# Patient Record
Sex: Female | Born: 1950 | Race: Black or African American | Hispanic: No | Marital: Single | Smoking: Never smoker
Health system: Southern US, Community
[De-identification: ages and names within clinical notes are randomized; demographics above are authoritative.]

## PROBLEM LIST (undated history)

## (undated) DIAGNOSIS — G360 Neuromyelitis optica [Devic]: Secondary | ICD-10-CM

## (undated) DIAGNOSIS — E119 Type 2 diabetes mellitus without complications: Secondary | ICD-10-CM

## (undated) DIAGNOSIS — I10 Essential (primary) hypertension: Secondary | ICD-10-CM

## (undated) DIAGNOSIS — I2699 Other pulmonary embolism without acute cor pulmonale: Secondary | ICD-10-CM

## (undated) DIAGNOSIS — M3500 Sicca syndrome, unspecified: Secondary | ICD-10-CM

## (undated) DIAGNOSIS — G8220 Paraplegia, unspecified: Secondary | ICD-10-CM

## (undated) DIAGNOSIS — G709 Myoneural disorder, unspecified: Secondary | ICD-10-CM

## (undated) HISTORY — PX: TUBAL LIGATION: SHX77

## (undated) HISTORY — PX: EYE SURGERY: SHX253

## (undated) HISTORY — PX: STOMACH SURGERY: SHX791

## (undated) HISTORY — PX: PULMONARY EMBOLISM SURGERY: SHX752

---

## 2009-08-28 ENCOUNTER — Ambulatory Visit: Payer: Self-pay | Admitting: Orthopedic Surgery

## 2011-06-10 ENCOUNTER — Ambulatory Visit: Payer: Self-pay | Admitting: General Practice

## 2011-07-17 ENCOUNTER — Ambulatory Visit: Payer: Self-pay | Admitting: General Practice

## 2011-07-28 ENCOUNTER — Ambulatory Visit: Payer: Self-pay | Admitting: General Practice

## 2011-08-10 ENCOUNTER — Ambulatory Visit: Payer: Self-pay | Admitting: Oncology

## 2011-08-15 ENCOUNTER — Ambulatory Visit: Payer: Self-pay | Admitting: Oncology

## 2011-08-22 ENCOUNTER — Ambulatory Visit: Payer: Self-pay | Admitting: Internal Medicine

## 2011-09-15 ENCOUNTER — Ambulatory Visit: Payer: Self-pay | Admitting: Oncology

## 2011-10-15 ENCOUNTER — Ambulatory Visit: Payer: Self-pay | Admitting: Oncology

## 2011-11-15 IMAGING — CT NM PET TUM IMG LTD AREA
1 of 5 series · 8 of 25 positions shown · non-contrast
Comparison: none

REASON FOR EXAM: pulmonary nodule
COMMENTS:

[Series 3: ct wb 3.0 b30f · axial · 3.0mm · 0.98mm/px · z∈[-1396,-702]mm · 8 of 435 slices shown]
[im 44/435  soft-tissue]
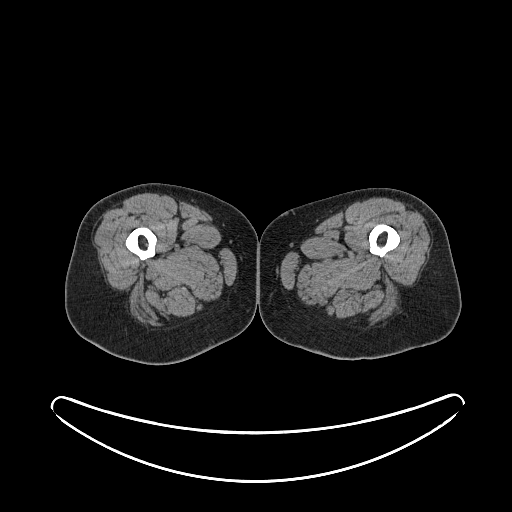
[im 87/435  soft-tissue]
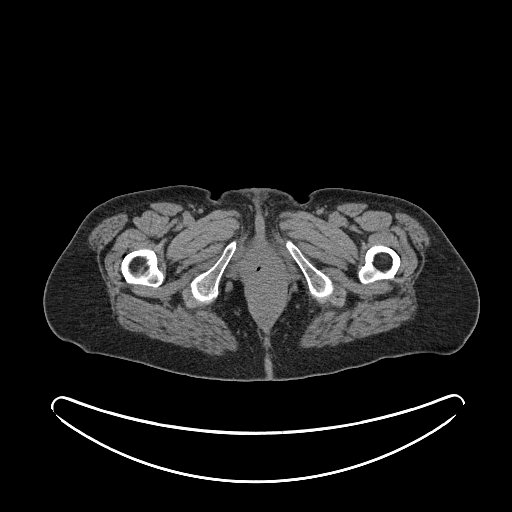
[im 131/435  soft-tissue]
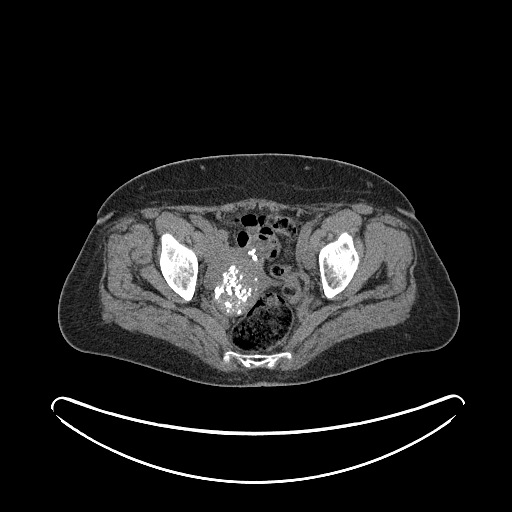
[im 174/435  soft-tissue]
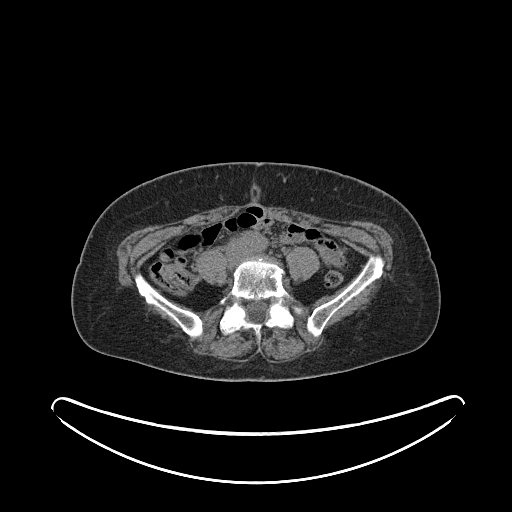
[im 218/435  soft-tissue]
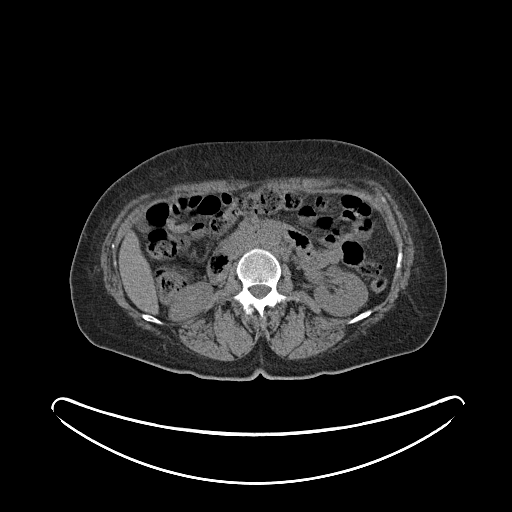
[im 261/435  soft-tissue]
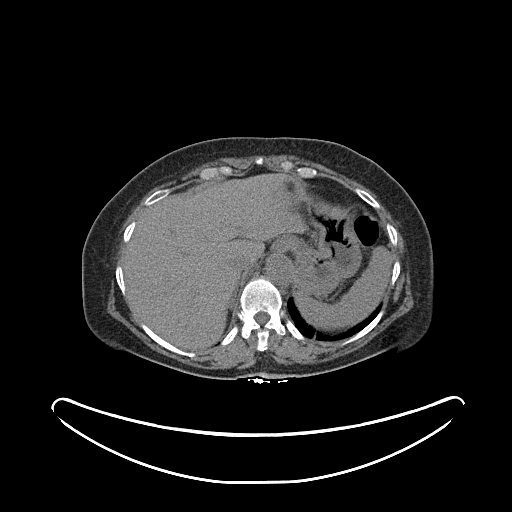
[im 304/435  soft-tissue]
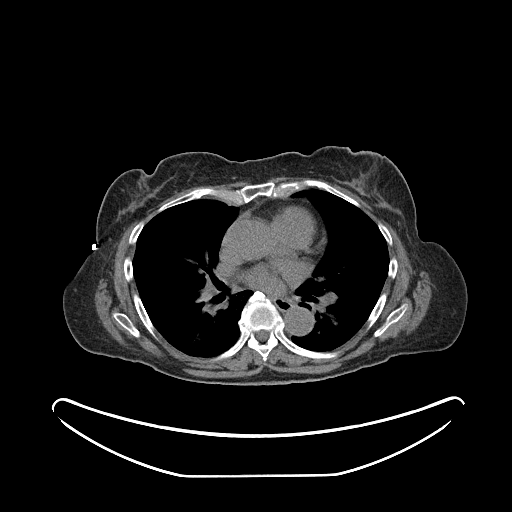
[im 391/435  soft-tissue]
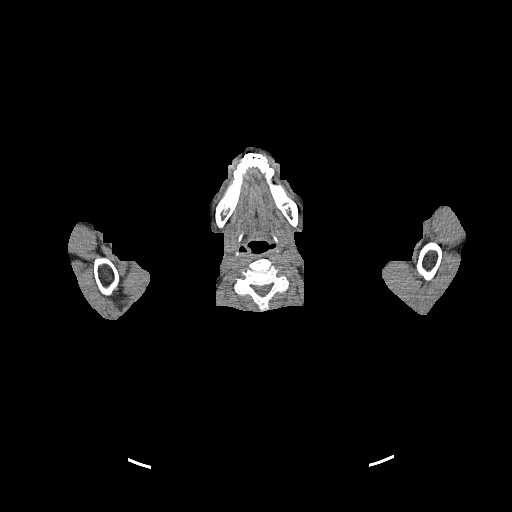

[8 of 25 positions shown; findings below may reference images not displayed]

PROCEDURE:     PET - PET/CT DX LUNG CA  - August 22, 2011  [DATE]

RESULT:     The patient has a fasting blood glucose level measuring 125
mg/dL. The patient received an injection of 12.22 mCi of fluorine-19 labeled
fluorodeoxyglucose in the left antecubital region at [DATE] a.m. with imaging
performed between the hours of [DATE] and [DATE] a.m. Correlation is made with a
previous chest CT dated 07/28/2011.

Areas of increased density in the left lower lobe, lingula, right middle
lobe and right upper lobe seen previously show increasing density in the
right lower lobe and left lower lobe and to a certain extent in the lingula
with persistent areas of ill-defined density otherwise. The right lower lobe
area shows abnormal F-18 FDG accumulation measuring 5.7 maximum SUV, within
the left lower lobe the maximum SUV is 5.73. There is less intense uptake in
the lingula without definite uptake in some of the other areas reported.
Given the fairly rapid change in appearance the suggestion is that this is
more likely an infectious or inflammatory etiology rather than malignancy.
Pulmonary and oncologic consultation would be suggested. There is asymmetric
increased localization in the left tonsillar region which should be
correlated with direct visualization for infection or malignant process.
This area demonstrates a maximum SUV of 8.0 with a mean of 4.86. Uptake is
otherwise unremarkable.
IMPRESSION: 1. Findings which could represent multifocal infectious or inflammatory
process in the lungs which has worsened since the most recent exam. There is
increasing consolidation in the right lower lobe and left lower lobe and to
a lesser extent in the lingula with persistent areas of abnormality
otherwise in the right middle lobe and right upper lobe and to a lesser
extent in the superior segment of the left lower lobe.
2. Abnormal localization in the left tonsillar region. Correlate with direct
visualization for an acute infectious process. Malignancy in this region
cannot be excluded.

## 2011-12-23 ENCOUNTER — Ambulatory Visit: Payer: Self-pay | Admitting: Oncology

## 2011-12-23 LAB — IRON AND TIBC
Iron Bind.Cap.(Total): 279 ug/dL (ref 250–450)
Iron Saturation: 14 %
Iron: 39 ug/dL — ABNORMAL LOW (ref 50–170)
Unbound Iron-Bind.Cap.: 240 ug/dL

## 2011-12-23 LAB — CBC CANCER CENTER
Basophil #: 0.1 x10 3/mm (ref 0.0–0.1)
Basophil %: 0.8 %
Eosinophil #: 0.1 x10 3/mm (ref 0.0–0.7)
Eosinophil %: 1.4 %
Lymphocyte #: 2.2 x10 3/mm (ref 1.0–3.6)
Lymphocyte %: 34.6 %
MCHC: 34.6 g/dL (ref 32.0–36.0)
Monocyte %: 16.8 %
Neutrophil #: 3 x10 3/mm (ref 1.4–6.5)
Neutrophil %: 46.4 %
WBC: 6.4 x10 3/mm (ref 3.6–11.0)

## 2012-01-13 ENCOUNTER — Ambulatory Visit: Payer: Self-pay | Admitting: Oncology

## 2012-10-22 ENCOUNTER — Emergency Department: Payer: Self-pay | Admitting: Emergency Medicine

## 2012-10-22 LAB — COMPREHENSIVE METABOLIC PANEL
Albumin: 3.7 g/dL (ref 3.4–5.0)
Alkaline Phosphatase: 84 U/L (ref 50–136)
Anion Gap: 8 (ref 7–16)
Calcium, Total: 9.2 mg/dL (ref 8.5–10.1)
Creatinine: 0.72 mg/dL (ref 0.60–1.30)
EGFR (Non-African Amer.): >60
Glucose: 86 mg/dL (ref 65–99)
Osmolality: 275 (ref 275–301)
SGOT(AST): 26 U/L (ref 15–37)
Sodium: 138 mmol/L (ref 136–145)

## 2012-10-22 LAB — URINALYSIS, COMPLETE
Hyaline Cast: 1
Protein: 30
RBC,UR: 1 /HPF (ref 0–5)
Specific Gravity: 1.023 (ref 1.003–1.030)
Squamous Epithelial: NONE SEEN
WBC UR: 1 /HPF (ref 0–5)

## 2012-10-22 LAB — CBC
MCH: 34 pg (ref 26.0–34.0)
MCHC: 34.2 g/dL (ref 32.0–36.0)
MCV: 100 fL (ref 80–100)
Platelet: 202 10*3/uL (ref 150–440)

## 2013-10-29 ENCOUNTER — Ambulatory Visit: Payer: Self-pay | Admitting: Ophthalmology

## 2014-05-05 ENCOUNTER — Encounter: Payer: Self-pay | Admitting: Family Medicine

## 2014-05-14 ENCOUNTER — Encounter: Payer: Self-pay | Admitting: Family Medicine

## 2014-06-14 ENCOUNTER — Encounter: Payer: Self-pay | Admitting: Family Medicine

## 2014-07-15 ENCOUNTER — Encounter: Payer: Self-pay | Admitting: Family Medicine

## 2014-08-14 ENCOUNTER — Encounter: Payer: Self-pay | Admitting: Family Medicine

## 2014-09-14 ENCOUNTER — Encounter: Payer: Self-pay | Admitting: Family Medicine

## 2014-10-17 ENCOUNTER — Inpatient Hospital Stay: Payer: Self-pay | Admitting: Internal Medicine

## 2014-10-17 LAB — COMPREHENSIVE METABOLIC PANEL
Albumin: 3 g/dL — ABNORMAL LOW (ref 3.4–5.0)
Alkaline Phosphatase: 58 U/L
Anion Gap: 9 (ref 7–16)
BUN: 19 mg/dL — ABNORMAL HIGH (ref 7–18)
Bilirubin,Total: 0.3 mg/dL (ref 0.2–1.0)
CHLORIDE: 109 mmol/L — AB (ref 98–107)
CO2: 27 mmol/L (ref 21–32)
Calcium, Total: 7.8 mg/dL — ABNORMAL LOW (ref 8.5–10.1)
Creatinine: 0.83 mg/dL (ref 0.60–1.30)
EGFR (African American): >60
Glucose: 114 mg/dL — ABNORMAL HIGH (ref 65–99)
Osmolality: 292 (ref 275–301)
POTASSIUM: 4.2 mmol/L (ref 3.5–5.1)
SGOT(AST): 20 U/L (ref 15–37)
SGPT (ALT): 29 U/L
SODIUM: 145 mmol/L (ref 136–145)
TOTAL PROTEIN: 4.9 g/dL — AB (ref 6.4–8.2)

## 2014-10-17 LAB — CBC
HCT: 35.2 % (ref 35.0–47.0)
HGB: 11.5 g/dL — ABNORMAL LOW (ref 12.0–16.0)
MCH: 32.5 pg (ref 26.0–34.0)
MCHC: 32.7 g/dL (ref 32.0–36.0)
MCV: 100 fL (ref 80–100)
Platelet: 86 10*3/uL — ABNORMAL LOW (ref 150–440)
RBC: 3.54 10*6/uL — AB (ref 3.80–5.20)
RDW: 15.8 % — AB (ref 11.5–14.5)
WBC: 6.2 10*3/uL (ref 3.6–11.0)

## 2014-10-17 LAB — HEMOGLOBIN: HGB: 10.4 g/dL — AB (ref 12.0–16.0)

## 2014-10-17 LAB — LIPASE, BLOOD: LIPASE: 205 U/L (ref 73–393)

## 2014-10-17 LAB — PROTIME-INR
INR: 1
Prothrombin Time: 13.5 secs (ref 11.5–14.7)

## 2014-10-18 LAB — HEMATOCRIT
HCT: 32.9 % — AB (ref 35.0–47.0)
HCT: 32.7 % — AB (ref 35.0–47.0)

## 2014-10-18 LAB — CBC WITH DIFFERENTIAL/PLATELET
BASOS ABS: 0 10*3/uL (ref 0.0–0.1)
Basophil %: 0.4 %
EOS PCT: 0.2 %
Eosinophil #: 0 10*3/uL (ref 0.0–0.7)
HCT: 29.2 % — ABNORMAL LOW (ref 35.0–47.0)
HGB: 9.5 g/dL — ABNORMAL LOW (ref 12.0–16.0)
Lymphocyte #: 1.4 10*3/uL (ref 1.0–3.6)
Lymphocyte %: 19.8 %
MCH: 32.9 pg (ref 26.0–34.0)
MCHC: 32.7 g/dL (ref 32.0–36.0)
MCV: 101 fL — ABNORMAL HIGH (ref 80–100)
MONOS PCT: 11.6 %
Monocyte #: 0.8 x10 3/mm (ref 0.2–0.9)
NEUTROS ABS: 4.9 10*3/uL (ref 1.4–6.5)
Neutrophil %: 68 %
Platelet: 72 10*3/uL — ABNORMAL LOW (ref 150–440)
RBC: 2.9 10*6/uL — AB (ref 3.80–5.20)
RDW: 15.5 % — ABNORMAL HIGH (ref 11.5–14.5)
WBC: 7.3 10*3/uL (ref 3.6–11.0)

## 2014-10-18 LAB — HEMOGLOBIN
HGB: 10.6 g/dL — ABNORMAL LOW (ref 12.0–16.0)
HGB: 10.6 g/dL — AB (ref 12.0–16.0)

## 2014-10-19 LAB — HEMOGLOBIN: HGB: 8.9 g/dL — AB (ref 12.0–16.0)

## 2014-10-19 LAB — HEMATOCRIT: HCT: 27.8 % — AB (ref 35.0–47.0)

## 2014-10-20 LAB — CBC WITH DIFFERENTIAL/PLATELET
BASOS PCT: 0.2 %
Basophil #: 0 10*3/uL (ref 0.0–0.1)
Eosinophil #: 0 10*3/uL (ref 0.0–0.7)
Eosinophil %: 0.1 %
HCT: 27 % — ABNORMAL LOW (ref 35.0–47.0)
HGB: 8.7 g/dL — ABNORMAL LOW (ref 12.0–16.0)
Lymphocyte #: 1.7 10*3/uL (ref 1.0–3.6)
Lymphocyte %: 18.1 %
MCH: 32.6 pg (ref 26.0–34.0)
MCHC: 32.3 g/dL (ref 32.0–36.0)
MCV: 101 fL — ABNORMAL HIGH (ref 80–100)
Monocyte #: 1 x10 3/mm — ABNORMAL HIGH (ref 0.2–0.9)
Monocyte %: 10.4 %
NEUTROS ABS: 6.6 10*3/uL — AB (ref 1.4–6.5)
NEUTROS PCT: 71.2 %
PLATELETS: 80 10*3/uL — AB (ref 150–440)
RBC: 2.67 10*6/uL — AB (ref 3.80–5.20)
RDW: 15.9 % — ABNORMAL HIGH (ref 11.5–14.5)
WBC: 9.2 10*3/uL (ref 3.6–11.0)

## 2014-10-20 LAB — HEMATOCRIT: HCT: 25.8 % — AB (ref 35.0–47.0)

## 2014-10-20 LAB — HEMOGLOBIN
HGB: 8.3 g/dL — ABNORMAL LOW (ref 12.0–16.0)
HGB: 9.2 g/dL — ABNORMAL LOW (ref 12.0–16.0)

## 2014-10-21 LAB — HEMOGLOBIN
HGB: 8.9 g/dL — ABNORMAL LOW (ref 12.0–16.0)
HGB: 9.7 g/dL — ABNORMAL LOW (ref 12.0–16.0)

## 2014-10-21 LAB — HEMATOCRIT
HCT: 27.1 % — AB (ref 35.0–47.0)
HCT: 30.6 % — ABNORMAL LOW (ref 35.0–47.0)

## 2014-10-22 ENCOUNTER — Inpatient Hospital Stay: Payer: Self-pay | Admitting: Internal Medicine

## 2014-10-22 LAB — COMPREHENSIVE METABOLIC PANEL
ALK PHOS: 65 U/L
ALT: 37 U/L
ANION GAP: 8 (ref 7–16)
Albumin: 3.4 g/dL (ref 3.4–5.0)
BUN: 14 mg/dL (ref 7–18)
Bilirubin,Total: 0.4 mg/dL (ref 0.2–1.0)
CALCIUM: 8.3 mg/dL — AB (ref 8.5–10.1)
CREATININE: 0.83 mg/dL (ref 0.60–1.30)
Chloride: 109 mmol/L — ABNORMAL HIGH (ref 98–107)
Co2: 30 mmol/L (ref 21–32)
EGFR (African American): >60
EGFR (Non-African Amer.): >60
Glucose: 98 mg/dL (ref 65–99)
OSMOLALITY: 293 (ref 275–301)
Potassium: 3.4 mmol/L — ABNORMAL LOW (ref 3.5–5.1)
SGOT(AST): 27 U/L (ref 15–37)
Sodium: 147 mmol/L — ABNORMAL HIGH (ref 136–145)
Total Protein: 5.8 g/dL — ABNORMAL LOW (ref 6.4–8.2)

## 2014-10-22 LAB — CBC
HCT: 33.6 % — ABNORMAL LOW (ref 35.0–47.0)
HGB: 11.2 g/dL — AB (ref 12.0–16.0)
MCH: 33.4 pg (ref 26.0–34.0)
MCHC: 33.3 g/dL (ref 32.0–36.0)
MCV: 100 fL (ref 80–100)
Platelet: 106 10*3/uL — ABNORMAL LOW (ref 150–440)
RBC: 3.35 10*6/uL — AB (ref 3.80–5.20)
RDW: 16.1 % — ABNORMAL HIGH (ref 11.5–14.5)
WBC: 7 10*3/uL (ref 3.6–11.0)

## 2014-10-22 LAB — MAGNESIUM: MAGNESIUM: 2.1 mg/dL

## 2014-10-23 LAB — CBC WITH DIFFERENTIAL/PLATELET
BASOS PCT: 0.1 %
Basophil #: 0 10*3/uL (ref 0.0–0.1)
EOS PCT: 0 %
Eosinophil #: 0 10*3/uL (ref 0.0–0.7)
HCT: 27.6 % — ABNORMAL LOW (ref 35.0–47.0)
HGB: 9.1 g/dL — AB (ref 12.0–16.0)
Lymphocyte #: 0.9 10*3/uL — ABNORMAL LOW (ref 1.0–3.6)
Lymphocyte %: 11.5 %
MCH: 33.1 pg (ref 26.0–34.0)
MCHC: 33 g/dL (ref 32.0–36.0)
MCV: 100 fL (ref 80–100)
MONO ABS: 0.4 x10 3/mm (ref 0.2–0.9)
Monocyte %: 5.3 %
NEUTROS ABS: 6.4 10*3/uL (ref 1.4–6.5)
Neutrophil %: 83.1 %
PLATELETS: 93 10*3/uL — AB (ref 150–440)
RBC: 2.75 10*6/uL — AB (ref 3.80–5.20)
RDW: 16.4 % — AB (ref 11.5–14.5)
WBC: 7.7 10*3/uL (ref 3.6–11.0)

## 2014-10-23 LAB — BASIC METABOLIC PANEL
Anion Gap: 4 — ABNORMAL LOW (ref 7–16)
BUN: 13 mg/dL (ref 7–18)
CO2: 31 mmol/L (ref 21–32)
Calcium, Total: 8.3 mg/dL — ABNORMAL LOW (ref 8.5–10.1)
Chloride: 108 mmol/L — ABNORMAL HIGH (ref 98–107)
Creatinine: 0.68 mg/dL (ref 0.60–1.30)
Glucose: 126 mg/dL — ABNORMAL HIGH (ref 65–99)
Osmolality: 287 (ref 275–301)
POTASSIUM: 4.1 mmol/L (ref 3.5–5.1)
SODIUM: 143 mmol/L (ref 136–145)

## 2014-10-23 LAB — HEMOGLOBIN
HGB: 9.1 g/dL — ABNORMAL LOW (ref 12.0–16.0)
HGB: 9.4 g/dL — ABNORMAL LOW (ref 12.0–16.0)

## 2014-10-24 LAB — CBC WITH DIFFERENTIAL/PLATELET
Basophil #: 0 10*3/uL (ref 0.0–0.1)
Basophil %: 0.1 %
EOS ABS: 0 10*3/uL (ref 0.0–0.7)
Eosinophil %: 0.3 %
HCT: 28.9 % — ABNORMAL LOW (ref 35.0–47.0)
HGB: 9.5 g/dL — AB (ref 12.0–16.0)
Lymphocyte #: 1.4 10*3/uL (ref 1.0–3.6)
Lymphocyte %: 18.5 %
MCH: 33 pg (ref 26.0–34.0)
MCHC: 32.7 g/dL (ref 32.0–36.0)
MCV: 101 fL — AB (ref 80–100)
Monocyte #: 0.9 x10 3/mm (ref 0.2–0.9)
Monocyte %: 11.7 %
NEUTROS PCT: 69.4 %
Neutrophil #: 5.3 10*3/uL (ref 1.4–6.5)
Platelet: 108 10*3/uL — ABNORMAL LOW (ref 150–440)
RBC: 2.87 10*6/uL — ABNORMAL LOW (ref 3.80–5.20)
RDW: 16.7 % — ABNORMAL HIGH (ref 11.5–14.5)
WBC: 7.7 10*3/uL (ref 3.6–11.0)

## 2014-10-24 LAB — HEMOGLOBIN
HGB: 9.1 g/dL — AB (ref 12.0–16.0)
HGB: 10 g/dL — AB (ref 12.0–16.0)

## 2014-10-24 LAB — BASIC METABOLIC PANEL
Anion Gap: 6 — ABNORMAL LOW (ref 7–16)
BUN: 10 mg/dL (ref 7–18)
Calcium, Total: 8.2 mg/dL — ABNORMAL LOW (ref 8.5–10.1)
Chloride: 109 mmol/L — ABNORMAL HIGH (ref 98–107)
Co2: 31 mmol/L (ref 21–32)
Creatinine: 0.78 mg/dL (ref 0.60–1.30)
Glucose: 88 mg/dL (ref 65–99)
OSMOLALITY: 289 (ref 275–301)
Potassium: 3.6 mmol/L (ref 3.5–5.1)
Sodium: 146 mmol/L — ABNORMAL HIGH (ref 136–145)

## 2014-10-24 LAB — HEMATOCRIT
HCT: 28.5 % — ABNORMAL LOW (ref 35.0–47.0)
HCT: 31.1 % — ABNORMAL LOW (ref 35.0–47.0)

## 2014-10-25 LAB — CBC WITH DIFFERENTIAL/PLATELET
BASOS ABS: 0 10*3/uL (ref 0.0–0.1)
Basophil %: 0.2 %
EOS PCT: 0.2 %
Eosinophil #: 0 10*3/uL (ref 0.0–0.7)
HCT: 26.8 % — AB (ref 35.0–47.0)
HGB: 8.8 g/dL — ABNORMAL LOW (ref 12.0–16.0)
LYMPHS ABS: 1.3 10*3/uL (ref 1.0–3.6)
Lymphocyte %: 19.9 %
MCH: 33.1 pg (ref 26.0–34.0)
MCHC: 32.9 g/dL (ref 32.0–36.0)
MCV: 101 fL — ABNORMAL HIGH (ref 80–100)
Monocyte #: 0.6 x10 3/mm (ref 0.2–0.9)
Monocyte %: 10 %
NEUTROS ABS: 4.5 10*3/uL (ref 1.4–6.5)
Neutrophil %: 69.7 %
Platelet: 105 10*3/uL — ABNORMAL LOW (ref 150–440)
RBC: 2.66 10*6/uL — ABNORMAL LOW (ref 3.80–5.20)
RDW: 16.7 % — ABNORMAL HIGH (ref 11.5–14.5)
WBC: 6.5 10*3/uL (ref 3.6–11.0)

## 2014-10-25 LAB — BASIC METABOLIC PANEL
Anion Gap: 5 — ABNORMAL LOW (ref 7–16)
BUN: 6 mg/dL — ABNORMAL LOW (ref 7–18)
Calcium, Total: 7.5 mg/dL — ABNORMAL LOW (ref 8.5–10.1)
Chloride: 114 mmol/L — ABNORMAL HIGH (ref 98–107)
Co2: 28 mmol/L (ref 21–32)
Creatinine: 0.72 mg/dL (ref 0.60–1.30)
EGFR (African American): >60
EGFR (Non-African Amer.): >60
Glucose: 85 mg/dL (ref 65–99)
Osmolality: 289 (ref 275–301)
Potassium: 3.6 mmol/L (ref 3.5–5.1)
Sodium: 147 mmol/L — ABNORMAL HIGH (ref 136–145)

## 2014-10-26 LAB — HEMOGLOBIN: HGB: 9.4 g/dL — ABNORMAL LOW (ref 12.0–16.0)

## 2014-12-12 ENCOUNTER — Inpatient Hospital Stay: Payer: Self-pay | Admitting: Internal Medicine

## 2014-12-12 LAB — CBC WITH DIFFERENTIAL/PLATELET
Basophil #: 0.1 10*3/uL (ref 0.0–0.1)
Basophil %: 0.6 %
EOS PCT: 0 %
Eosinophil #: 0 10*3/uL (ref 0.0–0.7)
HCT: 43.4 % (ref 35.0–47.0)
HGB: 14.3 g/dL (ref 12.0–16.0)
Lymphocyte #: 0.4 10*3/uL — ABNORMAL LOW (ref 1.0–3.6)
Lymphocyte %: 4 %
MCH: 32.7 pg (ref 26.0–34.0)
MCHC: 33 g/dL (ref 32.0–36.0)
MCV: 99 fL (ref 80–100)
Monocyte #: 0.5 x10 3/mm (ref 0.2–0.9)
Monocyte %: 4.5 %
NEUTROS PCT: 90.9 %
Neutrophil #: 10.1 10*3/uL — ABNORMAL HIGH (ref 1.4–6.5)
Platelet: 150 10*3/uL (ref 150–440)
RBC: 4.37 10*6/uL (ref 3.80–5.20)
RDW: 14.4 % (ref 11.5–14.5)
WBC: 11.1 10*3/uL — AB (ref 3.6–11.0)

## 2014-12-12 LAB — CBC
HCT: 31.9 % — AB (ref 35.0–47.0)
HGB: 10.6 g/dL — ABNORMAL LOW (ref 12.0–16.0)
MCH: 33 pg (ref 26.0–34.0)
MCHC: 33.1 g/dL (ref 32.0–36.0)
MCV: 100 fL (ref 80–100)
Platelet: 85 10*3/uL — ABNORMAL LOW (ref 150–440)
RBC: 3.21 10*6/uL — ABNORMAL LOW (ref 3.80–5.20)
RDW: 13.9 % (ref 11.5–14.5)
WBC: 8.9 10*3/uL (ref 3.6–11.0)

## 2014-12-12 LAB — COMPREHENSIVE METABOLIC PANEL
ALBUMIN: 3 g/dL — AB (ref 3.4–5.0)
ALK PHOS: 116 U/L (ref 46–116)
AST: 41 U/L — AB (ref 15–37)
Anion Gap: 7 (ref 7–16)
BILIRUBIN TOTAL: 0.4 mg/dL (ref 0.2–1.0)
BUN: 35 mg/dL — AB (ref 7–18)
CHLORIDE: 107 mmol/L (ref 98–107)
CO2: 28 mmol/L (ref 21–32)
Calcium, Total: 8.7 mg/dL (ref 8.5–10.1)
Creatinine: 1 mg/dL (ref 0.60–1.30)
EGFR (Non-African Amer.): 60 — ABNORMAL LOW
Glucose: 388 mg/dL — ABNORMAL HIGH (ref 65–99)
Osmolality: 307 (ref 275–301)
Potassium: 4.7 mmol/L (ref 3.5–5.1)
SGPT (ALT): 60 U/L (ref 14–63)
Sodium: 142 mmol/L (ref 136–145)
TOTAL PROTEIN: 6.1 g/dL — AB (ref 6.4–8.2)

## 2014-12-12 LAB — URINALYSIS, COMPLETE
BACTERIA: NONE SEEN
BLOOD: NEGATIVE
Bilirubin,UR: NEGATIVE
KETONE: NEGATIVE
NITRITE: NEGATIVE
Ph: 5 (ref 4.5–8.0)
Protein: NEGATIVE
RBC,UR: 9 /HPF (ref 0–5)
SPECIFIC GRAVITY: 1.025 (ref 1.003–1.030)
Squamous Epithelial: NONE SEEN
WBC UR: 11 /HPF (ref 0–5)

## 2014-12-12 LAB — TROPONIN I: TROPONIN-I: 0.14 ng/mL — AB

## 2014-12-12 LAB — APTT: Activated PTT: 23 secs — ABNORMAL LOW (ref 23.6–35.9)

## 2014-12-12 LAB — PROTIME-INR
INR: 1
PROTHROMBIN TIME: 12.7 s (ref 11.5–14.7)

## 2014-12-12 LAB — CK-MB: CK-MB: 8 ng/mL — AB (ref 0.5–3.6)

## 2014-12-13 LAB — TSH: Thyroid Stimulating Horm: 0.11 u[IU]/mL — ABNORMAL LOW

## 2014-12-13 LAB — HEMOGLOBIN A1C: Hemoglobin A1C: 6.9 % — ABNORMAL HIGH (ref 4.2–6.3)

## 2014-12-13 LAB — CK-MB
CK-MB: 7.1 ng/mL — ABNORMAL HIGH (ref 0.5–3.6)
CK-MB: 8.4 ng/mL — ABNORMAL HIGH (ref 0.5–3.6)

## 2014-12-13 LAB — TROPONIN I
Troponin-I: 0.11 ng/mL — ABNORMAL HIGH
Troponin-I: 0.12 ng/mL — ABNORMAL HIGH

## 2014-12-13 LAB — CBC WITH DIFFERENTIAL/PLATELET
BASOS PCT: 0.6 %
Basophil #: 0.1 10*3/uL (ref 0.0–0.1)
EOS ABS: 0 10*3/uL (ref 0.0–0.7)
EOS PCT: 0.2 %
HCT: 36.4 % (ref 35.0–47.0)
HGB: 12.1 g/dL (ref 12.0–16.0)
LYMPHS ABS: 1.1 10*3/uL (ref 1.0–3.6)
Lymphocyte %: 10.3 %
MCH: 31 pg (ref 26.0–34.0)
MCHC: 33.2 g/dL (ref 32.0–36.0)
MCV: 93 fL (ref 80–100)
MONO ABS: 0.8 x10 3/mm (ref 0.2–0.9)
Monocyte %: 8.1 %
NEUTROS ABS: 8.4 10*3/uL — AB (ref 1.4–6.5)
Neutrophil %: 80.8 %
Platelet: 67 10*3/uL — ABNORMAL LOW (ref 150–440)
RBC: 3.9 10*6/uL (ref 3.80–5.20)
RDW: 17.2 % — ABNORMAL HIGH (ref 11.5–14.5)
WBC: 10.4 10*3/uL (ref 3.6–11.0)

## 2014-12-13 LAB — HEMOGLOBIN: HGB: 12.4 g/dL (ref 12.0–16.0)

## 2014-12-14 LAB — T4, FREE: Free Thyroxine: 0.64 ng/dL — ABNORMAL LOW (ref 0.76–1.46)

## 2014-12-14 LAB — CBC WITH DIFFERENTIAL/PLATELET
BASOS ABS: 0 10*3/uL (ref 0.0–0.1)
Basophil %: 0.5 %
EOS ABS: 0 10*3/uL (ref 0.0–0.7)
Eosinophil %: 0.1 %
HCT: 33.3 % — ABNORMAL LOW (ref 35.0–47.0)
HGB: 11.1 g/dL — ABNORMAL LOW (ref 12.0–16.0)
LYMPHS ABS: 1.1 10*3/uL (ref 1.0–3.6)
Lymphocyte %: 12.1 %
MCH: 31 pg (ref 26.0–34.0)
MCHC: 33.3 g/dL (ref 32.0–36.0)
MCV: 93 fL (ref 80–100)
MONO ABS: 0.7 x10 3/mm (ref 0.2–0.9)
Monocyte %: 7.6 %
Neutrophil #: 7.5 10*3/uL — ABNORMAL HIGH (ref 1.4–6.5)
Neutrophil %: 79.7 %
PLATELETS: 58 10*3/uL — AB (ref 150–440)
RBC: 3.57 10*6/uL — ABNORMAL LOW (ref 3.80–5.20)
RDW: 17.3 % — AB (ref 11.5–14.5)
WBC: 9.4 10*3/uL (ref 3.6–11.0)

## 2014-12-14 LAB — BASIC METABOLIC PANEL
Anion Gap: 8 (ref 7–16)
BUN: 17 mg/dL (ref 7–18)
CHLORIDE: 112 mmol/L — AB (ref 98–107)
CO2: 24 mmol/L (ref 21–32)
CREATININE: 0.62 mg/dL (ref 0.60–1.30)
Calcium, Total: 7.7 mg/dL — ABNORMAL LOW (ref 8.5–10.1)
EGFR (African American): >60
EGFR (Non-African Amer.): >60
Glucose: 144 mg/dL — ABNORMAL HIGH (ref 65–99)
Osmolality: 291 (ref 275–301)
Potassium: 4.1 mmol/L (ref 3.5–5.1)
Sodium: 144 mmol/L (ref 136–145)

## 2014-12-15 LAB — BASIC METABOLIC PANEL
ANION GAP: 6 — AB (ref 7–16)
BUN: 15 mg/dL (ref 7–18)
CO2: 25 mmol/L (ref 21–32)
Calcium, Total: 7.5 mg/dL — ABNORMAL LOW (ref 8.5–10.1)
Chloride: 112 mmol/L — ABNORMAL HIGH (ref 98–107)
Creatinine: 0.67 mg/dL (ref 0.60–1.30)
EGFR (African American): >60
EGFR (Non-African Amer.): >60
Glucose: 207 mg/dL — ABNORMAL HIGH (ref 65–99)
Osmolality: 292 (ref 275–301)
Potassium: 3.5 mmol/L (ref 3.5–5.1)
Sodium: 143 mmol/L (ref 136–145)

## 2014-12-15 LAB — URINALYSIS, COMPLETE
Bilirubin,UR: NEGATIVE
Glucose,UR: NEGATIVE mg/dL (ref 0–75)
Ketone: NEGATIVE
Nitrite: NEGATIVE
Ph: 5 (ref 4.5–8.0)
Protein: NEGATIVE
RBC,UR: 18 /HPF (ref 0–5)
Specific Gravity: 1.008 (ref 1.003–1.030)
Squamous Epithelial: 1
WBC UR: 3 /HPF (ref 0–5)

## 2014-12-15 LAB — CBC WITH DIFFERENTIAL/PLATELET
BASOS ABS: 0 10*3/uL (ref 0.0–0.1)
Basophil %: 0.2 %
Eosinophil #: 0 10*3/uL (ref 0.0–0.7)
Eosinophil %: 0.4 %
HCT: 31.1 % — AB (ref 35.0–47.0)
HGB: 10.4 g/dL — ABNORMAL LOW (ref 12.0–16.0)
LYMPHS ABS: 1.3 10*3/uL (ref 1.0–3.6)
LYMPHS PCT: 17.2 %
MCH: 30.8 pg (ref 26.0–34.0)
MCHC: 33.3 g/dL (ref 32.0–36.0)
MCV: 93 fL (ref 80–100)
MONOS PCT: 7 %
Monocyte #: 0.5 x10 3/mm (ref 0.2–0.9)
NEUTROS ABS: 5.8 10*3/uL (ref 1.4–6.5)
Neutrophil %: 75.2 %
PLATELETS: 61 10*3/uL — AB (ref 150–440)
RBC: 3.37 10*6/uL — AB (ref 3.80–5.20)
RDW: 16.2 % — AB (ref 11.5–14.5)
WBC: 7.7 10*3/uL (ref 3.6–11.0)

## 2014-12-17 ENCOUNTER — Encounter: Payer: Self-pay | Admitting: Family Medicine

## 2015-01-08 ENCOUNTER — Inpatient Hospital Stay (HOSPITAL_COMMUNITY)
Admission: AD | Admit: 2015-01-08 | Discharge: 2015-01-12 | DRG: 378 | Disposition: A | Discharge status: Home / Self Care | Payer: Medicare (Managed Care) | Source: Other Acute Inpatient Hospital | Attending: Internal Medicine | Admitting: Internal Medicine

## 2015-01-08 ENCOUNTER — Encounter (HOSPITAL_COMMUNITY): Payer: Self-pay | Admitting: Internal Medicine

## 2015-01-08 ENCOUNTER — Emergency Department: Payer: Self-pay | Admitting: Emergency Medicine

## 2015-01-08 DIAGNOSIS — I272 Other secondary pulmonary hypertension: Secondary | ICD-10-CM | POA: Diagnosis present

## 2015-01-08 DIAGNOSIS — I5032 Chronic diastolic (congestive) heart failure: Secondary | ICD-10-CM | POA: Diagnosis present

## 2015-01-08 DIAGNOSIS — K592 Neurogenic bowel, not elsewhere classified: Secondary | ICD-10-CM | POA: Diagnosis present

## 2015-01-08 DIAGNOSIS — G360 Neuromyelitis optica [Devic]: Secondary | ICD-10-CM | POA: Diagnosis present

## 2015-01-08 DIAGNOSIS — K047 Periapical abscess without sinus: Secondary | ICD-10-CM | POA: Diagnosis present

## 2015-01-08 DIAGNOSIS — K2971 Gastritis, unspecified, with bleeding: Secondary | ICD-10-CM

## 2015-01-08 DIAGNOSIS — M3500 Sicca syndrome, unspecified: Secondary | ICD-10-CM | POA: Diagnosis present

## 2015-01-08 DIAGNOSIS — D62 Acute posthemorrhagic anemia: Secondary | ICD-10-CM | POA: Diagnosis present

## 2015-01-08 DIAGNOSIS — R000 Tachycardia, unspecified: Secondary | ICD-10-CM | POA: Diagnosis present

## 2015-01-08 DIAGNOSIS — E119 Type 2 diabetes mellitus without complications: Secondary | ICD-10-CM

## 2015-01-08 DIAGNOSIS — L89151 Pressure ulcer of sacral region, stage 1: Secondary | ICD-10-CM | POA: Diagnosis present

## 2015-01-08 DIAGNOSIS — G8220 Paraplegia, unspecified: Secondary | ICD-10-CM | POA: Diagnosis present

## 2015-01-08 DIAGNOSIS — K5731 Diverticulosis of large intestine without perforation or abscess with bleeding: Principal | ICD-10-CM | POA: Diagnosis present

## 2015-01-08 DIAGNOSIS — H5441 Blindness, right eye, normal vision left eye: Secondary | ICD-10-CM | POA: Diagnosis present

## 2015-01-08 DIAGNOSIS — I517 Cardiomegaly: Secondary | ICD-10-CM | POA: Diagnosis present

## 2015-01-08 DIAGNOSIS — E876 Hypokalemia: Secondary | ICD-10-CM | POA: Diagnosis present

## 2015-01-08 DIAGNOSIS — I10 Essential (primary) hypertension: Secondary | ICD-10-CM | POA: Diagnosis present

## 2015-01-08 DIAGNOSIS — K625 Hemorrhage of anus and rectum: Secondary | ICD-10-CM | POA: Diagnosis present

## 2015-01-08 DIAGNOSIS — Z7952 Long term (current) use of systemic steroids: Secondary | ICD-10-CM

## 2015-01-08 DIAGNOSIS — L8990 Pressure ulcer of unspecified site, unspecified stage: Secondary | ICD-10-CM | POA: Diagnosis present

## 2015-01-08 DIAGNOSIS — L8991 Pressure ulcer of unspecified site, stage 1: Secondary | ICD-10-CM | POA: Diagnosis present

## 2015-01-08 DIAGNOSIS — I509 Heart failure, unspecified: Secondary | ICD-10-CM | POA: Diagnosis not present

## 2015-01-08 DIAGNOSIS — K922 Gastrointestinal hemorrhage, unspecified: Secondary | ICD-10-CM | POA: Diagnosis present

## 2015-01-08 HISTORY — DX: Myoneural disorder, unspecified: G70.9

## 2015-01-08 HISTORY — DX: Type 2 diabetes mellitus without complications: E11.9

## 2015-01-08 HISTORY — DX: Neuromyelitis optica (devic): G36.0

## 2015-01-08 HISTORY — DX: Sjogren syndrome, unspecified: M35.00

## 2015-01-08 HISTORY — DX: Paraplegia, unspecified: G82.20

## 2015-01-08 HISTORY — DX: Essential (primary) hypertension: I10

## 2015-01-08 LAB — MRSA PCR SCREENING: MRSA by PCR: NEGATIVE

## 2015-01-08 MED ORDER — SODIUM CHLORIDE 0.9 % IV BOLUS (SEPSIS)
1000.0000 mL | Freq: Once | INTRAVENOUS | Status: AC
  Administered 2015-01-08: 1000 mL via INTRAVENOUS

## 2015-01-08 MED ORDER — ACETAMINOPHEN 325 MG PO TABS: 650.0000 mg | ORAL_TABLET | Freq: Four times a day (QID) | ORAL | Status: DC | PRN

## 2015-01-08 MED ORDER — SODIUM CHLORIDE 0.9 % IV SOLN
INTRAVENOUS | Status: AC
  Administered 2015-01-09 (×2): via INTRAVENOUS

## 2015-01-08 MED ORDER — INSULIN ASPART 100 UNIT/ML ~~LOC~~ SOLN
0.0000 [IU] | SUBCUTANEOUS | Status: DC
  Administered 2015-01-09: 3 [IU] via SUBCUTANEOUS
  Administered 2015-01-09: 1 [IU] via SUBCUTANEOUS
  Administered 2015-01-09: 2 [IU] via SUBCUTANEOUS
  Administered 2015-01-10 – 2015-01-12 (×6): 1 [IU] via SUBCUTANEOUS
  Administered 2015-01-12 (×2): 3 [IU] via SUBCUTANEOUS

## 2015-01-08 MED ORDER — ONDANSETRON HCL 4 MG/2ML IJ SOLN: 4.0000 mg | Freq: Four times a day (QID) | INTRAMUSCULAR | Status: DC | PRN

## 2015-01-08 MED ORDER — ONDANSETRON HCL 4 MG PO TABS: 4.0000 mg | ORAL_TABLET | Freq: Four times a day (QID) | ORAL | Status: DC | PRN

## 2015-01-08 MED ORDER — SODIUM CHLORIDE 0.9 % IV SOLN
Freq: Once | INTRAVENOUS | Status: AC
  Administered 2015-01-09: 07:00:00 via INTRAVENOUS

## 2015-01-08 MED ORDER — HYDROCORTISONE NA SUCCINATE PF 100 MG IJ SOLR
50.0000 mg | Freq: Four times a day (QID) | INTRAMUSCULAR | Status: DC
  Administered 2015-01-09 – 2015-01-12 (×14): 50 mg via INTRAVENOUS
  Filled 2015-01-08 (×20): qty 1

## 2015-01-08 MED ORDER — ACETAMINOPHEN 650 MG RE SUPP: 650.0000 mg | Freq: Four times a day (QID) | RECTAL | Status: DC | PRN

## 2015-01-08 NOTE — Progress Notes (Signed)
2000 pt arrived via care link. Transferred from then streacher to the bed. Pt was found to have had a lg bm that was frank red blood with clots. Pt was cleaned and assessed and connected to the monitors. Pt HR is in the 130's.  MD paged and made aware of pt current status. Will continue to monitor pt. Family updated.

## 2015-01-08 NOTE — H&P (Signed)
[facility] History and Physical  Lorie Cleckley ZOX:[id number] DOB: [birth year] DOA: 01/08/2015  Referring physician: Patient was transferred from [facility]. PCP: No primary care provider on file.   Chief Complaint: Rectal bleeding.  HPI: Susan Shepherd is a 64 y.o. female who was recently admitted at [facility] 2 weeks ago for rectal bleeding was found to have diverticular bleeding and eventually was needing embolization of the artery to stop the bleeding. Patient was doing fine after that and patient started developing bleeding again today morning. In the ER at [facility] patient was found to have a large bloody bowel movement and was placed on 1 unit of packed red blood cell transfusion. Patient's hemoglobin at the time was 10 and patient was transferred to [facility] because of lack of beds. Patient to pediatric hemoglobin after transfusion was 7. Patient has had at least 3 large bloody bowel movement over a year. Patient is tachycardic. Patient denies any nausea vomiting abdominal pain fever chills. Patient was recently diagnosed with tooth abscess of the right side and was placed on antibiotics since yesterday. I did discuss the patient's primary care physician who called in. As per the patient's primary care physician patient has been having tachycardia cause of which was not clear and recent 2-D echo done showed EF of 30%. 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   Review of Systems: As presented in the history of presenting illness, rest negative.  Past Medical History  Diagnosis Date  . Neuromuscular disorder   . Neuromyelitis optica   . Diabetes mellitus without complication   . Hypertension   . Paraplegia   . Sjogren's syndrome    Past Surgical History  Procedure Laterality Date  . Eye surgery     Social History:  reports that she has never smoked. She does not  have any smokeless tobacco history on file. She reports that she does not drink alcohol. Her drug history is not on file. Where does patient live home. Can patient participate in ADLs? No.  Not on File  Family History:  Family History  Problem Relation Age of Onset  . Adopted: Yes      Prior to Admission medications   Not on File    Physical Exam: Filed Vitals:   01/08/15 2200  BP: 112/83  Pulse: 134  Temp: 99.2 F (37.3 C)  TempSrc: Oral  Resp: 19  Height:  (1.6 m)  Weight: 91.3 kg (201 lb 4.5 oz)  SpO2: 99%     General:  Moderately built and nourished.  Eyes: Anicteric no pallor.  ENT: No discharge from the ears eyes nose and mouth.  Neck: No mass felt.  Cardiovascular: S1 and S2 are tachycardic.  Respiratory: No rhonchi or crepitations.  Abdomen: Soft nontender bowel sounds present.  Skin: No rash.  Musculoskeletal: No edema.  Psychiatric: Appears normal.  Neurologic: Alert awake oriented to time place and person. Patient is paraplegic.  Labs on Admission:  Basic Metabolic Panel: No results for input(s): NA, K, CL, CO2, GLUCOSE, BUN, CREATININE, CALCIUM, MG, PHOS in the last 168 hours. Liver Function Tests: No results for input(s): AST, ALT, ALKPHOS, BILITOT, PROT, ALBUMIN in the last 168 hours. No results for input(s): LIPASE, AMYLASE in the last 168 hours. No results for input(s): AMMONIA in the last 168 hours. CBC: No results for input(s): WBC, NEUTROABS, HGB, HCT, MCV, PLT in the last 168 hours. Cardiac Enzymes: No results  for input(s): CKTOTAL, CKMB, CKMBINDEX, TROPONINI in the last 168 hours.  BNP (last 3 results) No results for input(s): BNP in the last 8760 hours.  ProBNP (last 3 results) No results for input(s): PROBNP in the last 8760 hours.  CBG: No results for input(s): GLUCAP in the last 168 hours.  Radiological Exams on Admission: No results found.   Assessment/Plan Principal Problem:   Acute GI bleeding Active  Problems:   Acute blood loss anemia   Neuromyelitis optica   Essential hypertension   Diabetes mellitus type 2, controlled   1. Acute GI bleed most likely a diverticular - patient has had at least 3 large bloody bowel movements now. I have discussed with on-call gastroenterologist Dr. Matthias HughsBuccini was advised to get a bleeding scan stat and I have discussed with Dr. Andria MeuseStevens radiologist and ordered the bleeding scan. Dr.Buccini has also advised to get surgery consult if patient continues to bleed and I have also consulted Dr. Andrey CampanileWilson on call surgeon. Since patient is having ongoing bleed I have discussed with on-call pulmonary critical care Dr. Bard HerbertSimmonds and patient will be transferred to ICU for further care. Since patient ongoing bleeding I have ordered metabolic panel CBC stat and also transfusion of 2 units of PRBC. Patient will be kept nothing by mouth. 2. Neuromyelitis optica with paraplegia - since patient is nothing by mouth and was on steroids. I have placed patient on stress dose steroids. 3. Acute blood loss anemia - follow CBC. 4. Diabetes mellitus type 2 - patient usually takes metformin. Patient has been placed on sliding scale coverage. 5. Hypertension - presently hold antihypertensives due to acute GI bleed. 6. Recently diagnosed tooth abscess - patient has mild swelling of the right side of the face. I have placed patient on Unasyn. 7. LV dysfunction with EF of 30% as per patient's primary care physician - closely monitor patient's respiratory status patient is getting aggressive hydration and PRBC transfusion.   DVT Prophylaxis SCDs.  Code Status: Full code.  Family Communication: Patient's family at the bedside.  Disposition Plan: Admit to inpatient.    [name] N. [facility] Pager [phone].  If 7PM-7AM, please contact night-coverage [email or web address] Password [email or web address] 01/08/2015, 11:39 PM

## 2015-01-09 ENCOUNTER — Inpatient Hospital Stay (HOSPITAL_COMMUNITY): Payer: Medicare (Managed Care)

## 2015-01-09 DIAGNOSIS — L8991 Pressure ulcer of unspecified site, stage 1: Secondary | ICD-10-CM

## 2015-01-09 DIAGNOSIS — I5032 Chronic diastolic (congestive) heart failure: Secondary | ICD-10-CM

## 2015-01-09 DIAGNOSIS — K922 Gastrointestinal hemorrhage, unspecified: Secondary | ICD-10-CM

## 2015-01-09 DIAGNOSIS — D62 Acute posthemorrhagic anemia: Secondary | ICD-10-CM

## 2015-01-09 LAB — COMPREHENSIVE METABOLIC PANEL
ALT: 14 U/L (ref 0–35)
ALT: 17 U/L (ref 0–35)
ANION GAP: 5 (ref 5–15)
AST: 13 U/L (ref 0–37)
AST: 19 U/L (ref 0–37)
Albumin: 2.2 g/dL — ABNORMAL LOW (ref 3.5–5.2)
Albumin: 2.6 g/dL — ABNORMAL LOW (ref 3.5–5.2)
Alkaline Phosphatase: 37 U/L — ABNORMAL LOW (ref 39–117)
Alkaline Phosphatase: 39 U/L (ref 39–117)
Anion gap: 5 (ref 5–15)
BILIRUBIN TOTAL: 1 mg/dL (ref 0.3–1.2)
BUN: 13 mg/dL (ref 6–23)
BUN: 14 mg/dL (ref 6–23)
CALCIUM: 6.9 mg/dL — AB (ref 8.4–10.5)
CHLORIDE: 114 mmol/L — AB (ref 96–112)
CO2: 23 mmol/L (ref 19–32)
CO2: 26 mmol/L (ref 19–32)
CREATININE: 0.55 mg/dL (ref 0.50–1.10)
CREATININE: 0.65 mg/dL (ref 0.50–1.10)
Calcium: 7.2 mg/dL — ABNORMAL LOW (ref 8.4–10.5)
Chloride: 112 mmol/L (ref 96–112)
GFR calc Af Amer: >90 mL/min (ref >90)
Glucose, Bld: 160 mg/dL — ABNORMAL HIGH (ref 70–99)
Glucose, Bld: 119 mg/dL — ABNORMAL HIGH (ref 70–99)
Potassium: 4 mmol/L (ref 3.5–5.1)
Potassium: 3.4 mmol/L — ABNORMAL LOW (ref 3.5–5.1)
Sodium: 142 mmol/L (ref 135–145)
Sodium: 143 mmol/L (ref 135–145)
TOTAL PROTEIN: 3.8 g/dL — AB (ref 6.0–8.3)
Total Bilirubin: 0.7 mg/dL (ref 0.3–1.2)
Total Protein: 4.7 g/dL — ABNORMAL LOW (ref 6.0–8.3)

## 2015-01-09 LAB — ABO/RH: ABO/RH(D): O POS

## 2015-01-09 LAB — GLUCOSE, CAPILLARY
GLUCOSE-CAPILLARY: 121 mg/dL — AB (ref 70–99)
GLUCOSE-CAPILLARY: 140 mg/dL — AB (ref 70–99)
GLUCOSE-CAPILLARY: 163 mg/dL — AB (ref 70–99)
GLUCOSE-CAPILLARY: 209 mg/dL — AB (ref 70–99)
Glucose-Capillary: 115 mg/dL — ABNORMAL HIGH (ref 70–99)
Glucose-Capillary: 86 mg/dL (ref 70–99)

## 2015-01-09 LAB — CBC
HCT: 22.3 % — ABNORMAL LOW (ref 36.0–46.0)
HEMOGLOBIN: 7.1 g/dL — AB (ref 12.0–15.0)
MCH: 30.1 pg (ref 26.0–34.0)
MCHC: 31.8 g/dL (ref 30.0–36.0)
MCV: 94.5 fL (ref 78.0–100.0)
Platelets: 151 10*3/uL (ref 150–400)
RBC: 2.36 MIL/uL — AB (ref 3.87–5.11)
RDW: 17.2 % — ABNORMAL HIGH (ref 11.5–15.5)
WBC: 10.9 10*3/uL — AB (ref 4.0–10.5)

## 2015-01-09 LAB — CBC WITH DIFFERENTIAL/PLATELET
BASOS PCT: 0 % (ref 0–1)
Basophils Absolute: 0 10*3/uL (ref 0.0–0.1)
EOS ABS: 0 10*3/uL (ref 0.0–0.7)
EOS PCT: 0 % (ref 0–5)
HEMATOCRIT: 28.8 % — AB (ref 36.0–46.0)
Hemoglobin: 9.8 g/dL — ABNORMAL LOW (ref 12.0–15.0)
LYMPHS ABS: 1.6 10*3/uL (ref 0.7–4.0)
Lymphocytes Relative: 14 % (ref 12–46)
MCH: 31.8 pg (ref 26.0–34.0)
MCHC: 34 g/dL (ref 30.0–36.0)
MCV: 93.5 fL (ref 78.0–100.0)
MONOS PCT: 12 % (ref 3–12)
Monocytes Absolute: 1.4 10*3/uL — ABNORMAL HIGH (ref 0.1–1.0)
Neutro Abs: 8.3 10*3/uL — ABNORMAL HIGH (ref 1.7–7.7)
Neutrophils Relative %: 74 % (ref 43–77)
Platelets: 133 10*3/uL — ABNORMAL LOW (ref 150–400)
RBC: 3.08 MIL/uL — ABNORMAL LOW (ref 3.87–5.11)
RDW: 15.4 % (ref 11.5–15.5)
WBC: 11.2 10*3/uL — AB (ref 4.0–10.5)

## 2015-01-09 LAB — PROTIME-INR
INR: 1.15 (ref 0.00–1.49)
Prothrombin Time: 14.8 seconds (ref 11.6–15.2)

## 2015-01-09 LAB — PREPARE RBC (CROSSMATCH)

## 2015-01-09 LAB — APTT: aPTT: 28 seconds (ref 24–37)

## 2015-01-09 LAB — LACTIC ACID, PLASMA: Lactic Acid, Venous: 1.3 mmol/L (ref 0.5–2.0)

## 2015-01-09 MED ORDER — PANTOPRAZOLE SODIUM 40 MG IV SOLR
40.0000 mg | Freq: Two times a day (BID) | INTRAVENOUS | Status: DC
  Administered 2015-01-09 – 2015-01-10 (×5): 40 mg via INTRAVENOUS
  Filled 2015-01-09 (×7): qty 40

## 2015-01-09 MED ORDER — SODIUM CHLORIDE 0.9 % IV SOLN: Freq: Once | INTRAVENOUS | Status: DC

## 2015-01-09 MED ORDER — CETYLPYRIDINIUM CHLORIDE 0.05 % MT LIQD
7.0000 mL | Freq: Two times a day (BID) | OROMUCOSAL | Status: DC
  Administered 2015-01-09 – 2015-01-11 (×6): 7 mL via OROMUCOSAL

## 2015-01-09 MED ORDER — TECHNETIUM TC 99M-LABELED RED BLOOD CELLS IV KIT
25.0000 | PACK | Freq: Once | INTRAVENOUS | Status: AC | PRN
  Administered 2015-01-09: 25 via INTRAVENOUS

## 2015-01-09 MED ORDER — CHLORHEXIDINE GLUCONATE 0.12 % MT SOLN
15.0000 mL | Freq: Two times a day (BID) | OROMUCOSAL | Status: DC
  Administered 2015-01-09 – 2015-01-12 (×5): 15 mL via OROMUCOSAL
  Filled 2015-01-09 (×7): qty 15

## 2015-01-09 MED ORDER — SODIUM CHLORIDE 0.9 % IV SOLN
1.5000 g | Freq: Four times a day (QID) | INTRAVENOUS | Status: DC
  Administered 2015-01-09 – 2015-01-12 (×14): 1.5 g via INTRAVENOUS
  Filled 2015-01-09 (×20): qty 1.5

## 2015-01-09 MED ORDER — FUROSEMIDE 10 MG/ML IJ SOLN
40.0000 mg | Freq: Four times a day (QID) | INTRAMUSCULAR | Status: AC
  Administered 2015-01-09 (×2): 40 mg via INTRAVENOUS
  Filled 2015-01-09 (×2): qty 4

## 2015-01-09 NOTE — H&P (Signed)
PULMONARY / CRITICAL CARE MEDICINE   Name: Susan Shepherd MRN: [id number] DOB: 1951-09-10    ADMISSION DATE:  01/08/2015 CONSULTATION DATE:  01/09/15  REFERRING MD :  Eduard Clos, MD  CHIEF COMPLAINT:  GI Bleed  INITIAL PRESENTATION: Susan Shepherd is a 64 y.o. African-American female with a history of Sjogren's disease and NMO on chronic steroids who presents on 2/26 with GI bleed. CCM consulted due to active bleeding.   STUDIES:  NM GI scan 2/26 >>  SIGNIFICANT EVENTS: 12/'15 >> GI bleed treated at [facility]; Dx diverticulosis  2/26 >> admit CCM; GI bleed - transfused 2u   HISTORY OF PRESENT ILLNESS:  Susan Shepherd is a 64 y.o. 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  She reports rectal bleeding beginning yesterday not associated with abdominal pain, nausea or vomiting.  She reports history of similar event several this past December.  She was hospitalized at [facility] and diagnosed with diverticulosis at that time. Since then she has continued to have small amount of rectal bleeding with BMs associated with external hemorrhoids and digital stimulation necessary for bowel movements Denies any current chest pain or shortness of breath or history of MI or HF.  She continues to be on steroids for her rheumatological diseases- currently 40 mg prednisone daily.  She reports lower extremity swelling for the past week, which she associated with her immobility and recent decrease in PT.  Denies any melena over the past week.   PAST MEDICAL HISTORY :   has a past medical history of Neuromuscular disorder; Neuromyelitis optica; Diabetes mellitus without complication; Hypertension; Paraplegia; and Sjogren's syndrome.  has past surgical history that includes Eye surgery. Prior to Admission medications   Not on File   Not on File  FAMILY  HISTORY:  is adopted. SOCIAL HISTORY:  reports that she has never smoked. She does not have any smokeless tobacco history on file. She reports that she does not drink alcohol.  REVIEW OF SYSTEMS:  Unable to perform due to being taken down for tagged RBC scan  SUBJECTIVE:   VITAL SIGNS: Temp:  [99 F (37.2 C)-99.2 F (37.3 C)] 99 F (37.2 C) (02/26 0000) Pulse Rate:  [123-134] 131 (02/26 0100) Resp:  [19-26] 24 (02/26 0100) BP: (103-137)/(78-115) 103/78 mmHg (02/26 0100) SpO2:  [98 %-100 %] 99 % (02/26 0100) Weight:  [201 lb 4.5 oz (91.3 kg)] 201 lb 4.5 oz (91.3 kg) (02/25 2200) HEMODYNAMICS:   VENTILATOR SETTINGS:   INTAKE / OUTPUT:  Intake/Output Summary (Last 24 hours) at 01/09/15 0228 Last data filed at 01/08/15 2305  Gross per 24 hour  Intake   1000 ml  Output      0 ml  Net   1000 ml    PHYSICAL EXAMINATION: General:  Obese AA F lying in bed NAD Neuro:  A&Ox3; EOMI; b/l ptosis; b/l lower extremity paraplegia HEENT:  MMM Cardiovascular:  Tachycardia w/ regular rhythm; radial pulses 2+ bilaterally; 2+ LE swelling b/l Lungs:  CTAB Abdomen:  SNTND Skin:  No rash  LABS:  CBC  Recent Labs Lab 01/09/15 0014  WBC 10.9*  HGB 7.1*  HCT 22.3*  PLT 151   Coag's No results for input(s): APTT, INR in the last 168 hours. BMET  Recent Labs Lab 01/09/15 0014  NA 142  K 4.0  CL 114*  CO2 23  BUN 14  CREATININE 0.55  GLUCOSE 160*  Electrolytes  Recent Labs Lab 01/09/15 0014  CALCIUM 6.9*   Sepsis Markers  Recent Labs Lab 01/09/15 0014  LATICACIDVEN 1.3   ABG No results for input(s): PHART, PCO2ART, PO2ART in the last 168 hours. Liver Enzymes  Recent Labs Lab 01/09/15 0014  AST 13  ALT 14  ALKPHOS 37*  BILITOT 0.7  ALBUMIN 2.2*   Cardiac Enzymes No results for input(s): TROPONINI, PROBNP in the last 168 hours. Glucose  Recent Labs Lab 01/08/15 2356  GLUCAP 140*    Imaging No results found.   ASSESSMENT /  PLAN:  PULMONARY OETT A:  No acute problem P:   Continue to monitor respiratory status  CARDIOVASCULAR CVL A:  Tachycardia BP soft; Lactic acid wnl P:  Cardiac monitoring - tachycardia should improve with RBC transfusion  RENAL A:  No acute problem P:   bmet in am  GASTROINTESTINAL A:   Hx of diverticulosis Acute GI bleed P:   - Tagged RBC scan - cbc at 5am - PPI IV - NPO; NS @ 75  HEMATOLOGIC A:   anemia P:  - Type and screen; Transfuse 2 units RBCs  INFECTIOUS A:   Tooth abscess (POA) P:    Abx: Unasyn, start date 2/26 >>   ENDOCRINE A:   DM Sjogren's disease and NMO P:   - Monitor CBGs; SSI - solu-cortef 50mg  q6hrs  NEUROLOGIC A:   Bilateral lower extremity paraplegia - Denies current pain P:   - Continue to monitor for pressure sores  FAMILY  - Updates: No family at bedside  - Inter-disciplinary family meet or Palliative Care meeting due by:  3/3  Jamal CollinJames R Jaceyon Strole, MD PGY-2, [facility]  Pulmonary and Critical Care Medicine [facility] Pager: [phone]  01/09/2015, 2:28 AM

## 2015-01-09 NOTE — Progress Notes (Signed)
0110 Report called to the receiving RN on 3036m. Pt transferred with all belonging to 2536m10. Receiving RN updated at bedside.

## 2015-01-09 NOTE — Consult Note (Addendum)
Reason for Consult:GI Bleed Referring Physician: Dr Karkkarkandy  Susan Shepherd is an 63 y.o. female.  HPI: Susan Shepherd is a 64 y.o. 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 She reports recurrent rectal bleeding beginning yesterday not associated with abdominal pain, nausea or vomiting. She was taken to [facility]. In the emergency room her hemoglobin was 10 and hematocrit was 30.9. She was given 1 unit of blood. Apparently there were no beds for admission at [place] and therefore she was transferred to the hospitalist service here at [facility]. Shortly after arrival she had ongoing melanotic stools and transferred to the intensive care unit. She received 2 units of blood overnight. Her admission hemoglobin was 7.1 with hematocrit 22.3. She had a normal lactate.  She had a prior hospitalization at [place] in January for reportedly a diverticular bleed. She states that she had a colonoscopy and ultimately an mesenteric angiogram. She states that the bleeding stopped and she was discharged to home. After being at home she developed weakness and hypotension and was admitted to [facility] from feb fifth through the ninth. I read their discharge summary. She did not receive any blood products. Her hemoglobin was around 9. She did get MRI of her brain and spine to see if there is any active flare of her chronic neurological condition which there was not. She had a urinary tract infection was treated for that.  By report she had an echocardiogram at [place] which reportedly showed an EF of 30% however I do not have this record. She is also currently suffering from a right-sided tooth abscess in his home antibiotics for that.  She is on chronic steroids because of due to her underlying neurological condition.  Past Medical History   Diagnosis Date  . Neuromuscular disorder   . Neuromyelitis optica   . Diabetes mellitus without complication   . Hypertension   . Paraplegia   . Sjogren's syndrome     Past Surgical History  Procedure Laterality Date  . Eye surgery      Family History  Problem Relation Age of Onset  . Adopted: Yes    Social History:  reports that she has never smoked. She does not have any smokeless tobacco history on file. She reports that she does not drink alcohol. Her drug history is not on file.  Allergies: Not on File  Medications: I have reviewed the patient's current medications.  Results for orders placed or performed during the hospital encounter of 01/08/15 (from the past 48 hour(s))  MRSA PCR Screening     Status: None   Collection Time: 01/08/15 10:00 PM  Result Value Ref Range   MRSA by PCR NEGATIVE NEGATIVE    Comment:        The GeneXpert MRSA Assay (FDA approved for NASAL specimens only), is one component of a comprehensive MRSA colonization surveillance program. It is not intended to diagnose MRSA infection nor to guide or monitor treatment for MRSA infections.   Glucose, capillary     Status: Abnormal   Collection Time: 01/08/15 11:56 PM  Result Value Ref Range   Glucose-Capillary 140 (H) 70 - 99 mg/dL  CBC     Status: Abnormal   Collection Time: 01/09/15 12:14 AM  Result Value Ref Range   WBC 10.9 (H) 4.0 - 10.5 K/uL   RBC 2.36 (L) 3.87 - 5.11 MIL/uL   Hemoglobin   7.1 (L) 12.0 - 15.0 g/dL   HCT 22.3 (L) 36.0 - 46.0 %   MCV 94.5 78.0 - 100.0 fL   MCH 30.1 26.0 - 34.0 pg   MCHC 31.8 30.0 - 36.0 g/dL   RDW 17.2 (H) 11.5 - 15.5 %   Platelets 151 150 - 400 K/uL  Comprehensive metabolic panel     Status: Abnormal   Collection Time: 01/09/15 12:14 AM  Result Value Ref Range   Sodium 142 135 - 145 mmol/L   Potassium 4.0 3.5 - 5.1 mmol/L   Chloride 114 (H) 96 - 112 mmol/L   CO2 23 19 - 32 mmol/L   Glucose, Bld 160 (H) 70 - 99 mg/dL   BUN 14 6 - 23 mg/dL    Creatinine, Ser 0.55 0.50 - 1.10 mg/dL   Calcium 6.9 (L) 8.4 - 10.5 mg/dL   Total Protein 3.8 (L) 6.0 - 8.3 g/dL   Albumin 2.2 (L) 3.5 - 5.2 g/dL   AST 13 0 - 37 U/L   ALT 14 0 - 35 U/L   Alkaline Phosphatase 37 (L) 39 - 117 U/L   Total Bilirubin 0.7 0.3 - 1.2 mg/dL   GFR calc non Af Amer >90 >90 mL/min   GFR calc Af Amer >90 >90 mL/min    Comment: (NOTE) The eGFR has been calculated using the CKD EPI equation. This calculation has not been validated in all clinical situations. eGFR's persistently <90 mL/min signify possible Chronic Kidney Disease.    Anion gap 5 5 - 15  Lactic acid, plasma     Status: None   Collection Time: 01/09/15 12:14 AM  Result Value Ref Range   Lactic Acid, Venous 1.3 0.5 - 2.0 mmol/L  Type and screen     Status: None (Preliminary result)   Collection Time: 01/09/15 12:14 AM  Result Value Ref Range   ABO/RH(D) O POS    Antibody Screen NEG    Sample Expiration 01/12/2015    Unit Number [id number]    Blood Component Type RED CELLS,LR    Unit division 00    Status of Unit ISSUED    Transfusion Status OK TO TRANSFUSE    Crossmatch Result Compatible    Unit Number [id number]    Blood Component Type RBC LR PHER1    Unit division 00    Status of Unit ALLOCATED    Transfusion Status OK TO TRANSFUSE    Crossmatch Result Compatible   Prepare RBC     Status: None   Collection Time: 01/09/15 12:14 AM  Result Value Ref Range   Order Confirmation ORDER PROCESSED BY BLOOD BANK   ABO/Rh     Status: None   Collection Time: 01/09/15 12:14 AM  Result Value Ref Range   ABO/RH(D) O POS   Glucose, capillary     Status: Abnormal   Collection Time: 01/09/15  5:46 AM  Result Value Ref Range   Glucose-Capillary 115 (H) 70 - 99 mg/dL    Nm Gi Blood Loss  01/09/2015   CLINICAL DATA:  Active GI bleeding.  EXAM: NUCLEAR MEDICINE GASTROINTESTINAL BLEEDING SCAN  TECHNIQUE: Sequential abdominal images were obtained following intravenous administration of Tc-14m labeled red blood cells.  RADIOPHARMACEUTICALS:  25 mCi Tc-929mn-vitro labeled red cells.  COMPARISON:  GI bleeding scan 12/12/2014, 10/22/2014, 10/17/2014  FINDINGS: Tracer activity is seen in the low pelvis, appearing at approximately the 30 minutes frame and mildly increasing in intensity. This is similar in appearance to that of prior  exam.  IMPRESSION: Findings consistent with lower sigmoid/ rectal bleed, in a pattern similar to that of prior GI bleeding scan.   Electronically Signed   By: Jeb Levering M.D.   On: 01/09/2015 04:40    Review of Systems  Constitutional: Negative for weight loss.  HENT: Negative for nosebleeds.        Rt eye blindness; has rt sided tooth abscess on abx  Eyes: Negative for blurred vision.  Respiratory: Negative for shortness of breath.   Cardiovascular: Negative for chest pain, palpitations, orthopnea and PND.       Denies DOE  Gastrointestinal: Positive for blood in stool and melena. Negative for nausea, vomiting, abdominal pain and diarrhea.       Has neurogenic bowel; uses enema and digital stimulation  Genitourinary: Negative for dysuria and hematuria.       Has neurogenic bladder  Musculoskeletal: Negative.   Skin: Negative for itching and rash.  Neurological: Negative for dizziness, focal weakness, seizures, loss of consciousness and headaches.       Denies TIAs, amaurosis fugax; has LE paraplegia; was able to transfer self bed to chair but not recently  Endo/Heme/Allergies: Does not bruise/bleed easily.  Psychiatric/Behavioral: The patient is not nervous/anxious.    Blood pressure 115/77, pulse 123, temperature 98.6 F (37 C), temperature source Oral, resp. rate 16, height 5' 3" (1.6 m), weight 201 lb 4.5 oz (91.3 kg), SpO2 96 %. Physical Exam  Vitals reviewed. Constitutional: She is oriented to person, place, and time. She appears well-developed and well-nourished. No distress.  Morbidly obese  HENT:  Head: Normocephalic and atraumatic.   Right Ear: External ear normal.  Left Ear: External ear normal.  Eyes: Conjunctivae are normal. No scleral icterus.  Neck: Normal range of motion. Neck supple. No tracheal deviation present. No thyromegaly present.  Cardiovascular: Normal rate and normal heart sounds.   Respiratory: Effort normal and breath sounds normal. No stridor. No respiratory distress. She has no wheezes.  GI: Soft. She exhibits no distension. There is no tenderness. There is no rebound and no guarding.  No prolapsed hemorrhoids; +melena on DRE  Musculoskeletal: She exhibits no edema or tenderness.  Lymphadenopathy:    She has no cervical adenopathy.  Neurological: She is alert and oriented to person, place, and time. She exhibits normal muscle tone.  Skin: Skin is warm and dry. No rash noted. She is not diaphoretic. No erythema. No pallor.  duoderm on sacram & right ischial tuberosity  Psychiatric: She has a normal mood and affect. Her behavior is normal. Judgment and thought content normal.    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 Acute anemia Chronic steroid use  We need to obtain her records from [place] from her most recent prolonged hospitalization there including her colonoscopy report, as well as the report from her angiogram. I spoke with one of our radiologists and there was no dictated report on the angiogram so it is unclear whether or not the radiologist perform the angiogram or whether a vascular surgeon performed an angiogram. I will ask the secretary to obtain her discharge summary and procedure report and then we will review them and, with treatment recommendation.   In the interim nothing by mouth Maintain type and cross Maintain IV access Check coags at some point GI needs to see this pt. Need to identify location of bleed  Leighton Ruff. Redmond Pulling, MD,  FACS  General, Bariatric, & Minimally Invasive Surgery [facility]   [facility] M 01/09/2015, 6:37 AM

## 2015-01-09 NOTE — Progress Notes (Signed)
PULMONARY / CRITICAL CARE MEDICINE   Name: Susan HeadyRosalie Swetz MRN: [id number] DOB: 01-22-1951    ADMISSION DATE:  01/08/2015 CONSULTATION DATE:  01/09/15  REFERRING MD :  Eduard ClosArshad N Kakrakandy, MD  CHIEF COMPLAINT:  GI Bleed  INITIAL PRESENTATION: Susan Shepherd is a 64 y.o. 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 CCM consulted due to active lower sigmoid/ rectal bleed.   STUDIES:  NM GI scan 2/26 >> lower sigmoid/ rectal bleed, in a pattern similar to that of prior GI bleeding scan  SIGNIFICANT EVENTS: 12/15 >> [facility] bleed [facility]; Dx diverticulosis  2/26 >> admit CCM; GI bleed;   SUBJECTIVE: Denies any complaints this morning. No CP, SOB, ab pain, palpitations.   VITAL SIGNS: Temp:  [98.6 F (37 C)-99.2 F (37.3 C)] 98.6 F (37 C) (02/26 0535) Pulse Rate:  [123-139] 123 (02/26 0600) Resp:  [16-26] 16 (02/26 0600) BP: (87-137)/(60-115) 115/77 mmHg (02/26 0600) SpO2:  [96 %-100 %] 96 % (02/26 0600) Weight:  [201 lb 4.5 oz (91.3 kg)] 201 lb 4.5 oz (91.3 kg) (02/25 2200)   HEMODYNAMICS:   VENTILATOR SETTINGS:   INTAKE / OUTPUT:  Intake/Output Summary (Last 24 hours) at 01/09/15 0725 Last data filed at 01/09/15 0600  Gross per 24 hour  Intake   1775 ml  Output    150 ml  Net   1625 ml    PHYSICAL EXAMINATION: General:  Obese AA F lying in bed NAD Neuro:  A&Ox3; EOMI; b/l ptosis; b/l lower extremity paraplegia HEENT:  MMM. Right eye blindness; Right tooth abscess Cardiovascular:  Tachycardia w/ regular rhythm; radial pulses 2+ bilaterally; 2+ LE swelling b/l Lungs:  CTAB Abdomen:  SNTND Skin:  No rash  LABS:  CBC  Recent Labs Lab 01/09/15 0014  WBC 10.9*  HGB 7.1*  HCT 22.3*  PLT 151   Coag's No results for input(s): APTT, INR in the last 168 hours. BMET  Recent Labs Lab 01/09/15 0014  NA 142  K 4.0  CL 114*  CO2 23  BUN 14  CREATININE  0.55  GLUCOSE 160*   Electrolytes  Recent Labs Lab 01/09/15 0014  CALCIUM 6.9*   Sepsis Markers  Recent Labs Lab 01/09/15 0014  LATICACIDVEN 1.3   ABG No results for input(s): PHART, PCO2ART, PO2ART in the last 168 hours. Liver Enzymes  Recent Labs Lab 01/09/15 0014  AST 13  ALT 14  ALKPHOS 37*  BILITOT 0.7  ALBUMIN 2.2*   Cardiac Enzymes No results for input(s): TROPONINI, PROBNP in the last 168 hours. Glucose  Recent Labs Lab 01/08/15 2356 01/09/15 0546  GLUCAP 140* 115*    Imaging No results found.   ASSESSMENT / PLAN:  PULMONARY OETT A:  No acute problem P:   Continue to monitor respiratory status  CARDIOVASCULAR CVL none A:  Tachycardia resolved BP soft; Lactic acid wnl By report she had an [facility] which reportedly showed an EF of 30% however I do not have this record P:  Cardiac monitoring - tachycardia should improve with RBC transfusion 2D echo as ordered for ?EF.  RENAL A:  No acute problem P:   bmet with blood draw after 2nd unit pRBC Lasix 40 mg IV q8 x2 doses. Replace electrolytes as indicated.  GASTROINTESTINAL A:   Hx of diverticulosis Acute GI bleed P:   - Tagged RBC scan - CBC after 2nd unit pRBC - PPI IV -  KVO IVF - GI consult to see today - Coags pending.  HEMATOLOGIC A:   anemia P:  - Type and screen; Transfusing 2 units RBCs  INFECTIOUS A:   Tooth abscess (POA) P:    Abx: Unasyn, start date 2/26 >>   ENDOCRINE A:   DM Sjogren's disease and NMO P:   - Monitor CBGs; SSI - solu-cortef  q6hrs  NEUROLOGIC A:   Bilateral lower extremity paraplegia - Denies current pain P:   - Continue to monitor for pressure sores  FAMILY  - Updates: No family at bedside  - Inter-disciplinary family meet or Palliative Care meeting due by:  3/3  Jamal Collin, MD PGY-2, [facility]  Transfer to the SDU and to [facility], PCCM will sign off for 2/27.  2D echo pending and  will diurese actively today.  Reportedly the last EF was 30%.  Patient seen and examined, agree with above note.  I dictated the care and orders written for this patient under my direction.  Alyson Reedy, MD [phone]  01/09/2015, 7:25 AM

## 2015-01-09 NOTE — Consult Note (Signed)
Referring Provider:  Dr. Toniann Fail Primary Care Physician:  Dr. Hinda Lenis, [facility] Primary Gastroenterologist:  None (unassigned)  Reason for Consultation:  GI bleeding  HPI: Susan Shepherd is a 64 y.o. female admitted to the hospital last night with a roughly 12 hour history of recurrent painless hematochezia associated with some degree of mental status changes.  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 Despite all this, the patient lives independently at home.  With that background, the patient was hospitalized at [facility] approximately a month ago with her first ever diverticular bleed. Although records from that hospitalization are not currently available, the patient is a good historian and indicates that she had colonoscopy showing diverticulosis (extent and location is not clear), and had an embolization procedure which brought about cessation of bleeding.  After discharge, the patient had intermittent small volume hematochezia and was briefly at [facility] (were most of her neurologic care is rendered) about 2 weeks ago, during which time apparently no transfusion was needed, nor were any interventions performed relative to the GI tract.  Then, significant bleeding recurred, as noted above.  Since coming to the hospital, the patient has had 5 bloody bowel movements, according to the ICU nurse. 2 of these have occurred since her transfer from stepdown to the ICU. She had a bleeding scan which was completed around 4:00 this morning, which showed active bleeding from the rectosigmoid area.  At this time, the patient has received the first of 2 units of packed cells, and her vital signs have improved with normal blood pressure and improvement in her tachycardia.  The patient has no history of ulcer disease or  use of anticoagulants. I do not believe she is on any ulcerogenic medications, from review of her medication chart. She is on steroids for her neurologic condition.   Past Medical History  Diagnosis Date  . Neuromuscular disorder   . Neuromyelitis optica   . Diabetes mellitus without complication   . Hypertension   . Paraplegia   . Sjogren's syndrome     Past Surgical History  Procedure Laterality Date  . Eye surgery      Prior to Admission medications   Not on File    Current Facility-Administered Medications  Medication Dose Route Frequency Provider Last Rate Last Dose  . 0.9 %  sodium chloride infusion   Intravenous Continuous Jamal Collin, MD   Stopped at 01/09/15 [phone]  . 0.9 %  sodium chloride infusion   Intravenous Once Jamal Collin, MD      . acetaminophen (TYLENOL) tablet 650 mg  650 mg Oral Q6H PRN Eduard Clos, MD       Or  . acetaminophen (TYLENOL) suppository 650 mg  650 mg Rectal Q6H PRN Eduard Clos, MD      . ampicillin-sulbactam (UNASYN) 1.5 g in sodium chloride 0.9 % 50 mL IVPB  1.5 g Intravenous Q6H Colleen Can, RPH   1.5 g at 01/09/15 9604  . antiseptic oral rinse (CPC / CETYLPYRIDINIUM CHLORIDE 0.05%) solution 7 mL  7 mL Mouth Rinse q12n4p Lupita Leash, MD      . chlorhexidine (PERIDEX) 0.12 % solution 15 mL  15 mL Mouth Rinse BID Lupita Leash, MD      . hydrocortisone sodium succinate (SOLU-CORTEF) 100 MG injection 50 mg  50 mg  Intravenous Q6H Eduard Clos, MD   50 mg at 01/09/15 0636  . insulin aspart (novoLOG) injection 0-9 Units  0-9 Units Subcutaneous Q4H Eduard Clos, MD   0 Units at 01/08/15 2345  . ondansetron (ZOFRAN) tablet 4 mg  4 mg Oral Q6H PRN Eduard Clos, MD       Or  . ondansetron [facility]) injection 4 mg  4 mg Intravenous Q6H PRN Eduard Clos, MD      . pantoprazole (PROTONIX) injection 40 mg  40 mg Intravenous Q12H Jamal Collin, MD   40 mg at 01/09/15 0502    Allergies as  of 01/08/2015  . (Not on File)    Family History  Problem Relation Age of Onset  . Adopted: Yes    History   Social History  . Marital Status: Single    Spouse Name: N/A  . Number of Children: N/A  . Years of Education: N/A   Occupational History  . Not on file.   Social History Main Topics  . Smoking status: Never Smoker   . Smokeless tobacco: Not on file  . Alcohol Use: No  . Drug Use: Not on file  . Sexual Activity: Not on file   Other Topics Concern  . Not on file   Social History Narrative  . No narrative on file    Review of Systems: No pain  Physical Exam: Vital signs in last 24 hours: Temp:  [98.6 F (37 C)-99.2 F (37.3 C)] 98.6 F (37 C) (02/26 0535) Pulse Rate:  [123-139] 123 (02/26 0600) Resp:  [16-26] 16 (02/26 0600) BP: (87-137)/(60-115) 115/77 mmHg (02/26 0600) SpO2:  [96 %-100 %] 96 % (02/26 0600) Weight:  [91.3 kg (201 lb 4.5 oz)] 91.3 kg (201 lb 4.5 oz) (02/25 2200) Last BM Date: 01/09/15 A somewhat overweight, articulate, very pleasant African-American female, skin is warm and dry, sitting up in bed, no distress whatsoever. Current heart rate 104, systolic blood pressure 115. Chest clear, heart has frequent extra beats (looks like PVCs on the monitor) with underlying regular rhythm, abdomen has occasional bowel sounds, no bruits, mild to moderate scattered tympany, no guarding, mass effect, or tenderness. Rectal exam short while ago by the surgeon, which I did not repeat, showed "melenic" stool.  Intake/Output from previous day: 02/25 0701 - 02/26 0700 In: 1775 [I.V.:745; Blood:30; IV Piggyback:1000] Out: 150 [Urine:150] Intake/Output this shift:    Lab Results:  Recent Labs  01/09/15 0014  WBC 10.9*  HGB 7.1*  HCT 22.3*  PLT 151   BMET  Recent Labs  01/09/15 0014  NA 142  K 4.0  CL 114*  CO2 23  GLUCOSE 160*  BUN 14  CREATININE 0.55  CALCIUM 6.9*   LFT  Recent Labs  01/09/15 0014  PROT 3.8*  ALBUMIN 2.2*  AST  13  ALT 14  ALKPHOS 37*  BILITOT 0.7   PT/INR No results for input(s): LABPROT, INR in the last 72 hours.  Studies/Results: Nm Gi Blood Loss  01/09/2015   CLINICAL DATA:  Active GI bleeding.  EXAM: NUCLEAR MEDICINE GASTROINTESTINAL BLEEDING SCAN  TECHNIQUE: Sequential abdominal images were obtained following intravenous administration of Tc-22m labeled red blood cells.  RADIOPHARMACEUTICALS:  25 mCi Tc-22m in-vitro labeled red cells.  COMPARISON:  GI bleeding scan 12/12/2014, 10/22/2014, 10/17/2014  FINDINGS: Tracer activity is seen in the low pelvis, appearing at approximately the 30 minutes frame and mildly increasing in intensity. This is similar in appearance to that of  prior exam.  IMPRESSION: Findings consistent with lower sigmoid/ rectal bleed, in a pattern similar to that of prior GI bleeding scan.   Electronically Signed   By: Rubye OaksMelanie  Ehinger M.D.   On: 01/09/2015 04:40    Impression: Recurrent lower GI bleeding, almost certainly of diverticular origin based on previous evaluation. Doubt ischemia given the absence of pain, the severity of bleeding, in the time course since her embolization procedure which was over a month ago.  Plan: I would favor a repeat attempt at interventional radiology as the next step. If that procedure is unsuccessful, consideration could be given to a sigmoidoscopy or colonoscopy, but in my experience, the endoscopic management of diverticular bleeding is usually unsuccessful. However, such an exam might be helpful in localizing proximal versus colonic bleeding, and/or confirming the extent of diverticular change.  We will remain on standby; please let us know if you feel that colonoscopic evaluation is called for.   LOS: 1 day   Khalea Ventura V  01/09/2015, 7:57 AM

## 2015-01-09 NOTE — Progress Notes (Signed)
64yo female tx'd from OSH for further tx of acute GIB to begin IV ABX for tooth abscess; no renal issues reported from OSH.  Will begin Unasyn 1.5g IV Q6H and monitor CBC, Cx.  Vernard GamblesVeronda Adea Geisel, PharmD, BCPS  01/09/2015 12:30 AM

## 2015-01-09 NOTE — Progress Notes (Signed)
CCS/Nicolina Hirt Progress Note    Subjective: Very pleasant patient.  Currently getting her third unit of blood since all this restarted.  BP is fine.  She has no pain.  Objective: Vital signs in last 24 hours: Temp:  [98.1 F (36.7 C)-99.2 F (37.3 C)] 98.9 F (37.2 C) (02/26 0835) Pulse Rate:  [66-139] 119 (02/26 0815) Resp:  [16-26] 20 (02/26 0815) BP: (87-137)/(60-115) 128/66 mmHg (02/26 0815) SpO2:  [96 %-100 %] 97 % (02/26 0815) Weight:  [91.3 kg (201 lb 4.5 oz)] 91.3 kg (201 lb 4.5 oz) (02/25 2200) Last BM Date: 01/09/15  Intake/Output from previous day: 02/25 0701 - 02/26 0700 In: 1775 [I.V.:745; Blood:30; IV Piggyback:1000] Out: 150 [Urine:150] Intake/Output this shift: Total I/O In: 335 [Blood:335] Out: -   General: No acute distress.  Lungs: Clear  Abd: Benign  Extremities: No concerns  Neuro: Intact.  Lab Results:  Hemoglobin 7.1 at MN. BMET  Recent Labs  01/09/15 0014  NA 142  K 4.0  CL 114*  CO2 23  GLUCOSE 160*  BUN 14  CREATININE 0.55  CALCIUM 6.9*   PT/INR No results for input(s): LABPROT, INR in the last 72 hours. ABG No results for input(s): PHART, HCO3 in the last 72 hours.  Invalid input(s): PCO2, PO2  Studies/Results: Nm Gi Blood Loss  01/09/2015   CLINICAL DATA:  Active GI bleeding.  EXAM: NUCLEAR MEDICINE GASTROINTESTINAL BLEEDING SCAN  TECHNIQUE: Sequential abdominal images were obtained following intravenous administration of Tc-1062m labeled red blood cells.  RADIOPHARMACEUTICALS:  25 mCi Tc-842m in-vitro labeled red cells.  COMPARISON:  GI bleeding scan 12/12/2014, 10/22/2014, 10/17/2014  FINDINGS: Tracer activity is seen in the low pelvis, appearing at approximately the 30 minutes frame and mildly increasing in intensity. This is similar in appearance to that of prior exam.  IMPRESSION: Findings consistent with lower sigmoid/ rectal bleed, in a pattern similar to that of prior GI bleeding scan.   Electronically Signed   By: Rubye OaksMelanie   Ehinger M.D.   On: 01/09/2015 04:40    Anti-infectives: Anti-infectives    Start     Dose/Rate Route Frequency Ordered Stop   01/09/15 0030  ampicillin-sulbactam (UNASYN) 1.5 g in sodium chloride 0.9 % 50 mL IVPB     1.5 g 100 mL/hr over 30 Minutes Intravenous Every 6 hours 01/09/15 0029        Assessment/Plan: s/p  Follow closely, keep NPO  Tagged RBC scan is not effective in localizing bleed enough to direct therapy. Usually patient are not bleeding fast enough for angiography to show positive finding.  Colonoscopy could be helpful in localizing bleeding.  However, if she requires surgical intervention, total abdominal colectomy may be warranted.  LOS: 1 day   Marta LamasJames O. Gae BonWyatt, III, MD, FACS [phone]--pager [phone]--office [facility] 01/09/2015

## 2015-01-10 DIAGNOSIS — G360 Neuromyelitis optica [Devic]: Secondary | ICD-10-CM

## 2015-01-10 DIAGNOSIS — G8220 Paraplegia, unspecified: Secondary | ICD-10-CM | POA: Diagnosis present

## 2015-01-10 DIAGNOSIS — I509 Heart failure, unspecified: Secondary | ICD-10-CM

## 2015-01-10 DIAGNOSIS — M3500 Sicca syndrome, unspecified: Secondary | ICD-10-CM | POA: Diagnosis present

## 2015-01-10 LAB — CBC
HCT: 24.2 % — ABNORMAL LOW (ref 36.0–46.0)
HEMATOCRIT: 23.7 % — AB (ref 36.0–46.0)
Hemoglobin: 8 g/dL — ABNORMAL LOW (ref 12.0–15.0)
Hemoglobin: 8.1 g/dL — ABNORMAL LOW (ref 12.0–15.0)
MCH: 31.7 pg (ref 26.0–34.0)
MCH: 31.4 pg (ref 26.0–34.0)
MCHC: 33.8 g/dL (ref 30.0–36.0)
MCHC: 33.5 g/dL (ref 30.0–36.0)
MCV: 94 fL (ref 78.0–100.0)
MCV: 93.8 fL (ref 78.0–100.0)
Platelets: 123 10*3/uL — ABNORMAL LOW (ref 150–400)
Platelets: 132 10*3/uL — ABNORMAL LOW (ref 150–400)
RBC: 2.52 MIL/uL — ABNORMAL LOW (ref 3.87–5.11)
RBC: 2.58 MIL/uL — ABNORMAL LOW (ref 3.87–5.11)
RDW: 15.8 % — AB (ref 11.5–15.5)
RDW: 15.9 % — AB (ref 11.5–15.5)
WBC: 9.5 10*3/uL (ref 4.0–10.5)
WBC: 8.9 10*3/uL (ref 4.0–10.5)

## 2015-01-10 LAB — TYPE AND SCREEN
ABO/RH(D): O POS
Antibody Screen: NEGATIVE
UNIT DIVISION: 0
Unit division: 0

## 2015-01-10 LAB — BASIC METABOLIC PANEL
Anion gap: 3 — ABNORMAL LOW (ref 5–15)
BUN: 11 mg/dL (ref 6–23)
CO2: 25 mmol/L (ref 19–32)
CREATININE: 0.52 mg/dL (ref 0.50–1.10)
Calcium: 7 mg/dL — ABNORMAL LOW (ref 8.4–10.5)
Chloride: 113 mmol/L — ABNORMAL HIGH (ref 96–112)
GFR calc Af Amer: >90 mL/min (ref >90)
GFR calc non Af Amer: >90 mL/min (ref >90)
Glucose, Bld: 132 mg/dL — ABNORMAL HIGH (ref 70–99)
POTASSIUM: 3.3 mmol/L — AB (ref 3.5–5.1)
Sodium: 141 mmol/L (ref 135–145)

## 2015-01-10 LAB — GLUCOSE, CAPILLARY
GLUCOSE-CAPILLARY: 122 mg/dL — AB (ref 70–99)
GLUCOSE-CAPILLARY: 124 mg/dL — AB (ref 70–99)
Glucose-Capillary: 120 mg/dL — ABNORMAL HIGH (ref 70–99)
Glucose-Capillary: 143 mg/dL — ABNORMAL HIGH (ref 70–99)
Glucose-Capillary: 260 mg/dL — ABNORMAL HIGH (ref 70–99)
Glucose-Capillary: 84 mg/dL (ref 70–99)

## 2015-01-10 MED ORDER — POTASSIUM CHLORIDE CRYS ER 20 MEQ PO TBCR
40.0000 meq | EXTENDED_RELEASE_TABLET | Freq: Once | ORAL | Status: AC
  Administered 2015-01-10: 40 meq via ORAL
  Filled 2015-01-10: qty 2

## 2015-01-10 NOTE — Progress Notes (Signed)
CCS/Susan Shepherd Progress Note    Subjective: Patient looks great, but hemoglobin has dropped from 9.8 to 8.0.  In trigeminy.  Objective: Vital signs in last 24 hours: Temp:  [98 F (36.7 C)-99.6 F (37.6 C)] 99.6 F (37.6 C) (02/27 0743) Pulse Rate:  [38-139] 139 (02/27 0912) Resp:  [17-31] 22 (02/27 0912) BP: (79-147)/(37-109) 93/56 mmHg (02/27 0912) SpO2:  [96 %-100 %] 100 % (02/27 0912) Last BM Date: 01/09/15  Intake/Output from previous day: 02/26 0701 - 02/27 0700 In: 2011.5 [P.O.:1280; I.V.:106.5; Blood:575; IV Piggyback:50] Out: 3035 [Urine:3035] Intake/Output this shift:    General: No acute distress.  Lungs: Clear  Abd: Benign, no pain.  Extremities: No changes  Neuro: Intact  Lab Results:   BMET  Recent Labs  01/09/15 1229 01/10/15 0305  NA 143 141  K 3.4* 3.3*  CL 112 113*  CO2 26 25  GLUCOSE 119* 132*  BUN 13 11  CREATININE 0.65 0.52  CALCIUM 7.2* 7.0*   PT/INR  Recent Labs  01/09/15 1229  LABPROT 14.8  INR 1.15   ABG No results for input(s): PHART, HCO3 in the last 72 hours.  Invalid input(s): PCO2, PO2  Studies/Results: Nm Gi Blood Loss  01/09/2015   CLINICAL DATA:  Active GI bleeding.  EXAM: NUCLEAR MEDICINE GASTROINTESTINAL BLEEDING SCAN  TECHNIQUE: Sequential abdominal images were obtained following intravenous administration of Tc-4989m labeled red blood cells.  RADIOPHARMACEUTICALS:  25 mCi Tc-6689m in-vitro labeled red cells.  COMPARISON:  GI bleeding scan 12/12/2014, 10/22/2014, 10/17/2014  FINDINGS: Tracer activity is seen in the low pelvis, appearing at approximately the 30 minutes frame and mildly increasing in intensity. This is similar in appearance to that of prior exam.  IMPRESSION: Findings consistent with lower sigmoid/ rectal bleed, in a pattern similar to that of prior GI bleeding scan.   Electronically Signed   By: Rubye OaksMelanie  Ehinger M.D.   On: 01/09/2015 04:40    Anti-infectives: Anti-infectives    Start     Dose/Rate Route  Frequency Ordered Stop   01/09/15 0030  ampicillin-sulbactam (UNASYN) 1.5 g in sodium chloride 0.9 % 50 mL IVPB     1.5 g 100 mL/hr over 30 Minutes Intravenous Every 6 hours 01/09/15 0029        Assessment/Plan: s/p  Recheck hemoglobin today at 1.  May need more blood and colonoscopy.  LOS: 2 days   Marta LamasJames O. Gae BonWyatt, III, MD, FACS [phone]--pager [phone]--office [facility] 01/10/2015

## 2015-01-10 NOTE — Progress Notes (Signed)
[place] TEAM 1 - Stepdown/ICU TEAM Progress Note  Susan Shepherd [id number] DOB: 06/01/1951 DOA: 01/08/2015 PCP: No primary care provider on file.  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  She reports rectal bleeding beginning yesterday not associated with abdominal pain, nausea or vomiting. She reports history of similar event several this past December. She was [facility] and diagnosed with diverticulosis at that time. Since then she has continued to have small amount of rectal bleeding with BMs associated with external hemorrhoids and digital stimulation necessary for bowel movements Denies any current chest pain or shortness of breath or history of MI or HF. She continues to be on steroids for her rheumatological diseases- currently 40 mg prednisone daily. She reports lower extremity swelling for the past week, which she associated with her immobility and recent decrease in PT. Denies any melena over the past week.   HPI/Subjective: 2/26 A/O 4, NAD. 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 States [facility]. States this is her second GI bleed within 2 years. States previously treated with coil embolization for previous diverticular bleed.     Assessment/Plan: Acute GI bleed -Transfused 2 units RBCs -Patient seen by GI who feel that bleeding is diverticular origin and no indication for colonoscopy at this time.  -Per GI notes/surgery notes if bleeding becomes uncontrollable total abdominal colectomy may be warranted. -Monitor patient for the next 24-48 hours for additional bleeding. -Continue Protonix 40 mg ID -Reviewed patient's medication list currently not on  medication that would exacerbate bleeding  Tooth abscess (POA) -Continue Unasyn, start date 2/26 >>   DM type II controlled -Obtain hemoglobin A1c -Obtain lipid panel -Continue sensitive SSI  Sjogren's disease and NMO - solu-cortef 50mg  q6hrs  Bilateral lower extremity paraplegia  pressure sores     Code Status: FULL Family Communication: no family present at time of exam Disposition Plan: Stabilization of GI bleed    Consultants: Dr.Robert Buccini (GI) Dr.Jay Wyatt (CCS)   Procedure/Significant Events: 12/'15 >> [facility]; Dx diverticulosis  2/26 >> admit CCM; GI bleed - transfused 2u PRBC  2/26 nuclear medicine GI scan;Findings consistent with lower sigmoid/ rectal bleed, in a pattern similar to that of prior GI bleeding scan.  Culture NA  Antibiotics: Unasyn 2/26>>  DVT prophylaxis: SCD   Devices NA   LINES / TUBES:  NA    Continuous Infusions:   Objective: VITAL SIGNS: Temp: 99.6 F (37.6 C) (02/27 0743) Temp Source: Oral (02/27 0743) BP: 93/56 mmHg (02/27 0912) Pulse Rate: 139 (02/27 0912) SPO2; FIO2:   Intake/Output Summary (Last 24 hours) at 01/10/15 1115 Last data filed at 01/09/15 1800  Gross per 24 hour  Intake    880 ml  Output   2775 ml  Net  -1895 ml     Exam: General: No acute respiratory distress Lungs: Clear to auscultation bilaterally without wheezes or crackles Cardiovascular: Regular rate and rhythm without murmur gallop or rub normal S1 and S2 Abdomen: Nontender, nondistended, soft, bowel sounds positive, no rebound, no ascites, no appreciable mass Extremities: No significant cyanosis, clubbing, or edema bilateral lower extremities  Data Reviewed: Basic Metabolic Panel:  Recent Labs Lab 01/09/15 0014 01/09/15 1229 01/10/15 0305  NA 142 143 141  K 4.0 3.4* 3.3*  CL 114* 112 113*  CO2 GLUCOSE 160* 119* 132*  BUN CREATININE 0.55 0.65 0.52  CALCIUM 6.9* 7.2* 7.0*    Liver Function Tests:  Recent Labs Lab 01/09/15 0014 01/09/15 1229  AST 13 19  ALT 14 17  ALKPHOS 37* 39  BILITOT 0.7 1.0  PROT 3.8* 4.7*  ALBUMIN 2.2* 2.6*   No results for input(s): LIPASE, AMYLASE in the last 168 hours. No results for input(s): AMMONIA in the last 168 hours. CBC:  Recent Labs Lab 01/09/15 0014 01/09/15 1229 01/10/15 0305  WBC 10.9* 11.2* 9.5  NEUTROABS  --  8.3*  --   HGB 7.1* 9.8* 8.0*  HCT 22.3* 28.8* 23.7*  MCV 94.5 93.5 94.0  PLT 151 133* 123*   Cardiac Enzymes: No results for input(s): CKTOTAL, CKMB, CKMBINDEX, TROPONINI in the last 168 hours. BNP (last 3 results) No results for input(s): BNP in the last 8760 hours.  ProBNP (last 3 results) No results for input(s): PROBNP in the last 8760 hours.  CBG:  Recent Labs Lab 01/09/15 1546 01/09/15 1921 01/10/15 0003 01/10/15 0333 01/10/15 0725  GLUCAP 163* 209* 143* 122* 84    Recent Results (from the past 240 hour(s))  MRSA PCR Screening     Status: None   Collection Time: 01/08/15 10:00 PM  Result Value Ref Range Status   MRSA by PCR NEGATIVE NEGATIVE Final    Comment:        The GeneXpert MRSA Assay (FDA approved for NASAL specimens only), is one component of a comprehensive MRSA colonization surveillance program. It is not intended to diagnose MRSA infection nor to guide or monitor treatment for MRSA infections.      Studies:  Recent x-ray studies have been reviewed in detail by the Attending Physician  Scheduled Meds:  Scheduled Meds: . sodium chloride   Intravenous Once  . ampicillin-sulbactam (UNASYN) IV  1.5 g Intravenous Q6H  . antiseptic oral rinse  7 mL Mouth Rinse q12n4p  . chlorhexidine  15 mL Mouth Rinse BID  . hydrocortisone sod succinate (SOLU-CORTEF) inj  50 mg Intravenous Q6H  . insulin aspart  0-9 Units Subcutaneous Q4H  . pantoprazole (PROTONIX) IV  40 mg Intravenous Q12H    Time spent on care of this patient: 40 mins   Drema Dallas  [facility]  [facility] Office  [phone] Pager - [phone]  On-Call/Text Page:      [email or web address]      password [email or web address]  If 7PM-7AM, please contact night-coverage [email or web address] Password [email or web address] 01/10/2015, 11:15 AM   LOS: 2 days   Care during the described time interval was provided by me .  I have reviewed this patient's available data, including medical history, events of note, physical examination, radiology studies and test results as part of my evaluation  Carolyne Littles, MD [phone] Pager

## 2015-01-10 NOTE — Progress Notes (Signed)
  Echocardiogram 2D Echocardiogram has been performed.  Stacey DrainWhite, Trinika Cortese J 01/10/2015, 1:33 PM

## 2015-01-10 NOTE — Progress Notes (Signed)
Patient ID: Susan Shepherd, female   DOB: Mar 16, 1951, 64 y.o.   MRN: [id number] [facility]  Susan Shepherd 64 y.o. Mar 16, 1951   Subjective: Feels ok. Tolerating clears. Report of one smear of blood per rectum last night but otherwise no other rectal bleeding.   Objective: Vital signs in last 24 hours: Filed Vitals:   01/10/15 0912  BP: 93/56  Pulse: 139  Temp: 99.6  Resp: 22    Physical Exam: Gen: alert, no acute distress Abd: soft, nontender, nondistended, +BS  Lab Results:  Recent Labs  01/09/15 1229 01/10/15 0305  NA 143 141  K 3.4* 3.3*  CL 112 113*  CO2 26 25  GLUCOSE 119* 132*  BUN 13 11  CREATININE 0.65 0.52  CALCIUM 7.2* 7.0*    Recent Labs  01/09/15 0014 01/09/15 1229  AST 13 19  ALT 14 17  ALKPHOS 37* 39  BILITOT 0.7 1.0  PROT 3.8* 4.7*  ALBUMIN 2.2* 2.6*    Recent Labs  01/09/15 1229 01/10/15 0305  WBC 11.2* 9.5  NEUTROABS 8.3*  --   HGB 9.8* 8.0*  HCT 28.8* 23.7*  MCV 93.5 94.0  PLT 133* 123*    Recent Labs  01/09/15 1229  LABPROT 14.8  INR 1.15      Assessment/Plan: S/P GI bleed that seems to have resolved and likely diverticular in origin. No indication to do a colonoscopy at this time. Slowly advance diet. Step-down unit bed transfer ok. Follow H/Hs. Will follow.   Juanell Saffo C. 01/10/2015, 9:41 AM

## 2015-01-10 NOTE — Progress Notes (Signed)
eLink Physician-Brief Progress Note Patient Name: Susan HeadyRosalie Gallina DOB: 12-29-1950 MRN: [id number]   Date of Service  01/10/2015  HPI/Events of Note  Hypokalemia  eICU Interventions  Potassium replaced     Intervention Category Minor Interventions: Electrolytes abnormality - evaluation and management  DETERDING,ELIZABETH 01/10/2015, 5:55 AM

## 2015-01-11 DIAGNOSIS — I272 Pulmonary hypertension, unspecified: Secondary | ICD-10-CM | POA: Diagnosis present

## 2015-01-11 DIAGNOSIS — L8990 Pressure ulcer of unspecified site, unspecified stage: Secondary | ICD-10-CM

## 2015-01-11 DIAGNOSIS — L8991 Pressure ulcer of unspecified site, stage 1: Secondary | ICD-10-CM | POA: Diagnosis present

## 2015-01-11 DIAGNOSIS — K5731 Diverticulosis of large intestine without perforation or abscess with bleeding: Secondary | ICD-10-CM | POA: Diagnosis present

## 2015-01-11 DIAGNOSIS — I5032 Chronic diastolic (congestive) heart failure: Secondary | ICD-10-CM | POA: Diagnosis present

## 2015-01-11 DIAGNOSIS — I270 Primary pulmonary hypertension: Secondary | ICD-10-CM

## 2015-01-11 DIAGNOSIS — I517 Cardiomegaly: Secondary | ICD-10-CM

## 2015-01-11 LAB — COMPREHENSIVE METABOLIC PANEL
ALT: 18 U/L (ref 0–35)
AST: 18 U/L (ref 0–37)
Albumin: 2.4 g/dL — ABNORMAL LOW (ref 3.5–5.2)
Alkaline Phosphatase: 35 U/L — ABNORMAL LOW (ref 39–117)
Anion gap: 9 (ref 5–15)
BUN: 11 mg/dL (ref 6–23)
CALCIUM: 7.7 mg/dL — AB (ref 8.4–10.5)
CO2: 24 mmol/L (ref 19–32)
CREATININE: 0.69 mg/dL (ref 0.50–1.10)
Chloride: 113 mmol/L — ABNORMAL HIGH (ref 96–112)
GFR calc non Af Amer: >90 mL/min (ref >90)
Glucose, Bld: 70 mg/dL (ref 70–99)
POTASSIUM: 3.6 mmol/L (ref 3.5–5.1)
SODIUM: 146 mmol/L — AB (ref 135–145)
TOTAL PROTEIN: 4.4 g/dL — AB (ref 6.0–8.3)
Total Bilirubin: 0.5 mg/dL (ref 0.3–1.2)

## 2015-01-11 LAB — CBC WITH DIFFERENTIAL/PLATELET
BASOS ABS: 0 10*3/uL (ref 0.0–0.1)
Basophils Relative: 0 % (ref 0–1)
Eosinophils Absolute: 0 10*3/uL (ref 0.0–0.7)
Eosinophils Relative: 0 % (ref 0–5)
HCT: 24.1 % — ABNORMAL LOW (ref 36.0–46.0)
Hemoglobin: 8.1 g/dL — ABNORMAL LOW (ref 12.0–15.0)
Lymphocytes Relative: 9 % — ABNORMAL LOW (ref 12–46)
Lymphs Abs: 0.8 10*3/uL (ref 0.7–4.0)
MCH: 32 pg (ref 26.0–34.0)
MCHC: 33.6 g/dL (ref 30.0–36.0)
MCV: 95.3 fL (ref 78.0–100.0)
MONO ABS: 1 10*3/uL (ref 0.1–1.0)
MONOS PCT: 12 % (ref 3–12)
Neutro Abs: 6.8 10*3/uL (ref 1.7–7.7)
Neutrophils Relative %: 79 % — ABNORMAL HIGH (ref 43–77)
Platelets: 144 10*3/uL — ABNORMAL LOW (ref 150–400)
RBC: 2.53 MIL/uL — AB (ref 3.87–5.11)
RDW: 16.1 % — ABNORMAL HIGH (ref 11.5–15.5)
WBC: 8.6 10*3/uL (ref 4.0–10.5)

## 2015-01-11 LAB — GLUCOSE, CAPILLARY
GLUCOSE-CAPILLARY: 96 mg/dL (ref 70–99)
Glucose-Capillary: 113 mg/dL — ABNORMAL HIGH (ref 70–99)
Glucose-Capillary: 131 mg/dL — ABNORMAL HIGH (ref 70–99)
Glucose-Capillary: 141 mg/dL — ABNORMAL HIGH (ref 70–99)
Glucose-Capillary: 205 mg/dL — ABNORMAL HIGH (ref 70–99)
Glucose-Capillary: 224 mg/dL — ABNORMAL HIGH (ref 70–99)
Glucose-Capillary: 88 mg/dL (ref 70–99)

## 2015-01-11 LAB — LIPID PANEL
CHOL/HDL RATIO: 3.3 ratio
Cholesterol: 112 mg/dL (ref 0–200)
HDL: 34 mg/dL — ABNORMAL LOW (ref >39)
LDL CALC: 64 mg/dL (ref 0–99)
TRIGLYCERIDES: 72 mg/dL (ref <150)
VLDL: 14 mg/dL (ref 0–40)

## 2015-01-11 MED ORDER — LISINOPRIL 2.5 MG PO TABS
2.5000 mg | ORAL_TABLET | Freq: Every day | ORAL | Status: DC
  Filled 2015-01-11: qty 1

## 2015-01-11 MED ORDER — PANTOPRAZOLE SODIUM 40 MG PO TBEC
40.0000 mg | DELAYED_RELEASE_TABLET | Freq: Two times a day (BID) | ORAL | Status: DC
  Administered 2015-01-11 – 2015-01-12 (×3): 40 mg via ORAL
  Filled 2015-01-11 (×3): qty 1

## 2015-01-11 MED ORDER — CARVEDILOL 3.125 MG PO TABS
3.1250 mg | ORAL_TABLET | Freq: Two times a day (BID) | ORAL | Status: DC
  Administered 2015-01-11 – 2015-01-12 (×3): 3.125 mg via ORAL
  Filled 2015-01-11 (×5): qty 1

## 2015-01-11 NOTE — Progress Notes (Signed)
Patient ID: Susan Shepherd, female   DOB: [birth year], 64 y.o.   MRN: [id number] [facility]  Susan Shepherd 64 y.o. [birth year]   Subjective: Feels good. Tolerating clears. Hungry. Nonbloody stool today.  Objective: Vital signs in last 24 hours: Filed Vitals:   01/11/15 1100  BP: 146/74  Pulse: 107  Temp: 98.8  Resp: 20    Physical Exam: Gen: alert, no acute distress Abd: soft, nontender, nondistended  Lab Results:  Recent Labs  01/10/15 0305 01/11/15 0302  NA 141 146*  K 3.3* 3.6  CL 113* 113*  CO2 25 24  GLUCOSE 132* 70  BUN 11 11  CREATININE 0.52 0.69  CALCIUM 7.0* 7.7*    Recent Labs  01/09/15 1229 01/11/15 0302  AST 19 18  ALT 17 18  ALKPHOS 39 35*  BILITOT 1.0 0.5  PROT 4.7* 4.4*  ALBUMIN 2.6* 2.4*    Recent Labs  01/09/15 1229  01/10/15 1309 01/11/15 0302  WBC 11.2*  < > 8.9 8.6  NEUTROABS 8.3*  --   --  6.8  HGB 9.8*  < > 8.1* 8.1*  HCT 28.8*  < > 24.2* 24.1*  MCV 93.5  < > 93.8 95.3  PLT 133*  < > 132* 144*  < > = values in this interval not displayed.  Recent Labs  01/09/15 1229  LABPROT 14.8  INR 1.15      Assessment/Plan: Resolved lower GI bleed likely diverticular in origin. [place] this [place] and a repeat colonoscopy is not needed at this time. Advance diet. Will sign off. Call if questions.   Susan Shepherd C. 01/11/2015, 11:30 AM

## 2015-01-11 NOTE — Progress Notes (Signed)
[place] TEAM 1 - Stepdown/ICU TEAM Progress Note  Susan Shepherd ZOX:[id number] DOB: 01/20/1951 DOA: 01/08/2015 PCP: No primary care provider on file.  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  She reports rectal bleeding beginning yesterday not associated with abdominal pain, nausea or vomiting. She reports history of similar event several this past December. She was hospitalized at [facility] and diagnosed with diverticulosis at that time. Since then she has continued to have small amount of rectal bleeding with BMs associated with external hemorrhoids and digital stimulation necessary for bowel movements Denies any current chest pain or shortness of breath or history of MI or HF. She continues to be on steroids for her rheumatological diseases- currently 40 mg prednisone daily. She reports lower extremity swelling for the past week, which she associated with her immobility and recent decrease in PT. Denies any melena over the past week.   HPI/Subjective: 2/26 A/O 4, NAD. 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 States she gets her immune workup/treatment at [facility] normally. States this is her second GI bleed within 2 years. States previously treated with coil embolization for previous diverticular bleed.     Assessment/Plan: Acute GI bleed/diverticulosis of colon with hemorrhage -Transfused 2 units RBCs -Patient seen by GI who feel that bleeding is diverticular origin and no indication for colonoscopy at this time.  -Per GI notes/surgery notes if bleeding becomes uncontrollable total abdominal colectomy may be warranted. -Stable hemoglobin -Continue Protonix 40 mg ID -Reviewed patient's medication list currently not on medication that  would exacerbate bleeding  Tooth abscess (POA) -Continue Unasyn, start date 2/26 >>   DM type II controlled -Hemoglobin A1c; pending -Lipid panel w/i ADA guidelines -Continue sensitive SSI  Sjogren's disease and NMO - solu-cortef  q6hrs  Bilateral lower extremity paraplegia -Stable  Decubitus ulcer/pressure sores -Right posterior thigh scabbed over healing nicely; continue daily wound care -Stage I sacral decubitus covered and clean with Allevyn sacral pad. -Requests Triadyne bed; ensure on rotation setting at all times  HTN -Restart Coreg 3.125mg  BID (home dose) -Per patient ACEI/ARB stop by her PCP and immunologist at [facility] for unknown reason. Therefore we will not start here.  -Would start vasodilator (Imdur) if patient's BP will tolerate after restarting her Coreg. Would benefit pulmonary hypertension.  Chronic diastolic CHF/moderate LVH/pulmonary hypertension -See HTN    Code Status: FULL Family Communication: no family present at time of exam Disposition Plan: Stabilization of GI bleed    Consultants: Dr.Robert Buccini (GI) Dr.Jay Wyatt (CCS)   Procedure/Significant Events: 12/'15 >> GI bleed treated at [facility]; Dx diverticulosis  2/26 >> admit CCM; GI bleed - transfused 2u PRBC  2/26 nuclear medicine GI scan;Findings consistent with lower sigmoid/ rectal bleed, in a pattern similar to that of prior GI bleeding scan. 2/27 echocardiogram;- Left ventricle:  Moderate concentric hypertrophy. -LVEF= 65% to 70%. -(grade 1 diastolic dysfunction). - Aorta: Aortic root dimension: 44 mm (ED). - Ascending aorta: ascending aorta was mildly dilated. - Pulmonary arteries: PA peak pressure: 49 mm Hg (S).    Culture NA  Antibiotics: Unasyn 2/26>>  DVT prophylaxis: SCD   Devices NA   LINES / TUBES:  NA    Continuous Infusions:   Objective: VITAL SIGNS: Temp: 98.8 F (37.1 C) (02/28 0727) Temp Source: Oral (02/28 0727) BP:  146/74 mmHg (02/28  1100) Pulse Rate: 107 (02/28 1100) SPO2; FIO2:   Intake/Output Summary (Last 24 hours) at 01/11/15 1135 Last data filed at 01/11/15 1100  Gross per 24 hour  Intake    490 ml  Output    720 ml  Net   -230 ml     Exam: General: A/O 4, NAD, No acute respiratory distress Lungs: Clear to auscultation bilaterally without wheezes or crackles Cardiovascular: Regular rate and rhythm without murmur gallop or rub normal S1 and S2 Abdomen: Nontender, nondistended, soft, bowel sounds positive, no rebound, no ascites, no appreciable mass Extremities: No significant cyanosis, clubbing. LE Paraplegia, negative lacerations, or decubitus ulcers on heels or calves. Small decubitus ulcer on posterior right thigh scabbed over healing nicely. Stage I sacral decubitus negative sign of infection.  Data Reviewed: Basic Metabolic Panel:  Recent Labs Lab 01/09/15 0014 01/09/15 1229 01/10/15 0305 01/11/15 0302  NA 142 143 141 146*  K 4.0 3.4* 3.3* 3.6  CL 114* 112 113* 113*  CO2 GLUCOSE 160* 119* 132* 70  BUN CREATININE 0.55 0.65 0.52 0.69  CALCIUM 6.9* 7.2* 7.0* 7.7*   Liver Function Tests:  Recent Labs Lab 01/09/15 0014 01/09/15 1229 01/11/15 0302  AST ALT ALKPHOS 37* 39 35*  BILITOT 0.7 1.0 0.5  PROT 3.8* 4.7* 4.4*  ALBUMIN 2.2* 2.6* 2.4*   No results for input(s): LIPASE, AMYLASE in the last 168 hours. No results for input(s): AMMONIA in the last 168 hours. CBC:  Recent Labs Lab 01/09/15 0014 01/09/15 1229 01/10/15 0305 01/10/15 1309 01/11/15 0302  WBC 10.9* 11.2* 9.5 8.9 8.6  NEUTROABS  --  8.3*  --   --  6.8  HGB 7.1* 9.8* 8.0* 8.1* 8.1*  HCT 22.3* 28.8* 23.7* 24.2* 24.1*  MCV 94.5 93.5 94.0 93.8 95.3  PLT 151 133* 123* 132* 144*   Cardiac Enzymes: No results for input(s): CKTOTAL, CKMB, CKMBINDEX, TROPONINI in the last 168 hours. BNP (last 3 results) No results for input(s): BNP in the last 8760 hours.  ProBNP  (last 3 results) No results for input(s): PROBNP in the last 8760 hours.  CBG:  Recent Labs Lab 01/10/15 1532 01/10/15 1937 01/11/15 0001 01/11/15 0350 01/11/15 0714  GLUCAP 124* 260* 141* 88 96    Recent Results (from the past 240 hour(s))  MRSA PCR Screening     Status: None   Collection Time: 01/08/15 10:00 PM  Result Value Ref Range Status   MRSA by PCR NEGATIVE NEGATIVE Final    Comment:        The GeneXpert MRSA Assay (FDA approved for NASAL specimens only), is one component of a comprehensive MRSA colonization surveillance program. It is not intended to diagnose MRSA infection nor to guide or monitor treatment for MRSA infections.      Studies:  Recent x-ray studies have been reviewed in detail by the Attending Physician  Scheduled Meds:  Scheduled Meds: . sodium chloride   Intravenous Once  . ampicillin-sulbactam (UNASYN) IV  1.5 g Intravenous Q6H  . antiseptic oral rinse  7 mL Mouth Rinse q12n4p  . carvedilol  3.125 mg Oral BID WC  . chlorhexidine  15 mL Mouth Rinse BID  . hydrocortisone sod succinate (SOLU-CORTEF) inj  50 mg Intravenous Q6H  . insulin aspart  0-9 Units Subcutaneous Q4H  . pantoprazole  40 mg Oral BID    Time spent on  care of this patient: 40 mins   Drema DallasWOODS, Janiel Derhammer, J , [facility]  [facility] Office  [phone] Pager - [phone]  On-Call/Text Page:      [email or web address]      password [email or web address]  If 7PM-7AM, please contact night-coverage [email or web address] Password [email or web address] 01/11/2015, 11:35 AM   LOS: 3 days   Care during the described time interval was provided by me .  I have reviewed this patient's available data, including medical history, events of note, physical examination, radiology studies and test results as part of my evaluation  Carolyne Littlesurtis Sheresa Cullop, MD [phone] Pager

## 2015-01-11 NOTE — Progress Notes (Signed)
  Subjective: Brown BM overnight, eating fulls  Objective: Vital signs in last 24 hours: Temp:  [98.4 F (36.9 C)-99.9 F (37.7 C)] 98.8 F (37.1 C) (02/28 0727) Pulse Rate:  [36-139] 36 (02/28 0800) Resp:  [17-33] 22 (02/28 0800) BP: (79-170)/(37-116) 157/88 mmHg (02/28 0800) SpO2:  [94 %-100 %] 99 % (02/28 0800) Last BM Date: 01/09/15  Intake/Output from previous day: 02/27 0701 - 02/28 0700 In: 490 [IV Piggyback:50] Out: 595 [Urine:595] Intake/Output this shift:    General appearance: alert and cooperative Resp: clear to auscultation bilaterally Cardio: freq ectopy GI: soft, NT  Lab Results:   Recent Labs  01/10/15 1309 01/11/15 0302  WBC 8.9 8.6  HGB 8.1* 8.1*  HCT 24.2* 24.1*  PLT 132* 144*   BMET  Recent Labs  01/10/15 0305 01/11/15 0302  NA 141 146*  K 3.3* 3.6  CL 113* 113*  CO2 25 24  GLUCOSE 132* 70  BUN 11 11  CREATININE 0.52 0.69  CALCIUM 7.0* 7.7*   PT/INR  Recent Labs  01/09/15 1229  LABPROT 14.8  INR 1.15   ABG No results for input(s): PHART, HCO3 in the last 72 hours.  Invalid input(s): PCO2, PO2  Studies/Results: No results found.  Anti-infectives: Anti-infectives    Start     Dose/Rate Route Frequency Ordered Stop   01/09/15 0030  ampicillin-sulbactam (UNASYN) 1.5 g in sodium chloride 0.9 % 50 mL IVPB     1.5 g 100 mL/hr over 30 Minutes Intravenous Every 6 hours 01/09/15 0029        Assessment/Plan: LGIB - seems resolved, Hb stable, no need for surgery at this time. If any re-bleeding, colonoscopy may be helpful.   LOS: 3 days    Susan Shepherd E 01/11/2015

## 2015-01-11 NOTE — Progress Notes (Signed)
Arrival Method: bed  Mental Status: alert and oriented x 4 Telemetry: applied, cms notified Skin: stage 1 on sacrum and posterior R thigh Tubes: foley  IV: LAC is NSL, RFA is NSL Pain: 0/10 Family: None present Living Situation: From home  Safety Measures: Bed in lowest position, call bell in reach 6E Orientation: Pt oriented to staff and unit

## 2015-01-12 LAB — HEMOGLOBIN A1C
Hgb A1c MFr Bld: 6 % — ABNORMAL HIGH (ref 4.8–5.6)
MEAN PLASMA GLUCOSE: 126 mg/dL

## 2015-01-12 LAB — GLUCOSE, CAPILLARY
Glucose-Capillary: 206 mg/dL — ABNORMAL HIGH (ref 70–99)
Glucose-Capillary: 140 mg/dL — ABNORMAL HIGH (ref 70–99)

## 2015-01-12 MED ORDER — AMOXICILLIN-POT CLAVULANATE 875-125 MG PO TABS
1.0000 | ORAL_TABLET | Freq: Two times a day (BID) | ORAL | Status: DC
  Filled 2015-01-12 (×2): qty 1

## 2015-01-12 MED ORDER — PREDNISONE 20 MG PO TABS
30.0000 mg | ORAL_TABLET | Freq: Every day | ORAL | Status: DC
  Administered 2015-01-12: 30 mg via ORAL
  Filled 2015-01-12 (×2): qty 1

## 2015-01-12 MED ORDER — PREDNISONE 10 MG PO TABS: 30.0000 mg | ORAL_TABLET | Freq: Every day | ORAL | Status: DC

## 2015-01-12 NOTE — Discharge Summary (Signed)
Physician Discharge Summary  Susan HeadyRosalie Cleaver ZOX:[id number] DOB: 1951/11/06 DOA: 01/08/2015  PCP: No primary care provider on file.  Admit date: 01/08/2015 Discharge date: 01/12/2015  Time spent: 35 minutes  Recommendations for Outpatient Follow-up:  1. Follow up with [facility]   Discharge Diagnoses:  Principal Problem:   Acute GI bleeding Active Problems:   Acute blood loss anemia   Neuromyelitis optica   Essential hypertension   Diabetes mellitus type 2, controlled   Sjogren's disease   Chronic paraplegia   Diverticulosis of colon with hemorrhage   Decubitus ulcer, stage I   Pressure sore   Chronic diastolic CHF (congestive heart failure)   Pulmonary hypertension   LVH (left ventricular hypertrophy)   Discharge Condition: improved  Diet recommendation: cardiac  Filed Weights   01/08/15 2200  Weight: 91.3 kg (201 lb 4.5 oz)    History of present illness:  Susan Shepherd is a 64 y.o. female who was recently admitted [facility] weeks ago for rectal bleeding was found to have diverticular bleeding and eventually was needing embolization of the artery to stop the bleeding. Patient was doing fine after that and patient started developing bleeding again today morning. In [facility] patient was found to have a large bloody bowel movement and was placed on 1 unit of packed red blood cell transfusion. Patient's hemoglobin at the time was 10 and patient was [facility] because of lack of beds. Patient to pediatric hemoglobin after transfusion was 7. Patient has had at least 3 large bloody bowel movement over a year. Patient is tachycardic. Patient denies any nausea vomiting abdominal pain fever chills. Patient was recently diagnosed with tooth abscess of the right side and was placed on antibiotics since yesterday. I did discuss the patient's primary care physician who called in. As per the patient's  primary care physician patient has been having tachycardia cause of which was not clear and recent 2-D echo done showed EF of 30%. Patient is also on tapering dose of steroids for neuromyelitis optica  Hospital Course:  Acute GI bleed/diverticulosis of colon with hemorrhage -Transfused 2 units RBCs -Patient seen by GI who feel that bleeding is diverticular origin and no indication for colonoscopy at this time.  -Per GI notes/surgery notes if bleeding becomes uncontrollable total abdominal colectomy may be warranted. -Stable hemoglobin -Continue PPI  Tooth abscess (POA) -[facility] will follow up   DM type II controlled -resume home meds  Sjogren's disease and NMO - resume home meds  Bilateral lower extremity paraplegia -Stable  Decubitus ulcer/pressure sores -Right posterior thigh scabbed over healing nicely; continue daily wound care -Stage I sacral decubitus covered and clean with Allevyn sacral pad.  HTN -Restart Coreg 3.125mg  BID (home dose) -Per patient ACEI/ARB [facility]. Therefore we will not start here.  -Would start vasodilator (Imdur) if patient's BP will tolerate after restarting her Coreg. Would benefit pulmonary hypertension.  Chronic diastolic CHF/moderate LVH/pulmonary hypertension -See HTN   Procedures:    Consultations:  GI  surgery  Discharge Exam: Filed Vitals:   01/12/15 0605  BP: 135/99  Pulse: 81  Temp: 98.9 F (37.2 C)  Resp: 16    General: A+Ox3, NAD Cardiovascular: rrr Respiratory: clear  Discharge Instructions   Discharge Instructions    Discharge instructions    Complete by:  As directed   Follow up with [facility]     Increase activity  slowly    Complete by:  As directed           Current Discharge Medication List    CONTINUE these medications which have NOT CHANGED   Details  alendronate (FOSAMAX) 70 MG tablet Take 70 mg by mouth once a week. Saturday. Take with a  full glass of water on an empty stomach.    baclofen (LIORESAL) 10 MG tablet Take 30-40 mg by mouth 3 (three) times daily. Take  at 9am,  at 12p, and 4omg at 9pm    calcium carbonate (OS-CAL - DOSED IN MG OF ELEMENTAL CALCIUM) 1250 (500 CA) MG tablet Take 1 tablet by mouth daily with breakfast.    carbonyl iron (FEOSOL) 45 MG TABS tablet Take 45 mg by mouth daily.    carvedilol (COREG) 3.125 MG tablet Take 3.125 mg by mouth 2 (two) times daily with a meal.    cholecalciferol (VITAMIN D) 1000 UNITS tablet Take 1,000 Units by mouth daily.    EPINEPHrine 0.3 mg/0.3 mL IJ SOAJ injection Inject 0.3 mg into the muscle once.    !! gabapentin (NEURONTIN) 300 MG capsule Take 600 mg by mouth at bedtime.    !! gabapentin (NEURONTIN) 400 MG capsule Take 400 mg by mouth 2 (two) times daily. At 9:am and noon    metFORMIN (GLUCOPHAGE) 500 MG tablet Take 500 mg by mouth 2 (two) times daily with a meal.    mirabegron ER (MYRBETRIQ) 25 MG TB24 tablet Take 25 mg by mouth at bedtime.    omeprazole (PRILOSEC) 40 MG capsule Take 40 mg by mouth daily.    predniSONE (DELTASONE) 10 MG tablet Take 30 mg by mouth daily with breakfast.     solifenacin (VESICARE) 10 MG tablet Take 10 mg by mouth daily.    tiZANidine (ZANAFLEX) 2 MG tablet Take 4-6 mg by mouth 3 (three) times daily. Take  at 8, and noon, then  at 6:pm    vitamin C (ASCORBIC ACID) 500 MG tablet Take 500 mg by mouth 2 (two) times daily.     !! - Potential duplicate medications found. Please discuss with provider.    STOP taking these medications     sulfamethoxazole-trimethoprim (BACTRIM DS,SEPTRA DS) 800-160 MG per tablet        Allergies  Allergen Reactions  . Bee Venom Anaphylaxis      The results of significant diagnostics from this hospitalization (including imaging, microbiology, ancillary and laboratory) are listed below for reference.    Significant Diagnostic Studies: Nm Gi Blood Loss  01/09/2015   CLINICAL  DATA:  Active GI bleeding.  EXAM: NUCLEAR MEDICINE GASTROINTESTINAL BLEEDING SCAN  TECHNIQUE: Sequential abdominal images were obtained following intravenous administration of Tc-52m labeled red blood cells.  RADIOPHARMACEUTICALS:  25 mCi Tc-31m in-vitro labeled red cells.  COMPARISON:  GI bleeding scan 12/12/2014, 10/22/2014, 10/17/2014  FINDINGS: Tracer activity is seen in the low pelvis, appearing at approximately the 30 minutes frame and mildly increasing in intensity. This is similar in appearance to that of prior exam.  IMPRESSION: Findings consistent with lower sigmoid/ rectal bleed, in a pattern similar to that of prior GI bleeding scan.   Electronically Signed   By: Rubye Oaks M.D.   On: 01/09/2015 04:40    Microbiology: Recent Results (from the past 240 hour(s))  MRSA PCR Screening     Status: None   Collection Time: 01/08/15 10:00 PM  Result Value Ref Range Status   MRSA by PCR NEGATIVE NEGATIVE Final  Comment:        The GeneXpert MRSA Assay (FDA approved for NASAL specimens only), is one component of a comprehensive MRSA colonization surveillance program. It is not intended to diagnose MRSA infection nor to guide or monitor treatment for MRSA infections.      Labs: Basic Metabolic Panel:  Recent Labs Lab 01/09/15 0014 01/09/15 1229 01/10/15 0305 01/11/15 0302  NA 142 143 141 146*  K 4.0 3.4* 3.3* 3.6  CL 114* 112 113* 113*  CO2 GLUCOSE 160* 119* 132* 70  BUN CREATININE 0.55 0.65 0.52 0.69  CALCIUM 6.9* 7.2* 7.0* 7.7*   Liver Function Tests:  Recent Labs Lab 01/09/15 0014 01/09/15 1229 01/11/15 0302  AST ALT ALKPHOS 37* 39 35*  BILITOT 0.7 1.0 0.5  PROT 3.8* 4.7* 4.4*  ALBUMIN 2.2* 2.6* 2.4*   No results for input(s): LIPASE, AMYLASE in the last 168 hours. No results for input(s): AMMONIA in the last 168 hours. CBC:  Recent Labs Lab 01/09/15 0014 01/09/15 1229 01/10/15 0305 01/10/15 1309  01/11/15 0302  WBC 10.9* 11.2* 9.5 8.9 8.6  NEUTROABS  --  8.3*  --   --  6.8  HGB 7.1* 9.8* 8.0* 8.1* 8.1*  HCT 22.3* 28.8* 23.7* 24.2* 24.1*  MCV 94.5 93.5 94.0 93.8 95.3  PLT 151 133* 123* 132* 144*   Cardiac Enzymes: No results for input(s): CKTOTAL, CKMB, CKMBINDEX, TROPONINI in the last 168 hours. BNP: BNP (last 3 results) No results for input(s): BNP in the last 8760 hours.  ProBNP (last 3 results) No results for input(s): PROBNP in the last 8760 hours.  CBG:  Recent Labs Lab 01/11/15 1654 01/11/15 2001 01/11/15 2334 01/12/15 0406 01/12/15 0742  GLUCAP 131* 205* 224* 206* 140*       Signed:  Malikye Reppond  [facility] 01/12/2015, 9:29 AM

## 2015-01-13 ENCOUNTER — Encounter
Admit: 2015-01-13 | Disposition: A | Discharge status: Home / Self Care | Payer: Self-pay | Attending: Family Medicine | Admitting: Family Medicine

## 2015-02-13 ENCOUNTER — Encounter
Admit: 2015-02-13 | Disposition: A | Discharge status: Home / Self Care | Payer: Self-pay | Attending: Family Medicine | Admitting: Family Medicine

## 2015-03-07 NOTE — Consult Note (Signed)
Details:   - GI Note:  Flex sig with old blood poor prep. Could not identify cause of bleeding.   a/p: - hgb this evening and am - if Hgb continues to fall, will require colonoscopy - colon prep will be very diffcult given paraplegia, need for manual disimpaction at baseline.   Electronic Signatures: Dow Adolphein, Matthew (MD)  (Signed 07-Dec-15 16:58)  Authored: Details   Last Updated: 07-Dec-15 16:58 by Dow Adolphein, Matthew (MD)

## 2015-03-07 NOTE — Discharge Summary (Signed)
PATIENT NAME:  Susan Shepherd, Susan Shepherd MR#:  [id number] DATE OF BIRTH:  November 08, 1951  DATE OF ADMISSION:  10/17/2014 DATE OF DISCHARGE:  10/21/2014  ADMITTING DIAGNOSES:  1.  Lower gastrointestinal bleed, acute.  2.  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 3.  Relative hypertension.   DISCHARGE DIAGNOSES:  1.  Acute lower gastrointestinal bleed; it seemed to be diverticular.  2.  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  3.  Acute blood loss anemia from lower gastrointestinal bleed.  4.  Hypotension, resolved, improved.  CONSULTATIONS: Gastroenterology.   PROCEDURES: Sigmoidoscopy on 10/20/2014.   HISTORY OF PRESENT ILLNESS: Ms. 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 She was brought into the ED with a chief complaint of gastrointestinal bleed. Please review history and physical for details.   HOSPITAL COURSE: The patient is admitted to the hospital with acute lower gastrointestinal bleed. This lower gastrointestinal bleeding is assumed to be from trauma, as she has neurogenic bowel. She does self enemas and does digital disimpaction on a daily basis. The other differential is diverticular bleed.   The patient was admitted. She was given IV fluids. Hemoglobin and hematocrit were monitored closely. Dr. Dow AdolphMatthew Rein, gastroenterology, was consulted. The patient did not have any bowel movements during the hospital course until 10/20/2014. At that point, an enema was given as a preparation for flexible sigmoidoscopy. The patient was not having any abdominal pain. No nausea or vomiting. Denies any hematemesis. Hospital course was uneventful, but hemoglobin trended down from 11.5 on 10/17/2014 to 8.3 on 10/20/2014.  On 10/20/2014, the patient had a flexible sigmoidoscopy, which has revealed no immediate complications. Large amounts of clotted blood, old blood and stool were found in the rectum. Preparation of the colon was poor. They have recommended to monitor closely, and consider for full colonoscopy after 2 days of colon prep. Following the procedure, the patient's hemoglobin went up to 9.2 on 10/20/2014 at 1851, and it was monitored closely. On 12/08 it was at 8.9. Subsequently, it went up to 9.7 without blood transfusions. The patient is clinically stable with no abdominal pain. No other episodes of GI bleed. Gastroenterology has recommended to discharge the patient. Her progress was notified to her primary care physician, Dr. Purcell Moutoneilly, on a regular basis during the hospital course. The patient was discharged to home in stable condition on 10/21/2014.    Dr.  Shelle Ironein has recommended to follow up with him as an outpatient and, at that point, he wants to consider barium enema versus CT colonography or colonoscopy.    The patient was treated for UTI, as recommended by primary care physician. Prior to the hospital admission, the patient had urine culture done, which has revealed Escherichia coli, which is pansensitive. The patient is started on Bactrim during the hospital course for acute cystitis.   The patient was on prednisone and we thought about discontinuing that, but this was discussed with GI, Dr. Alycia Rossettiyan. He has recommended to continue prednisone because of her comorbidities. The plan is not to put her on NSAIDs as that will makes her gastrointestinal bleed worse, with a combination of prednisone. As the patient is not on any NSAIDs, we have continued prednisone 40 mg once daily during the hospital course.   The patient's vital signs, at  the time of discharge on 10/21/2014: Temperature 98 degrees Fahrenheit, pulse 65, respirations 18, blood pressure 141/90, pulse oximetry 97% on room air.   SIGNIFICANT LABORATORIES  AND IMAGING STUDIES: Hemoglobin was 11.5 on 10/17/2014 and  trended down to 8.3 on 10/20/2014; subsequently, it went up to 9.2 on 10/20/2014 evening at 18 hours 51 minutes and, on the day of discharge on 10/21/2014, it was at 8.9 without any blood transfusions.   DISCHARGE MEDICATIONS: Omeprazole 20 mg p.o. once daily, Vitamin C 500 mg p.o. b.i.d., EpiPen injectable as needed for allergic reactions, tetrahydrozoline ophthalmic eyedrops 3 times a day as needed, calcium carbonate 1250 mg 1 tablet p.o. once a day, alendronate 70 mg p.o. once weekly on Saturdays, Baclofen 10 mg 4 tablets 2 times a day, diazepam 2 mg 1 tablet p.o. once a day as needed for anxiety, Colace 100 mg 1 capsule p.o. 2 times a day as needed for constipation, Gabapentin 400 mg 1 capsule p.o. 2 times a day at 9:00 a.m. and Noon, lidocaine apply topically as needed, Zofran 4 mg p.o. every 8 hours as needed for nausea, Senna 8.6 mg 2 tablets p.o. once a day as needed for constipation, Enemmez Mini 1 rectally once a day as needed for constipation, tizanidine 2 mg 1 tablet p.o. 2 times a day in the morning and at [facility] with 4 mg tablets, 4 mg of tizanidine taken with 2 mg, which she has to take 3 times a day, in the morning, at [facility], with 2 mg. Cholecalciferol 1000 mg 1 tablet p.o. once a day, Tylenol 325 mg 2 tablets every 4 hours as needed for pain, Myrbetriq 25 mg 1 tablet p.o. once daily at bedtime, prednisone 10 mg 4 tablets p.o. once daily, Gabapentin 300 mg 2 tablets p.o. once daily at bedtime, VESIcare 10 mg 1 tablet p.o. once daily in addition to Myrbetriq, metformin 500 mg p.o. once daily, Desitin cream apply to the affected area as needed, Bactrim 1 tablet p.o. q. 12 hours for 2 more days, Feosol 45 mg 1 tablet p.o. b.i.d., baclofen 10 mg 3 tablets p.o. once a day at noon time. Losartan was discontinued in view of the hypotension.   DIET: Regular.   ACTIVITY: As tolerated.   FOLLOWUP APPOINTMENTS: Follow up with the primary care  physician, Dr. Purcell Mouton in 2 days, Dr. Alycia Rossetti in 2 weeks.   Plan of care was discussed in detail with the patient. She is aware of the plan. She was discharged to home in stable condition.   CODE STATUS: She is full code.  TOTAL TIME SPENT ON THE DISCHARGE: 45 minutes.    ____________________________ Ramonita Lab, MD ag:MT D: 10/22/2014 17:19:30 ET T: 10/22/2014 18:17:54 ET JOB#: [id number]  cc: Ramonita Lab, MD, <Dictator> Dow Adolph, MD Ramonita Lab MD ELECTRONICALLY SIGNED 10/30/2014 17:02

## 2015-03-07 NOTE — Consult Note (Signed)
PATIENT NAME:  Susan Shepherd, Susan Shepherd MR#:  [id number] DATE OF BIRTH:  23-Aug-1951  DATE OF CONSULTATION:  10/23/2014  REFERRING PHYSICIAN:   CONSULTING PHYSICIAN:  Cala Bradford A. Arvilla Market, ANP (Adult Nurse Practitioner)  REFERRING PHYSICIAN: Dr. Winona Legato.    CONSULTING PHYSICIAN: Lynnae Prude, MD/Virgia Kelner Arvilla Market, ANP.   REASON FOR CONSULTATION: GI bleed.   HISTORY OF PRESENT ILLNESS: This 64 year old African-American female patient of Dr. Lajuana Matte at [facility], returns to the hospital 1 day after discharge for recurrent bright red rectal bleeding with clots. She denies any abdominal pain or tenderness.   The patient was recently admitted 10/17/2014 through 10/21/2014 with lower gastrointestinal bleed acute, and she did undergo a flexible sigmoidoscopy performed by Dr. Shelle Iron 10/20/2014 notable for large amounts of clotted blood in the rectum and the rectosigmoid. He could not visualize past this area. A GI bleeding scan 10/22/2014 was negative.  His plan was to do outpatient colonoscopy. She did have a stable hemoglobin for a couple of days prior to discharge.   She went home, and the following morning resumed her normal bowel care which includes a mini enema and digital stimulation. The patient passed  a large amount of stool with fresh blood and clots. The patient said she gushed blood. She was readmitted yesterday. She has had no further rectal bleeding. She is passing flatus and feels like there is stool in the rectum that needs to be passed. Her hemoglobin on discharge was 11.2 and that was elevated from what she was running at baseline at 9.2.  Today,  she her Hgb has remained 9.4-9.1. Admission BUN is 14, creatinine 0.83. She remains on clear liquid diet. The patient has never had a colonoscopy. She reports stable bowel movements with her bowel regimen for over a year. What is new for this patient is that she is on a new biologic tocilizumab which has caution regarding ulcers, diverticulosis, or  colon perforation. The patient's primary provider has discussed her current lower GI bleed with the rheumatologist and reports there has been some concern over this medication causing potential GI side effect. The patient has shown no signs of perforated bowel as she has had no pain or tenderness. She has shown no signs of an ulcer without melena, nausea, vomiting, or  abdominal pain. Her [facility] physicians are holding the tocilizumab at this time.   No prior history of EGD or ulcer history. She does have sicca syndrome. She is a diabetic.   PAST MEDICAL HISTORY:   1.  History of  neuromyelitis optica 2013. The patient has received treatment with rituximab, last dose 04/25/2014, cytoxin, plasmaphoresis, steroids, tocilizumab which is currently on hold.  2.  Paraplegia secondary to neuromyelitis optica, is wheelchair bound. She is followed by neurologist, rheumatologist.  3.  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  4.  History of muscle spasms, generalized muscle weakness, and functional plegia.  5.  Neurogenic bowel and bladder secondary to neuromyelitis optica requiring a bowel program with mini enemas, digital extraction of stools, and straight catheterizing the bladder every 5 hours. 6.  Diabetes mellitus.  7.  Hospitalization.  8.  Flexible sigmoidoscopy with lower GI bleed 10/20/2014, thought secondary to diverticulosis.  9.  History of urinary tract infection.   10.  History of sicca syndrome.    HOME MEDICATIONS:  1. Tylenol 325 mg 2 tablets every 4 hours as needed.  2. Alendronate 70 mg weekly on Saturday.  3. Ascorbic acid 500 mg twice daily.  4. Baclofen 10 mg 4 tablets in the a.m., 3 tablets at noon, 4 tablets in the evening.  5. Calcium carbonate 1250 mg daily.  6. Vitamin D 1000 units daily.  7. Colace 100 mg twice daily as needed, not used in months.  8. Desitin topical cream if needed.  9. Enemeez mini 238 mg rectal enema once daily as needed.   10. EpiPen 0.3 mg once daily as needed.  11. Feosol capsule 45 mg twice daily with recent discharge, not yet started.  12. Gabapentin 300 mg 2 capsules 400 mg twice daily and 600 mg at bedtime.  13. Lidocaine topical gel 2% as needed, not using.  14. Metformin 500 mg once daily, new prescription, not yet started.  15. Myrbetriq 25 mg at bedtime for bladder spasm.  16. Omeprazole 40 mg daily.  17. Prednisone 40 mg daily, she reports this is taper.   18. Senna 8.6 mg 2 tablets at bedtime as needed, currently not requiring.  19. Tetrahydrozoline ophthalmologic solution 0.05% two drops to each affected eye 3 times daily for itchy eyes, rarely using, finds this not helpful.  20. Tizanidine 2 mg, she takes 6 mg total twice daily, 4 mg at noon.  21. Vesicare 10 mg daily.  22. Zofran ODT 4 mg available, not using.   ALLERGIES: BEE STINGS.   SOCIAL HISTORY: The patient lives home alone, has home health 7 days a week, [facility].   FAMILY HISTORY: Positive for diabetes and hypertension. Negative for colon cancer.   REVIEW OF SYSTEMS: 12 systems reviewed, and positive for her neurologic changes with numbness and weakness lower extremities, she is getting a little sensation back at times. GI-wise she reports gas, remaining 10 systems otherwise negative.   PHYSICAL EXAMINATION:  VITAL SIGNS: 97.4, 76, 20, 120/84, pulse oximetry on room air is 98%.  GENERAL: Well-appearing, well-nourished African-American female resting in bed, NAD. She is very pleasant, an excellent historian.  HEENT: Head is normocephalic. Conjunctivae pink. Sclerae anicteric. Oral mucosa is moist and intact.  NECK: Supple. Trachea is midline.  CARDIOVASCULAR: S1, S2 without murmur or gallop.  LUNGS: CTA. Respirations are nonlabored.  ABDOMEN: Soft, nontender in all quadrants. Good bowel sounds. Benign exam.  RECTAL: Deferred.  PSYCHOLOGIC: Affect and mood is within normal. Very positive attitude.   NEUROLOGIC: Speech is clear. Memory is good. No confusion, full neurological exam is deferred.    LABORATORY:  Admission blood work with glucose 98, BUN 14, creatinine 0.83, sodium 147, potassium 3.4. Albumin 3.4. Liver panel otherwise unremarkable. WBC 7.0, hemoglobin 11.2, platelets 106,000.   Repeat laboratory studies 10/23/2014: Notable for hemoglobin 9.4 to 9.1 and has remained stable.   RADIOLOGY: GI blood loss study 10/22/2014 once again showed no evidence of active bleeding.   Flexible sigmoidoscopy reviewed from 10/20/2014, large amount of clotted blood in the rectum and rectosigmoid, could not visualize pass this area.   IMPRESSION:  The patient presents with lower gastrointestinal bleed. Etiology could be diverticular, sounds like there is more blood that we would expect from internal hemorrhoids or any type of rectal trauma from her enema. The patient reports that there is absolutely no way she has had rectal trauma from her fecal extraction and enema, she has been doing this regimen for a long time and has had no pain or problems. She presents with no abdominal pain or tenderness. There is some concern by her rheumatologist and primary care provider that her  recent new biologic- tocilizumab could  causse GI side effects. I spoke with Dr. Lajuana MatteSharon Riley, her primary care provider who has been in contact with [facility] rheumatologist and they are aware of what is going on.   PLAN:  I did discuss this case with Dr. Mechele CollinElliott in collaboration of care. We will give this patient 2 quarts of Crystal Light with MiraLax as a bowel prep tonight, and repeat this bowel prep in the a.m. He will attempt a colonoscopy tomorrow afternoon. The patient will be on clear liquids until 11:00 a.m.  The patient was counseled regarding the procedure and we discussed risks, benefits, and possible complications. Further GI recommendations pending study results. Thank you for this consultation.   These services  provided by Cala BradfordKimberly A. Arvilla MarketMills, MS, APRN, BC, ANP under collaborative agreement with Lynnae Prudeobert Elliott, MD.     ____________________________ Ranae PlumberKimberly A. Arvilla MarketMills, ANP (Adult Nurse Practitioner) kam:bu D: 10/23/2014 16:40:12 ET T: 10/23/2014 17:18:33 ET JOB#: [id number]  cc: Cala BradfordKimberly A. Arvilla MarketMills, ANP (Adult Nurse Practitioner), <Dictator> Ranae PlumberKimberly A. Suzette BattiestMills RN, MSN, ANP-BC Adult Nurse Practitioner ELECTRONICALLY SIGNED 10/24/2014 9:22

## 2015-03-07 NOTE — Consult Note (Signed)
Details:   - GI Note:  Hgb stable.  No bleeding in 48 hours. Suspect this is diverticular bleed.   Colonoscopy prep would be quite difficult and doubt we would get a good clean out despite two day prep given paraplegia and need for manual disimpaction.    I would like to see Mr Susan Shepherd in clinic in a few weeks to discuss CT scan, vs barium enema vs CT colonography, colonoscopy etc.    Ok with discharge this am.   Electronic Signatures: Dow Adolphein, Kellan Boehlke (MD)  (Signed 08-Dec-15 08:19)  Authored: Details   Last Updated: 08-Dec-15 08:19 by Dow Adolphein, Lilyrose Tanney (MD)

## 2015-03-07 NOTE — Consult Note (Signed)
Details:   - GI Note:  Off the floor when I came to see her.  tagged scan appears negative per my read.    follow hgb.  Will see and staff the consult tomorrow.   Electronic Signatures: Dow Adolphein, Matthew (MD)  (Signed 04-Dec-15 18:45)  Authored: Details   Last Updated: 04-Dec-15 18:45 by Dow Adolphein, Matthew (MD)

## 2015-03-07 NOTE — H&P (Signed)
PATIENT NAME:  Susan Shepherd, Susan Shepherd MR#:  [id number] DATE OF BIRTH:  [birth year]  DATE OF ADMISSION:  10/17/2014  PRIMARY CARE PHYSICIAN: Hinda LenisSharon Reilly, MD - [facility]  CHIEF COMPLAINT: Rectal bleed.   HISTORY OF PRESENT ILLNESS: Susan Shepherd is a pleasant 64 year old African American female with past medical history of neuromyelitis optic since 2013, who is wheelchair dependent. She has a history of neurogenic bowel and neurogenic bladder and paraplegia below her waist line, [facility], after she continued to have rectal bleed since yesterday. The patient has a history of hemorrhoids. She does have a bowel regimen, which she follows daily by giving enema and digital extraction of stools. She noticed some blood in her bowels yesterday. However, she thought it could be because of the hemorrhoids. [facility] and, after lunch over there, she started noticing blood trickling down her legs. She came to the Emergency Room and she did have blood clots in her diapers and rectal vault filled up with clots. She is being admitted for rectal bleeding. She denies any bleeding in the past. She denies any rectal bleed in the past. There is no mention of colonoscopy in the old records.   When the patient came to the Emergency Room, her blood pressure was in the 60s. She was receiving wide open IV fluids during my evaluation. Systolic blood pressure was 99. The patient was asymptomatic. She reports her systolic blood pressure ranges anywhere from 100 to 180 and it seems to be "labile blood pressure". She is being admitted for further evaluation and management.   PAST MEDICAL HISTORY:  1.  External hemorrhoids.  2.  History of UTIs.  3.  Neuromyelitis optica since 2013.  4.  The patient is paraplegic, wheelchair-bound. [facility] at [facility].  5.  History of right eye blindness as part of her neurologic illness.  6.  History of muscle spasm and  generalized muscle weakness.  7.  Plegia, functional.  8.  Neurogenic bowel and bladder secondary to her neuromyelitis optica.    9.  History of sicca syndrome.   MEDICATIONS:  1.  Zofran 4 mg every 8 hours p.r.n.  2.  VESIcare 10 mg daily.  3.  Tizanidine 4 mg 1 tablet 3 times a day.  4.  Tizanidine 2 mg 1 tablet b.i.d.  5.  Tetrahydrozoline ophthalmic drops, 2 drops to affected eyes 3 times a day.  6.  Senna 8.6, 2 tablets daily at bedtime.  7.  Prednisone 40 mg daily.  8.  Omeprazole 40 mg daily.  9.  Myrbetriq 25 mg p.o. daily at bedtime.  10.  Metformin 500 mg p.o. daily.  11.  Losartan 100 mg p.o. daily.  12.  Lidocaine topical 2%, apply to affected area once a day as needed.  13.  Gabapentin 400 mg 1 capsule b.i.d.  14.  Gabapentin 300 mg 2 capsules once a day at bedtime.  15.  The patient takes enemas rectally once a day.  16.  Diazepam 2 mg once a day as needed.  17.  Desitin 13% topical cream, applied to affected area.  18.  Colace 100 mg 1 capsule b.i.d.  19.  Cholecalciferol 1000 international units p.o. daily.  20.  Calcium carbonate 1250, 1 tablet p.o. daily.  21.  Bactrim DS 800/160, 1 tablet b.i.d.  22.  Baclofen 10 mg 3 tablets once a day at noon.  23.  Baclofen 10 mg 4 tablets b.i.d.  24.  Ascorbic acid 500 mg 1 tablet 2 times a day.  25.  Alendronate 70 mg 1 tablet once a week.  26.  Acetaminophen 325 mg 2 tablets every 4 hours.   ALLERGIES: BEE STINGS.  SOCIAL HISTORY: The patient lives at home. She has home help 7 days a week. [facility].   FAMILY HISTORY: Positive for diabetes, hypertension.   REVIEW OF SYSTEMS:  CONSTITUTIONAL: Positive for weakness. No fever or fatigue.  EYES: No blurred or double vision, glaucoma, cataracts.  EARS, NOSE, AND THROAT: No tinnitus, ear pain, hearing loss or postnasal drip.  RESPIRATORY: No cough, wheeze, hemoptysis or dyspnea.  CARDIOVASCULAR: No chest pain, orthopnea, edema. Positive for  hypertension.  GASTROINTESTINAL: No nausea, vomiting, diarrhea, abdominal pain. Positive for rectal bleeding.  GENITOURINARY: No dysuria, hematuria, frequency.  ENDOCRINE: No polyuria, nocturia, or thyroid problems.  HEMATOLOGY: No anemia or easy bruising or bleeding.  SKIN: No acne, rash, lesion.  MUSCULOSKELETAL: No arthritis, cramps, swelling, or gout.  NEUROLOGIC: No CVA, TIA, dementia or headaches.  PSYCHIATRIC: No anxiety or depression. Bipolar disorder.  All other systems reviewed and negative.    ASSESSMENT: Susan Shepherd is a 64 year old with history of neuromyelitis optica, chronic paraplegia from the waist down, who is wheelchair bound, who comes into the Emergency Room with:  1.  Acute lower GI bleed/rectal bleed suspected. Hemorrhoidal bleed secondary to trauma given her bowel regimen that includes digital extraction, possibly could be some trauma, versus diverticular bleed. The patient is at present hemodynamically stable. She has related hypotension; however, since she is asymptomatic, we will continue IV fluids, monitor her H and H closely. Her hemoglobin is stable at 11.1. Dr. Shelle Iron from gastroenterology will see the patient in consultation. Transfuse as needed. Risks and benefits for transfusion have been discussed with the patient.  2.  History of paraplegia, functional, secondary to neuromyelitis optic. The patient is wheelchair bound. She has a history of a neurogenic bladder and uses self catheterization in and out every 5-6 hourly and will continue to do so. She also has history of neurogenic bowel.  3.  Relative hypotension. Will hold on her losartan. Will continue IV fluids for now.  4.  Deep vein thrombosis prophylaxis, SCDs and TEDs. Avoid any antiplatelet agents given rectal bleed.   5.  Gastroesophageal reflux disease. Continue proton pump inhibitors.   The above was discussed with the patient. She is currently getting a bleeding scan. Case was also discussed with Dr.  Shelle Iron.    TIME SPENT: 55 minutes.    ____________________________ Wylie Hail Allena Katz, MD sap:MT D: 10/17/2014 15:54:22 ET T: 10/17/2014 16:14:53 ET JOB#: [id number]  cc: Gaelen Brager A. Allena Katz, MD, <Dictator> Willow Ora MD ELECTRONICALLY SIGNED 11/07/2014 17:01

## 2015-03-07 NOTE — Consult Note (Signed)
Pt with rare neurological disease causing severe neuritis, non ambulatory due to this.  Due to rectal bleeding of uncertain cause she will have a colonoscopy tomorrow afternoon probably with Dr. Shelle Ironein.  Discussed briefly with him and the patient in separate conversations.  Electronic Signatures: Scot JunElliott, Robert T (MD)  (Signed on 10-Dec-15 19:12)  Authored  Last Updated: 10-Dec-15 19:12 by Scot JunElliott, Robert T (MD)

## 2015-03-07 NOTE — Discharge Summary (Signed)
PATIENT NAME:  Susan Shepherd, Susan Shepherd MR#:  [id number] DATE OF BIRTH:  11-Feb-1951  DATE OF ADMISSION:  10/22/2014 DATE OF DISCHARGE:  10/26/2014  PRESENTING COMPLAINT: Rectal bleed.   DISCHARGE DIAGNOSES: 1.  Recurrent lower gastrointestinal bleed, suspect diverticular.  2.  History of internal and external hemorrhoids, per colonoscopy.  3.  Functional paraplegia.  4.  Neurogenic bladder and bowel.  5.  Hypertension.  6.  Colonoscopy showed non-thrombosed external hemorrhoids and severe diverticulosis in sigmoid colon, in the descending colon and in distal transverse colon. Internal hemorrhoids. Mild diverticulosis in proximal transverse colon.   CODE STATUS: FULL code.   DISCHARGE MEDICATIONS: 1.  Omeprazole 40 mg daily.  2.  Ascorbic acid 500 mg b.i.d.  3.  EpiPen as needed.  4.  Tetrahydrozoline ophthalmic drops 0.05% two drops to effected eye 3 times a day as needed.  5.  Calcium carbonate 1250 mg 1 tablet daily.  6.  Lidocaine topical film apply to effected area as needed.  7.  Alendronate 70 mg p.o. once a week on Saturday.  8.  Baclofen 10 mg 3 tablets at noon.  9.  Diazepam 2 mg 1 tablet as needed for anxiety.  10.  Colace 100 mg 1 capsule b.i.d.  11.  Gabapentin 400 mg 1 capsule b.i.d. at 9:00 a.m. and noon.  12.  Zofran 4 mg ODT as needed.  13.  Senna 8.6 mg 2 tablets once a day at bedtime.  14.  Tizanidine 2 mg one tablet b.i.d.  15.  Tizanidine 4 mg one tablet 3 times a day.  16.  Enemeez mini rectal enema once a day in the morning as needed.  17.  Cholecalciferol 1000 international units daily.  18.  Tylenol 650 q. 4 p.r.n.  19.  Myrbetriq 25 mg extended release at bedtime.  20.  Prednisone 40 mg daily.  21.  Gabapentin 300 mg 2 capsules once a day at bedtime.  22.  VESIcare 10 mg once a day in addition to Myrbetriq.   23.  Metformin 500 mg daily.  24.  Desitin as needed.  25.  Bactrim tablet b.i.d.  26.  Feosol 45 mg 1 tablet b.i.d.  27.  Baclofen 10 mg 4 tablets  twice a day.  28.  Losartan 100 mg daily.   DISCHARGE INSTRUCTIONS: 1.  Follow up with the [facility].  2.  Follow up with [facility] motility clinic, which will be set up by [facility] in 2 to 4 weeks.  3.  Follow up with Dr. Shelle Iron, GI, if needed.   DIAGNOSTIC DATA: Hemoglobin at discharge is 9.4. Creatinine is 0.72. Sodium is 147. Potassium is 3.6.  BRIEF SUMMARY OF HOSPITAL COURSE:  Susan Shepherd is a very pleasant 64 year old African American female with history of functional paraplegia due to her neurologic disorder along with history of neurogenic bladder and bowel and history of diverticulosis with recent GI bleed who got re-admitted with:  1.  Lower rectal bleed, which suspected was due to recurrent diverticular bleed. She had colonoscopy by Dr. Shelle Iron, which showed results as above. GI recommended to follow up with [facility] motility program at [facility]. Her bleeding scan in the past was negative. She did not get any blood transfusion this admission. She was tolerating p.o. mechanical soft diet. Hemoglobin at discharge was 9.4.  2.  Hypertension. Resumed her p.o. losartan.  3.  Hyponatremia. Improved after IV fluids.  4.  Hypokalemia. Repleted.  5.  Muscle spasms with functional paraplegia. She has been placed  on her baclofen, diazepam and gabapentin.  6.  Type 2 diabetes. Continued her outpatient meds.   Overall hospital stay otherwise remained stable. The patient will follow up with above MDs as instructed. She remained a FULL code.   TIME SPENT: 40 minutes. ____________________________ Wylie HailSona A. Allena KatzPatel, MD sap:sb D: 10/27/2014 06:49:49 ET T: 10/27/2014 07:24:39 ET JOB#: [id number]  cc: Tajh Livsey A. Allena KatzPatel, MD, <Dictator> Dow AdolphMatthew Rein, MD [facility]  Willow OraSONA A Wanita Derenzo MD ELECTRONICALLY SIGNED 11/07/2014 17:01

## 2015-03-11 NOTE — Consult Note (Signed)
PATIENT NAME:  Susan Shepherd, Susan Shepherd MR#:  [id number] DATE OF BIRTH:  07/24/1951  DATE OF CONSULTATION:  10/18/2014  REFERRING PHYSICIAN:  Ramonita Lab, MD   CONSULTING PHYSICIAN:  Dow Adolph, MD  REASON FOR CONSULTATION: Lower GI bleed.  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 Susan Shepherd was in her usual state of health until she started having evidence of bright red blood. She actually noticed some red blood that that was dripping down her legs and her pants in her facility at [facility].   She presented to the Emergency Room with some hypertension and was admitted to the hospital for further management of her lower GI bleeding. Here in the hospital, she has had a mild decrease of her hemoglobin to 11. She had a very small amount of blood in a stool this morning, but otherwise the bleeding seems to have essentially resolved.   She does not have any prior history of colonoscopy. No family history of GI malignancy and no prior history of lower GI bleeding.   PAST MEDICAL HISTORY:  1.  Neuromyelitis optica. 2.  Paraplegia, wheelchair bound. 3.  Right eye blindness. 4.  Neurogenic bladder and bowel. 5.  External hemorrhoids.  HOME MEDICATIONS: Zofran, VESIcare, tizanidine, senna, prednisone, omeprazole, Myrbetriq, metformin, losartan, gabapentin, diazepam, Colace, cholecalciferol, calcium carbonate, Bactrim, baclofen, ascorbic acid, alendronate, acetaminophen.   ALLERGIES: No known drug allergies.  SOCIAL HISTORY: She is a resident at the [facility] facility.   REVIEW OF SYSTEMS: Ten system review conducted is negative except as stated in the HPI.  PHYSICAL EXAMINATION:  VITAL SIGNS: Currently temperature is 98.1, pulse is 92, respirations are 18, blood pressure 150/108, pulse  oximetry is 94% on room air. GENERAL: Alert and oriented times 4.  No acute distress. Appears stated age. HEENT: Normocephalic/atraumatic. Extraocular movements are intact. Anicteric. NECK: Soft, supple. JVP appears normal. No adenopathy. CHEST: Clear to auscultation. No wheeze or crackle. Respirations unlabored. HEART: Regular. No murmur, rub, or gallop.  Normal S1 and S2. ABDOMEN: Soft, nontender, nondistended.  Normal active bowel sounds in all four quadrants.  No organomegaly. No masses EXTREMITIES: No swelling, well perfused. SKIN: No rash or lesion. Skin color, texture, turgor normal. NEUROLOGICAL: Grossly intact. PSYCHIATRIC: Normal tone and affect. MUSCULOSKELETAL: No joint swelling or erythema.      DATA: She had a sodium of 145, potassium 4.2, BUN 19, creatinine 0.83. Lipase was normal. Liver enzymes were normal. Albumin is 3. White count is 7.3, hemoglobin currently is 10.6, which is down from 11.5, platelets are 72,000. The INR is 1.0. She did have a nuclear tagged red blood cell scan that was completely negative.  ASSESSMENT: Lower gastrointestinal bleed: Fortunately, it seems that the bleeding has fully resolved at this time. I had a long discussion with Ms. Norrington regarding whether to proceed with a colonoscopy. Because of her paraplegia and her poor mobility, her neurogenic bowel, a colonoscopy preparation would be extremely difficult and cumbersome for her.  We also discussed the possibility of just doing a flexible sigmoidoscopy to look for pathology lower down with an enema preparation that could likely explain her symptoms. She is much in favor of doing a flexible sigmoidoscopy instead of a colonoscopy. She understands that there is a risk of missing something higher up in her colon and is okay with this risk. We  also discussed inpatient versus outpatient on the flexible sigmoidoscopy and she would prefer to have it done inpatient.   RECOMMENDATIONS: 1.  We will plan for a  flexible sigmoidoscopy with enema prep likely on Monday. 2.  Okay to advance diet to regular at this time. 3.  Continue to monitor hemoglobin until stable.  4.  We will follow.  Thank you for this consult.           ____________________________ Dow AdolphMatthew Anis Degidio, MD mr:ts D: 10/18/2014 17:10:20 ET T: 10/18/2014 22:58:56 ET JOB#: [id number]  cc: Dow AdolphMatthew Rayetta Veith, MD, <Dictator> Kathalene FramesMATTHEW G Joannah Gitlin MD ELECTRONICALLY SIGNED 11/17/2014 14:17

## 2015-03-11 NOTE — H&P (Signed)
PATIENT NAME:  Susan Shepherd, Susan Shepherd MR#:  [id number] DATE OF BIRTH:  1951-01-22  DATE OF ADMISSION:  10/22/2014  PRIMARY CARE PHYSICIAN: Hinda Lenis, MD - [facility] COURSE: The patient is a 64 year old African American female with past medical history significant for history of recent admission and discharge from the hospital for gastrointestinal bleed, which was felt to be likely due to diverticular bleed, who underwent flexible sigmoidoscopy on 10/20/2014, which was poorly informative, presents back to the hospital after just 1 day of being home, with a recurrent rectal bleed. Apparently, the patient did not have bleeding in the hospital for 3-4 days. She had no bowel treatment, which she usually at home, and upon returning home today, she decided that she needed to have a bowel treatment, which she usually does with enemas.  As soon as she instilled the enema, she started having gushing of blood with stool from her rectum. It was a large amount of blood. She arrived back to the hospital for further evaluation. On arrival to the hospital, she was severely hypertensive with systolic blood pressure around 190s. The patient had a rectal exam done by the Emergency Room physician, who documented the patient having maroon stool in the rectal vault. Hospitalist services were contacted for admission.   The patient's vital signs remained stable. Hemoglobin remained stable and somewhat better than it was on the day of discharge, 10/21/2014.   PAST MEDICAL HISTORY: Significant for history of recent admission from the 10/17/2014 to 10/21/2014 for gastrointestinal bleed, status post flexible sigmoidoscopy done on 10/20/2014, with no significant information due to poor prep. It was felt that the patient had a diverticular bleed, and she was discharged home.  Her past medical history is also significant for external hemorrhoids, history of UTI, neuromyelitis optica since 2013, history of paraplegia due to  neuromyelitis optica, wheelchair bound. Her neurologist is at [facility]. History of right eye blindness as part of her neurologic illness. History of muscle spasms, as well as generalized muscle weakness. Functional plegia. Neurogenic bowel and bladder secondary to neuromyelitis optica. History of sicca syndrome. Also, the patient is diabetic.  MEDICATIONS: According to medical records, the patient is on Tylenol 325 mg 2 tablets every 4 hours as needed, alendronate70 mg p.o. weekly on Saturdays, ascorbic acid 500 mg p.o. twice daily, baclofen 10 mg tablets 3 tablets once at noon and 4 tablets twice a day, calcium carbonate 1250 mg once daily, Vitamin D 1000 units once daily, Colace 100 mg twice daily as needed, Desitin 13% topical cream to affected area as needed, diazepam 10 mg once daily as needed for nervousness, Enemmez Mini 238 mg rectal enema once daily as needed, EpiPen 0.3 mg once daily as needed, Feosol caplets 45 mg twice daily, gabapentin 300 mg 2 capsules at bedtime and 400 mg twice daily at 9:00 a.m. as well as noon, lidocaine topical gel 2% once daily as needed, lidocaine topical film 5% once daily, metformin 500 mg p.o. daily, Myrbetriq 25 mg p.o. at bedtime, omeprazole 40 mg p.o. daily, prednisone 40 mg p.o. daily, Senna 8.6 mg 2 tablets once at bedtime as needed, saw palmetto/Trimethoprim 1 tablet twice daily to be continued through 10/22/2014, tetrahydrozoline ophthalmologic solution 0.05%, 2 drops to each of affected eye 3 times daily, as needed for itchy eyes, tizanidine 2 mg twice daily together with 4 mg tablets which would be 6 mg total twice daily and 4 mg at noon, VESIcare 10 mg p.o. daily,  Zofran ODT 4 mg once daily  as needed.   ALLERGIES: BEE STINGS.   SOCIAL HISTORY: The patient lives at home. She has home health 7 days a week. She belongs to the [facility].   FAMILY HISTORY: Diabetes as well as hypertension.   REVIEW OF SYSTEMS:  CONSTITUTIONAL: Denies any pains, shortness of  breath, chest pains, fevers, fatigue, weakness, weight loss or gain.  EYES: Denies any blurry vision, double vision, glaucoma, or cataracts. Admits of having right eye blindness since a few years ago.  EARS, NOSE, AND THROAT: Denies any tinnitus, allergies, epistaxis, sinus pain, dentures, or difficulty swallowing.  RESPIRATORY: Denies any cough, wheezes, asthma or COPD. CARDIOVASCULAR: Denies any chest pains, orthopnea, edema, arrhythmias, palpitations, or syncope.  GASTROINTESTINAL: Denies any nausea, vomiting, diarrhea, abdominal pains. Admits of having rectal bleeding.  GENITOURINARY: Denies dysuria, hematuria, frequency, incontinence.  ENDOCRINE: Denies any polydipsia, nocturia, thyroid  problems, heat or cold intolerance, or thirst.  HEMATOLOGIC: Denies anemia, easy bruising or bleeding or swollen glands.  SKIN: Denies acne, rash, lesions, or change in moles.  MUSCULOSKELETAL: Denies arthritis, cramps, swelling.  NEUROLOGICAL: Admits to numbness and weakness in the lower extremities, minimal feeling in the lower extremities on the plantar surface of her feet.  PSYCHIATRIC: Admits having anxiety. Denies any insomnia or depression.   PHYSICAL EXAMINATION:  VITAL SIGNS: ON arrival to the hospital, the patient's vital signs were temperature 98.3, pulse was 91, respirations 18, blood pressure 192/118, oxygen saturation of 97% on room air. GENERAL: This is a well-developed, well-nourished, mildly obese female in no significant distress, lying on the stretcher.  HEENT: Her pupils were equal and reactive to light. Extraocular muscles intact. No icterus or conjunctivitis. Has normal hearing. No pharyngeal erythema. Mucosa is dry.  NECK: No masses. Supple, nontender. Thyroid is not enlarged. No adenopathy. No JVD or carotid bruit bilaterally. Full range of motion.  LUNGS: Clear to auscultation anteriorly. No rales, rhonchi, diminished breath sounds, or wheezing. No labored inspirations, increased  effort, dullness to percussion or overt respiratory distress.  CARDIOVASCULAR: S1, S2. Rhythm was regular. PMI not lateralized. Chest is nontender to palpation. Pedal pulses 1+.  No lower extremity edema, calf tenderness, or cyanosis was noted.  ABDOMEN: Soft, nontender. Bowel sounds are present. No organomegaly or masses were noted.  RECTAL: Deferred.  MUSCLE STRENGTH: Able to move upper extremities; not able to move lower extremities. No cyanosis, degenerative joint disease or kyphosis. Gait was not tested.  SKIN: Did not reveal any rashes, lesions, erythema, nodularity, or induration. It was warm and dry to palpation.  LYMPHATIC: No adenopathy in the cervical region.  NEUROLOGICAL: The patient has good sensory, intact in the upper extremities, in upper part of the body, as well as some feeling in the plantar surfaces of her feet; however, minimal and better on the right side. No dysarthria or aphasia. Deep tendon reflexes are diminished in the lower extremities. The patient is alert, oriented to time, person and place, cooperative. Memory is good. No significant confusion, agitation, or depression.  GENITOURINARY: The patient did have urinary retention on arrival to the Emergency Room, with catheterized urine of approximately 1.5 L.   LABORATORY DATA: BMP: Sodium 147, potassium 3.4; otherwise, unremarkable. Albumin level 3.4, total protein 5.8; otherwise, liver enzymes were normal. White blood cell count 7.0, hemoglobin 11.2, platelet count 106,000.   RADIOLOGIC STUDIES: None.   ELECTROCARDIOGRAM: Not done.   ASSESSMENT AND PLAN:  1.  Gastrointestinal bleed. Previously thought to be a diverticular bleed, and now is more suspicious for traumatic mucosal  injury due to enemas. Suggest MiraLax. We will continue IV fluids for now. We will check hemoglobin level every 6-8 hours. We will repeat bleeding scan, and gastroenterology consultation will be requested again.  2.  Malignant essential  hypertension. We will start the patient on lisinopril, and will advance it as needed.  3.  Hyponatremia. We will start the patient on D5 with half normal saline. We will follow a sodium level in the morning. We will also continue p.o. clear liquids.  4.  Hypokalemia. Supplementing IV. Check magnesium level.  5.  Muscle spasms and diabetes mellitus; continue her outpatient medications.   TIME SPENT: 50 minutes.     ____________________________ Katharina Caper, MD rv:MT D: 10/22/2014 16:51:48 ET T: 10/22/2014 17:24:50 ET JOB#: [id number]  cc: Katharina Caper, MD, <Dictator> Hinda Lenis, MD - [facility] Brodric Schauer MD ELECTRONICALLY SIGNED 11/25/2014 18:39

## 2015-03-15 NOTE — Discharge Summary (Signed)
PATIENT NAME:  Susan Shepherd, Susan Shepherd MR#:  [id number] DATE OF BIRTH:  08-Feb-1951  DATE OF ADMISSION:  12/12/2014 DATE OF DISCHARGE:  12/15/2014  PRIMARY CARE PHYSICIAN:  Dr. Ovid Curd at [facility] program.    ADMITTING COMPLAINT: Bright red blood per rectum.   DISCHARGE DIAGNOSES:  1. Acute lower gastrointestinal bleed status post embolization.  2. Acute anemia of blood loss.  3. Tachycardia.  4. Hyperthyroidism.  5. 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  6. Diabetes mellitus type 2 well controlled.  7. Hypertension.  8. Neuromyelitis optica.  9. Paraplegia.  10. Autonomic instability.  11. Neurogenic bladder.  12. Diverticulosis.  13. Thrombocytopenia.    CONSULTATIONS:  1. Dr. Bronson Ing, surgery.  2. Dr. Hortencia Pilar, vascular surgery.  3. Dr. Verdie Shire, gastroenterology.  4. Dr. Phoebe Perch, surgery.  5. Dr. Neoma Laming, cardiology.   PROCEDURES:  1.  Selective injection and embolization of the inferior medial mesenteric artery by Dr. Delana Meyer on 12/12/2014.  2.  Tagged red blood cell study 12/12/2014 shows findings consistent with low rectal active hemorrhage.  3.  2-D echocardiogram January 31 shows left ventricular ejection fraction of 30-35%. Elevated left atrial pressure. Impaired relaxation and pattern of LV diastolic filling. Decreased left ventricular internal cavity size. Normal right ventricular size and function.   HISTORY OF PRESENT ILLNESS: This 64 year old African-American female with past medical history of paraplegia, neuromyelitis optic, hypertension, hyperlipidemia, diverticulosis with a GI bleed in the past, presents with bright red blood per rectum. She states that she awoke in the middle of the night with sensation similar to bladder spasm, she looked down to self catheterize and noticed blood clots on the bed. She called EMS. Upon arrival to the Emergency Room she had 3 large volume bright red bowel  movements. She was given 3 liters of IV fluid resuscitation and 2 units of blood in the Emergency Department.   1.  Acute GI bleeding:  Tagged red blood cell scan showed acute rectal bleed. Surgery, vascular surgery, and gastroenterology were all consulted for further evaluation and treatment. She had an embolization procedure performed by Dr. Delana Meyer on January 29 which seemed to be successful. She had no further acute bleeding. She had some residual bleeding which by the time of discharge had stopped. She never had any abdominal pain. She will need to follow up with gastroenterology in the future for further recommendations on colonoscopies. She can resume her former bowel regimen at this time. It is very important that she avoid constipation or straining.  2.  Acute blood loss anemia due to GI blood loss: The patient received 3 units of packed red blood cells during hospitalization. Her presenting hemoglobin was 14 which dropped to 10.6 and returned to 12 after transfusion. On discharge her hemoglobin is 10.4.  3.  Hyperthyroidism: TSH was checked due to persistent tachycardia and was found to be low at 0.11, free T4 is 0.64. This will need to be evaluated in the outpatient setting and it may be contributing to her cardiomyopathy.  4.  New diagnosis of diastolic and systolic congestive heart failure with left ventricular ejection fraction of 30%. The patient was seen by Dr. Humphrey Rolls inpatient. She was started on standard regimen for congestive heart failure including carvedilol, losartan, digoxin. She will need to follow up with cardiology in the future. I have discussed this with her primary care provider who will make the referral.  5.  Diabetes mellitus type 2, well controlled: She  will continue on metformin.  6.  Neurogenic bladder with frequent urinary tract infections. She continues on Myrbetriq, Vesicare, and Bactrim for this. She did have some leukocytosis on her initial UA, but no culture was  obtained. This should be followed in the outpatient setting.  7.  Neuromyelitis optica: The patient will remain on prednisone and follow with primary care. 8.  Hypertension: The patient's blood pressure was actually slightly low during the admission and her amlodipine was stopped. This will be followed in an outpatient setting.  9.  Paraplegia: She can continue on her home regimen of baclofen, Neurontin, tizanidine as needed.  10.  Chronic constipation: The patient will continue with her home regimen of Enemeez, Colace, senna as needed for constipation. She is encouraged to have 1 bowel movement daily.  11. Thrombocytopenia: On patient presentation her platelets were 150,000 which have decreased throughout the admission to 61,000. This should be followed in the outpatient setting. She was not receiving any heparin products during the admission due to her GI bleed. She has no known history of thrombocytopenia.    DISCHARGE PHYSICAL EXAMINATION:  VITAL SIGNS: Temperature 98.7, heart rate 116, respirations 20, blood pressure 138/90, oxygenation 97% on room air.  GENERAL: No acute distress.  CARDIOVASCULAR: Tachycardic, regular, no murmurs, rubs, or gallops, no peripheral edema, peripheral pulses 2 +.  RESPIRATORY: Lungs clear to auscultation bilaterally with good air movement.  ABDOMEN: Soft, nontender, nondistended. Bowel sounds normal.  PSYCHIATRIC: The patient is alert and oriented x with very good insight into her clinical conditions.  MUSCULOSKELETAL: She has 5 out of 5 strength in her upper extremities and is unable to move her lower extremities due to paraplegia.   LABORATORY DATA: Sodium 143, potassium 3.5, chloride 112, bicarbonate 25, BUN 15, creatinine 0.67, glucose 207. LFTs, protein 6.1, albumin 3.1, bilirubin 0.4, alkaline phosphatase 116, AST 41, ALT 60. Cardiac enzymes slightly elevated at 0.14, 0.12, and 0.11 due to demand ischemia. White blood cells 7.7, hemoglobin 10.4, platelets  61,000, MCV 93.   CONDITION ON DISCHARGE: Stable.   DISPOSITION: Discharged to home with close followup by the [facility] program. She will see her primary care physician on Wednesday.   DISCHARGE MEDICATIONS:  1. Omeprazole 40 mg 1 capsule daily.  2. Ascorbic acid 500 mg 1 tablet twice a day.  3. EpiPen 2 pack 0.3 mg injectable kit injected once as needed for allergic reaction.  4. Tetrahydrolozine ophthalmic 0.5% 2 drops to each affected eye 3 times a day as needed for itchy eyes.  5. Calcium carbonate 1250 mg 1 tablet once a day.  6. Lidocaine topical 5% topical film applied to affected area as needed, can use 1/2 to 1 patch daily.  7. Alendronate 70 mg 1 tablet once a week on Saturday.  8. Baclofen 10 mg 4 tablets orally 2 times a day.  9. Colace sodium 100 mg 1 capsule 2 times a day as needed for constipation.  10. Gabapentin 400 mg 1 capsule twice a day at 9:00 and noon.    11. Lidocaine topical 2% topical gel to affected area once a day as needed.  12. Zofran ODT 4 mg 1 tablet every 8 hours as needed for nausea or vomiting.  13. Senna 8.6 mg 2 tablets once a day as needed for constipation.  14. Tizanidine 2 mg 1 tablet twice a day in the morning and at noon.   15.  4 mg 1 tablet 3 times a day in the morning and at noon and  at 6:00 p.m.  16. Enemeez mini 283 mg rectal enema once a day as needed for constipation.  17. Cholecalciferol 1000 units 1 tablet daily.  18. Prednisone 10 mg 4 tablets daily.  19. Gabapentin 300 mg 2 tablets once a day at bedtime.  20. Vesicare 10 mg 1 tablet once a day.  21. Metformin 500 mg 1 tablet once a day.  22. Sulfamethoxazole trimethoprim 1 tablet every 12 hours.  23. Feosol caplet 45 mg 1 tablet twice a day.  24. Myrbetriq 25 mg 2 tablets once a day at bedtime.  25. Baclofen 10 mg 3 tablets once a day at noon.   26. Losartan 50 mg 1 tablet orally once a day.  27. Digoxin 250 mcg 1 tablet once a day.  28. Carvedilol 12.5 mg 1 tablet twice a day.    DISCHARGE INSTRUCTIONS:   DIET: Heart healthy, low sodium, low carbohydrate diet.   ACTIVITY: As tolerated.   FOLLOWUP: She will follow up with her primary care physician on Wednesday, at that time she needs to be referred to a cardiologist. Decision needs to be made about endocrinology followup for hyperthyroidism. A CBC should be checked at that time to ensure that her hemoglobin is stable.   TIME SPENT ON DISCHARGE: 45 minutes.    ____________________________ Earleen Newport. Volanda Napoleon, MD cpw:bu D: 12/15/2014 15:34:51 ET T: 12/15/2014 16:51:55 ET JOB#: [id number]  cc: Barnetta Chapel P. Volanda Napoleon, MD, <Dictator> Dr. Ovid Curd at [facility] program [place] MD ELECTRONICALLY SIGNED 12/24/2014 18:26

## 2015-03-15 NOTE — Consult Note (Signed)
PATIENT NAME:  Susan Shepherd, Susan Shepherd MR#:  [id number] DATE OF BIRTH:  18-Nov-1950  DATE OF CONSULTATION:  12/12/2014  CONSULTING PHYSICIAN:  Carmie End, MD  PRIMARY CARE PHYSICIAN: Hinda Lenis, MD [facility]   CHIEF COMPLAINT: Rectal bleeding.   BRIEF HISTORY: Susan Shepherd is a 64 year old woman seen in the Emergency Room with a sudden onset of rectal bleeding. She was discharged from the hospital mid December with a similar problem. She has had a couple episodes in December 2015 with negative bleeding scans. Her hemoglobin stabilized in 7 to 8 range. She underwent colonoscopy and flexible sigmoidoscopy. This procedure demonstrated what was felt to be a severe area of diverticulosis in the sigmoid colon. Her bleeding stopped. She was setup for evaluation in [facility] where her primary care physician is with regard to possible solutions. She awoke this evening for her normal urinary catheterization and found she was sitting in a pool of blood. She denies any pain. She denies any dizziness or lightheadedness. She does not have any shortness of breath. She denies any abdominal tenderness. Surgery was consulted urgently in the Emergency Room.   She has history for severe lower extremity paraplegia felt to be related to neuromyelitis optica. She developed these symptoms in December 2013 and was at [facility] for nearly a month. Her paraplegia is not quite complete, but she does have significant muscle spasms and functional paraplegia. She also has significant bowel and bladder neurogenic problems. She is being evaluated for possible motility clinic intervention for her bladder and bowel problems. Possibility of colostomy has not been discussed with the patient. Her only previous surgery was a C-section. She does have a history of diabetes but no cardiac disease or thyroid problems. She denies any hypertension but she was hypertensive when she was admitted to the hospital a month ago. Her  medicines are well outlined in her last discharge summary.    REVIEW OF SYSTEMS: Otherwise unremarkable with exception of the findings noted above. She catheterizes herself every 5 to 6 hours to empty her bladder. She is nonambulatory. She does have home health on a daily basis to assist with the activities of daily living. She denies any upper extremity neurologic symptoms. She does have significant problems with vision in her right eye.   SOCIAL HISTORY: Negative for tobacco or alcohol usage. She lives at home, belongs to the [facility]: [place] Rhonda W.   PHYSICAL EXAMINATION:  GENERAL: She is an alert, comfortable woman. Denies any significant pain.  VITAL SIGNS: Heart rate is approximately 140, blood pressure is 120/80. She has oxygen saturation of 100%.  HEENT: No scleral icterus. She has no facial deformities.  NECK: Supple, nontender with midline trachea.  CHEST: Clear with no adventitious sounds. He has normal pulmonary excursion.  CARDIAC: No murmurs or gallops and she seems to be in normal sinus rhythm. However, her heart rate is very rapid so it is difficult to tell for sure.  ABDOMEN: Benign with no abdominal tenderness. She has no rebound, no guarding, no masses noted. She is sitting in a pool of blood and appears to be bleeding consistently.  EXTREMITIES: Lower extremity exam reveals almost complete paraplegia. She does demonstrate some sensation in her thighs but does not have any motion below her waist.  PSYCHIATRIC: Normal orientation, normal affect.   IMPRESSION: This woman has a repeat lower gastrointestinal bleed likely secondary to her previous diverticulosis. I would recommend admission to the intensive care unit with aggressive hydration. Her hemoglobin is 14 so  I would not transfuse her at this point. She would benefit from a bleeding scan to help confirm the source of the bleeding and possible consideration for vascular intervention. However, with a known site, colectomy might be a better  option particularly if she considers colostomy. At the present time she does not want to consider surgery def[facility]. We will continue to follow her while she is hospitalized. Antibiotic therapy may be of some benefit if she is developing any evidence of diverticulitis often a precursor of an active bleed. At the present time, I do not see any surgical indications for emergent colectomy. We will continue to be involved in her care.   ____________________________ Carmie Endalph L. Ely III, MD rle:AT D: 12/12/2014 05:36:07 ET T: 12/12/2014 06:13:12 ET JOB#: [id number]  cc: Carmie Endalph L. Ely III, MD, <Dictator> Scot Junobert T. Elliott, MD Quentin OreALPH L ELY MD ELECTRONICALLY SIGNED 12/12/2014 7:07

## 2015-03-15 NOTE — Consult Note (Signed)
PATIENT NAME:  Susan Shepherd, COLETTA MR#:  [id number] DATE OF BIRTH:  16-Jul-1951  DATE OF CONSULTATION:  12/12/2014  REFERRING PHYSICIAN:   CONSULTING PHYSICIAN:  Keturah Barre, NP  REASON FOR CONSULTATION: GI consult ordered by Dr. Allena Katz for evaluation of rectal bleeding.   HISTORY OF PRESENT ILLNESS: I appreciate consult for this 64 year old Caucasian woman with history of neuromyelitis optica with sequela of paraplegia from T6 down and right eye blindness, autonomic instability, diverticulitis and GI bleeding last month, for evaluation of further GI bleeding. The patient reports waking early this morning to self catheterize with urinary catheter, heard some gurgling, attributed this to urinary issues, but when she looked down she saw a large amount of bright red blood with some clots, passed some further bloody stools similar to this. States it has been slowing down somewhat now. Denies abdominal pain and further GI complaints. Had some rectal bleeding last month, underwent flexible sigmoidoscopy and colonoscopy by Dr. Shelle Iron as the flexible sigmoidoscopy was unrevealing. Colonoscopy found severe diverticulosis, internal and external hemorrhoids. There were no polyps found. She has had a GI bleeding scan earlier this morning, with positive bleeding in the lower pelvis, Dr. Michela Pitcher from surgery has been consulted as has Dr. Gilda Crease for vascular.  An embolization is planned for this afternoon.    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 Otherwise the patient says she is healthy and has no further symptoms.   PAST MEDICAL HISTORY: Autonomic instability, neurogenic bladder, neuromyelitis optica, diverticulitis, recent rectal bleeding episode. No surgeries.   SOCIAL HISTORY: Lives alone. Home health comes to aide. Family is  nearby. Every now and then has a glass of wine. No tobacco or illicits.   FAMILY HISTORY: Unknown. She is adopted.   MEDICATIONS: Tylenol 2 tablets p.o. q. 4 h. p.r.n., Fosamax 70 mg 1 tab weekly, amlodipine 10 mg daily, vitamin C 500 mg b.i.d., baclofen 10 mg 3 tabs at noon, calcium carbonate 1250 one tab daily, vitamin D 1000 international units daily, Colace 100 mg b.i.d., Enemeez mini rectal enema 1 q.a.m. p.r.n., EpiPen p.r.n., Feosol 45 mg p.o. b.i.d., gabapentin 600 mg at bedtime, gabapentin 400 mg b.i.d., lidocaine gel p.r.n., losartan 100 mg p.o. daily, metformin 500 mg p.o. daily, Myrbetriq 25 mg extended release 2 tabs at bedtime, omeprazole 40 mg daily, prednisone 10 mg 4 tablets daily, senna 2 tabs p.r.n. at bedtime, Bactrim 1 tab b.i.d., tetrahydrolozine eyedrops p.r.n.,  2 tabs p.r.n. VESIcare 10 mg p.o. daily, Zofran 4 mg q. 8 h. p.r.n.   ALLERGIES: Bee stings.   LABORATORY DATA:  Most recent laboratories, glucose 388, BUN 35, creatinine 1, sodium 142, potassium 4.7. GFR greater than 60. Calcium 8.7. Total protein 6.1, albumin 3, total bilirubin 0.4, ALP 116, AST 41, ALT 60. WBC 8.9, hemoglobin 10.6, hematocrit 31.9, platelet count 85,000, red cells normocytic, has received 3 units of packed red blood cells today. PT 12.7, INR 1.0. GI bleeding scan with findings consistent with low rectal active hemorrhage.   PHYSICAL EXAMINATION:  VITAL SIGNS: Most recent vital signs, temperature 98.5, pulse 125, respiratory rate 16, blood pressure 119/91, SaO2 97% on room air.  GENERAL: Well-appearing woman resting in bed, in no acute distress.  HEENT: Normocephalic, atraumatic. Conjunctivae pink. Sclerae are clear.  NECK: Supple. No adenopathy, thyromegaly.  CHEST: Respirations eupneic. Lungs clear.  CARDIAC: S1, S2, RRR.  No MRG. Trace generalized edema. Some tachycardia noted.  ABDOMEN: Obese abdomen. Bowel sounds x 4. Soft, nontender, nondistended. No hepatosplenomegaly,  masses, or other  abnormalities.  GENITOURINARY: There is a Foley catheter in place draining clear yellow urine. There is no blood currently on her bed sheets.  MUSCULOSKELETAL: Moves upper extremities very well. Strength 5 out of 5. No spontaneous motion to her lower extremities consistent with her history.  SKIN: Warm, dry, pink. No erythema, lesion, or rash.  EXTREMITIES: No clubbing or cyanosis or edema.  NEUROLOGIC: Alert, oriented x 3. Speech clear. No facial droop. Eyes track well.  PSYCHIATRIC: Pleasant, calm. Good insight. Logical train of thought.   IMPRESSION AND PLAN: Rectal bleeding likely diverticular. Agree with present.  Would stop pantoprazole drip at present and change to daily PPI as current bleeding is not likely due to PUD. Agree with following hemoglobin and transfusing p.r.n.   Thank you very much for this consult.   These services were provided by Vevelyn Pathristiane Airiana Elman, MSN, [facility], in collaboration with Lutricia FeilPaul Oh, MD, with whom I have discussed this patient in full.      ____________________________ Keturah Barrehristiane H. Cecia Egge, NP chl:bu D: 12/12/2014 18:31:05 ET T: 12/12/2014 19:12:11 ET JOB#: [id number]  cc: Keturah Barrehristiane H. Navjot Loera, NP, <Dictator> Eustaquio MaizeHRISTIANE H Kenton Fortin FNP ELECTRONICALLY SIGNED 12/29/2014 15:39

## 2015-03-15 NOTE — Consult Note (Signed)
Brief Consult Note: Diagnosis: Elevated troponin.   Consult note dictated.   Orders entered.   Comments: EKG/ECHO ordered.  Electronic Signatures: Radene KneeKhan, Ewel Lona Ali (MD)  (Signed 31-Jan-16 09:58)  Authored: Brief Consult Note   Last Updated: 31-Jan-16 09:58 by Radene KneeKhan, Jess Toney Ali (MD)

## 2015-03-15 NOTE — Op Note (Signed)
PATIENT NAME:  Susan Shepherd, Susan Shepherd MR#:  [id number] DATE OF BIRTH:  [birth year]  DATE OF PROCEDURE:  12/12/2014  PREOPERATIVE DIAGNOSIS: Gastrointestinal bleed.   POSTOPERATIVE DIAGNOSIS: Gastrointestinal bleed.   PROCEDURE PERFORMED:  1.  Selective injection of the inferior mesenteric artery.  2.  Micro bead embolization of the inferior mesenteric artery for control of hemorrhage, distal sigmoid.   SURGEON: Renford DillsGregory G. Schnier, MD   SEDATION: Versed plus fentanyl. Continuous ECG, pulse oximetry and cardiopulmonary monitoring was performed throughout the entire procedure by the interventional radiology nurse. Total sedation time was 1 hour.   ACCESS: A 5 French sheath, right common femoral artery.   FLUOROSCOPY TIME: 13.8 minutes.   CONTRAST USED: Isovue 50 mL.   INDICATIONS: [name] is a 64 year old woman who is quadriplegic and has had several episodes of bleeding diverticulosis. She presented to the Emergency Room with several large bowel movements of pure blood. She is deemed a poor surgical candidate, and therefore I have discussed embolization to try to control hemorrhage. The patient wishes to proceed. Risks and benefits were reviewed and all questions were answered.   DESCRIPTION OF PROCEDURE: The patient is taken to the angiography suite, placed in the supine position. After adequate sedation is achieved, her right groin is prepped and draped in sterile fashion. Ultrasound is placed in a sterile sleeve. Ultrasound is utilized secondary to lack of appropriate landmarks.   Common femoral artery is identified. It is echolucent and pulsatile indicating patency. Image is recorded for the permanent record. Under real-time visualization, the common femoral artery is accessed with a microneedle, microwire followed by micro sheath, J-wire followed by a 5 French sheath and 5 French pigtail catheter.   The pigtail catheter is positioned at the level of T12 and AP projection of the aorta is  obtained. Pigtail catheter is repositioned to above the bifurcation and oblique and magnified images demonstrate the origin of the IMA. A variety of catheters as well as wires are then used to select this. Ultimately, success is achieved with a KMP catheter and a Progreat. Progreat is then advanced down into the IMA tertiary branches extending towards the distal sigmoid. Hand injection of contrast is used to demonstrate the mesenteric anatomy, and subsequently 1 mL of 3:500 micron PVC beads is infused. Followup imaging demonstrates dramatic improvement in the blush with minimal if any residual bleeding identified. The Progreat is then repositioned slightly more proximally and a second 0.5 mL aliquot is used. Followup imaging is performed and subsequently the catheter is removed. Oblique view of the right groin is obtained and a StarClose device deployed without difficulty. There are no immediate complications.   INTERPRETATION: Initially there is a blush noted in the distal sigmoid area, following installation of 1.5 mL of beads there appears to be a dramatic improvement.   SUMMARY: Successful embolization the inferior mesenteric artery.     ____________________________ Renford DillsGregory G. Schnier, MD ggs:bm D: 12/12/2014 21:02:52 ET T: 12/13/2014 01:08:50 ET JOB#: [id number]  cc: Renford DillsGregory G. Schnier, MD, <Dictator> Renford DillsGREGORY G SCHNIER MD ELECTRONICALLY SIGNED 12/17/2014 17:44

## 2015-03-15 NOTE — H&P (Signed)
PATIENT NAME:  Susan Shepherd, Susan Shepherd MR#:  [id number] DATE OF BIRTH:  02/04/1951  DATE OF ADMISSION:  12/12/2014   REFERRING PHYSICIAN:  Hinda Kehr, MD   PRIMARY CARE PHYSICIAN: Dr. Ovid Curd.   ADMISSION DIAGNOSIS: Gastrointestinal bleed.   HISTORY OF PRESENT ILLNESS: This is a 64 year old African American female who presents to the Emergency Department via EMS for GI bleed. The patient is paraplegic and awoke in the middle of the night with some abdominal sensation that she thought might be some bladder spasm that she is accustomed to. When she looked down to self-catheterize, she noticed blood and clots in the bed which, of course frightened her and prompted her to call EMS.  By the time the patient arrived to the Emergency Department, she had at least 3 episodes of large-volume bright red bleeding per rectum, which we estimated at approximately 100 to 150 mL of blood loss each time.  In the Emergency Department, the patient's blood pressure was fairly labile, albeit it should be noted the patient has autonomic instability at baseline.  She was given 3 liters of fluid resuscitation and given 2 units of blood in the Emergency Department.  She has been crossmatched for at least 4 more units of blood.  Surgery team has been notified as well as gastroenterology.  Due to the patient's brisk GI bleed and unstable condition, the Emergency Department called for admission.   REVIEW OF SYSTEMS:  CONSTITUTIONAL: The patient denies fever or weakness in her upper extremities.  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  HEENT: Denies tinnitus or sore throat.  RESPIRATORY: Denies shortness of breath or cough.  CARDIOVASCULAR: Denies chest pain, palpitations, orthopnea, or paroxysmal nocturnal dyspnea.  GASTROINTESTINAL: Denies nausea or vomiting. The patient's sensation level is T6 thus, she does not have any abdominal pain.  GENITOURINARY: The patient has a  neurogenic bladder and self-catheterizes. She does not have any sensation to associate with dysuria, increased frequency, or hesitancy of urination.  ENDOCRINE: Denies polyuria or polydipsia.  HEMATOLOGIC AND LYMPHATIC: Denies easy bruising, but admits to bleeding at this time.  INTEGUMENT: Denies rashes or lesions.  MUSCULOSKELETAL: Denies arthralgias or myalgias.  NEUROLOGIC: The patient is paraplegic from T6 downward. She denies any dysarthria or any new paresthesias.  PSYCHIATRIC: The patient denies suicidal ideation or depression.   PAST MEDICAL HISTORY: Autonomic instability, neurogenic bladder, neuromyelitis optica, diverticulitis and history of GI bleed.    PAST SURGICAL HISTORY: None.   SOCIAL HISTORY: The patient lives alone. She has a nursing aide a couple of hours per day. She occasionally has a glass of wine. She denies smoking or any illicit drug use.   FAMILY HISTORY: Unknown, as the patient is adopted.   MEDICATIONS:  1.  Acetaminophen 325 mg 2 tablets p.o. every 4 hours as needed for pain.  2.  Alendronate 70 mg 1 tablet p.o. once weekly on Saturday.  3.  Amlodipine 10 mg 1 tablet p.o. daily.  4.  Ascorbic acid 500 mg 1 tablet p.o. b.i.d.  5.  Baclofen 10 mg 3 tablets p.o. daily at noon.  6.  Calcium carbonate 1250 mg 1 tablet p.o. daily.  7.  Cholecalciferol 1000 international units 1 tablet p.o. daily.  8.  Colace 100 mg 1 capsule p.o. b.i.d.  9.  Enemeez Mini 283 mg rectal enema, 1 enema per rectum every morning as needed for constipation.  10. EpiPen 2 packs 0.3 mg injectable kit as needed  for allergic reaction.  11. Feosol caplets 45 mg 1 tablet p.o. b.i.d.  12. Gabapentin 300 mg 2 capsules p.o. at bedtime.  13. Gabapentin 400 mg 1 capsule p.o. b.i.d. at 9 a.m. and noon.  14. Lidocaine topical gel 2% with applicator applied to affected area once a day as needed.  15. Losartan 100 mg 1 tablet p.o. daily.  16. Metformin 500 mg 1 tablet p.o. daily.  17. Myrbetriq  25 mg extended release 2 tablets p.o. at bedtime.  18. Omeprazole 40 mg delayed release capsule, 1 capsule p.o. daily.  19. Prednisone 10 mg 4 tablets p.o. daily.  20. Senna 8.6 mg 2 tablets p.o. at bedtime as needed for constipation.  21. Sulfamethoxazole trimethoprim 1 tablet p.o. b.i.d.  22. Tetra hydralazine ophthalmic ointment 0.05% solution two drops to the affected eye 3 times a day as needed for itchy eyes.  23. Tizanidine 2 mg 1 tablet p.o. b.i.d. in the morning and at noon with a 4 mg tablet.  24. Tizanidine 4 mg 1 tablet t.i.d., in the morning, at noon and at 6:00 p.m.  25. VESIcare 10 mg 1 tablet p.o. daily.  26. Zofran ODT 4 mg 1 tablet sublingually every 8 hours as needed for nausea and vomiting.   ALLERGIES: BEE STINGS.  PERTINENT LABORATORY RESULTS AND RADIOGRAPHIC FINDINGS: Serum glucose is 388, BUN is 35, creatinine is 1, serum sodium 142, potassium 4.7, chloride 107, bicarbonate 28, calcium 8.7, serum albumin 3, alkaline phosphatase 116, AST is 41, ALT is 60, white blood cell count is 11.1, hemoglobin is 14.3, hematocrit is 43.4, platelet count is 150,000.  MCV is 99. INR is 1. Urinalysis shows nine red blood cells per high-power field, 11 white blood cells per high-power field, no bacteria is seen.   There is no imaging.   PHYSICAL EXAMINATION:  VITAL SIGNS: Temperature is 98.3, pulse 139, respirations 24, blood pressure 121/96, pulse oximetry is 96% on room air before blood transfusion.  GENERAL: The patient is alert and oriented x 3 in no apparent distress.  HEENT: Normocephalic, atraumatic. Pupils are equal, round, and reactive to light and accommodation. Extraocular movements are intact.  However, there is some palsy of the left eye.  Mucous membranes are moist.  NECK: Trachea is midline. No adenopathy.  CHEST: Symmetric, atraumatic.  CARDIOVASCULAR: Regular rate and rhythm. Normal S1, S2. No rubs, clicks, or murmurs appreciated.  LUNGS: Clear to auscultation  bilaterally. Normal effort and excursion.  ABDOMEN: Positive bowel sounds. Soft, nontender, nondistended. No hepatosplenomegaly.  GENITOURINARY: Normal external female genitalia.  MUSCULOSKELETAL: The patient moves upper extremities normally with full range of motion, strength was not tested. SKIN:  Warm and dry. There are no rashes or lesions.  EXTREMITIES: No clubbing, cyanosis, or edema.  NEUROLOGIC: Cranial nerves II through XII with the exception of cranial nerve III on the left, the patient has a sensory level of T6.  PSYCHIATRIC: Mood is normal. Affect is congruent. The patient has excellent insight into her medical condition and sound judgment and decision-making.   ASSESSMENT AND PLAN: This is a 64 year old female admitted for gastrointestinal bleed.  1.  Gastrointestinal bleed.  The patient is mentating well, but her blood pressure is trending downward. Her hemoglobin and hematocrit has not yet equilibrated, but she is stable at the present moment.  She is receiving blood transfusions and is crossmatched for multiple units if needed.  I started a pantoprazole drip and admitted the patient to the Intensive Care Unit.  The surgery service has  been consulted and gastroenterology is aware of the patient's status.  We will obtain a red blood cell scan at this time, although its utility may be in doubt at this moment as she is bleeding so briskly presumably from diverticulitis from which she has bled in the past that the results of the scan may show only a large blush of radionucleotide tracer tagged blood cells. We will await further gastrointestinal and surgery input.  2.  Paraplegia. The patient has been self-catheterizing.  Her urinalysis had the appearance of urinary tract infection, but that there is no bacteria seen. The patient has been on prophylactic antibiotics as an outpatient, thus her urinalysis is likely reflective of sterile pyuria, but I will give ceftriaxone and vancomycin at this  time due to septic criteria and possible bowel perforation.  3.  Sepsis. The patient meets criteria via tachycardia, tachypnea, and leukocytosis.  I have ordered antibiotics as above.  I have also started the patient on stress dose steroids as she has been on daily prednisone for her neuromyelitis optica prior to admission.  4.  Obesity. The patient's BMI is 36.7. I have encouraged a healthy diet after she is discharged from the hospital.  5.  Deep vein thrombosis prophylaxis. Sequential compression devices.  6.  Gastrointestinal prophylaxis, pantoprazole drip as stated above.    The patient is a FULL CODE.   TIME SPENT ON ADMISSION ORDERS AND CRITICAL PATIENT CARE: Approximately 45 minutes.     ____________________________ Norva Riffle. Marcille Blanco, MD msd:DT D: 12/12/2014 07:52:23 ET T: 12/12/2014 08:27:02 ET JOB#: [id number]  cc: Norva Riffle. Marcille Blanco, MD, <Dictator> Norva Riffle Maiana Hennigan MD ELECTRONICALLY SIGNED 12/17/2014 7:14

## 2015-03-15 NOTE — Consult Note (Signed)
Brief Consult Note: Diagnosis: GI Bleed.   Patient was seen by consultant.   Recommend to proceed with surgery or procedure.   Comments: patient has persistent bleeding and a Nuc scan that is positive for distal sigmoid bleeding.  She is poor surgical candidate and I have discussed the riska and benefits as well as the alternatives with her.  She wishes to proceed with embolization to try to prevent further hemorhage.  Electronic Signatures: Levora DredgeSchnier, Mylah Baynes (MD)  (Signed 29-Jan-16 20:31)  Authored: Brief Consult Note   Last Updated: 29-Jan-16 20:31 by Levora DredgeSchnier, Chirsty Armistead (MD)

## 2015-03-15 NOTE — Consult Note (Signed)
Pt being wheeled down to specials now. Please see C. London's notes. Pt with recurrent rectal bleeding with significant drop in hgb. Pt with severe diverticulosis. Positive bleeding scan. Likely diverticular bleed. Colonoscopy/flex sigmoidoscopy is unlikely to help in this situation. Angiography with possible embolization is the treatment of choice. Will check on patient tomorrow AM unless condition worsens tonight. thanks.   Electronic Signatures: Lutricia Feilh, Nnamdi Dacus (MD)  (Signed on 29-Jan-16 15:08)  Authored  Last Updated: 29-Jan-16 15:08 by Lutricia Feilh, Gabby Rackers (MD)

## 2015-03-15 NOTE — Consult Note (Signed)
PATIENT NAME:  Susan Shepherd, Susan Shepherd MR#:  [id number] DATE OF BIRTH:  [birth year]  DATE OF CONSULTATION:  12/14/2014  REFERRING PHYSICIAN:   CONSULTING PHYSICIAN:  Laurier NancyShaukat A. Alona Danford, MD  INDICATION FOR CONSULTATION: Elevated troponin.   HISTORY OF PRESENT ILLNESS: This is a 64 year old, African American female, with a past medical history of hypertension, hyperlipidemia, who is paralyzed, waist down, presented to the Emergency Room with a GI bleed. She was seen by Dr. Bluford Kaufmannh and apparently had GI bleeding source embolized. She continues to be tachycardic and her troponins were mildly elevated; thus, I was asked to evaluate the patient. She denies any chest pain or shortness of breath, feeling actually much better. No further GI episodes.   PAST MEDICAL HISTORY: Autonomic instability, neurogenic bladder, neuromyelitis optica, diverticulitis, history of suicidal ideation, and paralysis waist down.   SOCIAL HISTORY: No history of EtOH abuse or smoking.   FAMILY HISTORY: Positive for coronary artery disease.   PHYSICAL EXAMINATION:  GENERAL: She is alert and oriented x 3, in no acute distress.  VITAL SIGNS: Blood pressure is 155/104, respirations 20, pulse 113, temperature 98.2, saturation 99.  NECK: Revealed no JVD.  LUNGS: Clear.  HEART: Regular rate and rhythm. Normal S1, S2. No audible murmur.  ABDOMEN: Soft, nontender, positive bowel sounds.  EXTREMITIES: No pedal edema.  NEUROLOGIC: She appears to have paralysis waist down. Her right eye, she cannot see.   LABORATORY DATA: BUN of 17, creatinine of 0.62.   Her troponin, the last one is 0.11, it is trending down. Troponin, the second set was 0.12 and the first set was 0.14.   EKG was not done.   ASSESSMENT AND PLAN: The patient has mildly elevated troponin in the setting of a gastrointestinal bleed. Advise getting an EKG and an echocardiogram done. She is not having any chest pain. Most likely the gastrointestinal bleed was stress induced  leading to mildly elevated troponin. It is trending down. Outpatient work-up can be done, but we will do an echocardiogram to further evaluate the wall motion abnormalities, and also do an EKG, to see if there is any acute ischemia, but I think she might be able to go home with a follow-up in the office next week. I gave her my office card.   Thank you very much for the referral.    ____________________________ Laurier NancyShaukat A. Huck Ashworth, MD sak:JT D: 12/14/2014 09:57:19 ET T: 12/14/2014 11:01:47 ET JOB#: [id number]  cc: Laurier NancyShaukat A. Twilia Yaklin, MD, <Dictator> Laurier NancySHAUKAT A Earnie Bechard MD ELECTRONICALLY SIGNED 01/15/2015 12:13

## 2015-03-15 NOTE — Consult Note (Signed)
General Aspect GI bleed with history of diverticulosis   Present Illness The patient is a 64 year old woman who presents to the Emergency Department via EMS for GI bleed. The patient is paraplegic and awoke in the middle of the night with some abdominal sensation that she thought might be some bladder spasm that she is accustomed to. When she looked down to self-catheterize, she noticed blood and clots in the bed which, of course frightened her and prompted her to call EMS.  By the time the patient arrived to the Emergency Department, she had at least 3 episodes of large-volume bright red bleeding per rectum, which we estimated at approximately 100 to 150 mL of blood loss each time.  In the Emergency Department, the patient's blood pressure was fairly labile, albeit it should be noted the patient has autonomic instability at baseline.  She was given 3 liters of fluid resuscitation and given 2 units of blood in the Emergency Department.  She has been crossmatched for at least 4 more units of blood.  Bleeding scan has shows bleeding to be in the distal sigmoid.  PAST MEDICAL HISTORY: Autonomic instability, neurogenic bladder, neuromyelitis optica, diverticulitis and history of GI bleed.    PAST SURGICAL HISTORY: C-Section.   Home Medications: Medication Instructions Status  losartan 100 mg oral tablet 1 tab(s) orally once a day Active  Feosol Caplet 45 mg oral tablet 1 tab(s) orally 2 times a day Active  sulfamethoxazole-trimethoprim 1 tab(s) orally every 12 hours Active  Myrbetriq 25 mg oral tablet, extended release 2 tab(s) orally once a day (at bedtime) Active  amLODIPine 10 mg oral tablet 1 tab(s) orally once a day Active  omeprazole 40 mg oral delayed release capsule 1 cap(s) orally once a day Active  ascorbic acid 500 mg oral tablet 1 tab(s) orally 2 times a day Active  EpiPen 2-Pak 0.3 mg injectable kit 0.3 milligram(s) injectable once, As Needed for allergic reaction Active   tetrahydrozoline ophthalmic 0.05% ophthalmic solution 2 drop(s) to each affected eye 3 times a day, As Needed for itchy eyes Active  calcium carbonate 1250 mg oral tablet 1 tab(s) orally once a day Active  lidocaine topical 5% topical film Apply topically to affected area once a day, As Needed can use 0.5-1 patch Active  alendronate 70 mg oral tablet 1 tab(s) orally once a week on saturday Active  baclofen 10 mg oral tablet 3 tab(s) orally once a day at noon Active  baclofen 10 mg oral tablet 4 tab(s) orally 2 times a day Active  Colace sodium 100 mg oral capsule 1 cap(s) orally 2 times a day, As Needed - for Constipation Active  gabapentin 400 mg oral capsule 1 cap(s) orally 2 times a day at 9am and noon Active  lidocaine topical 2% topical gel with applicator Apply topically to affected area once a day, As Needed Active  Zofran ODT 4 mg oral tablet, disintegrating 1 tab(s) orally every 8 hours, As Needed - for Nausea, Vomiting Active  Senna 8.6 mg oral tablet 2 tab(s) orally once a day (at bedtime), As Needed - for Constipation Active  tiZANidine 2 mg oral tablet 1 tab(s) orally 2 times a day in morning and at noon with 52m tab Active  tiZANidine 4 mg oral tablet 1 tab(s) orally 3 times a day (in morning and at noon take with 2108mtab) and at 6pm Active  Enemeez Mini 283 mg rectal enema 1 each rectal once a day (in the morning), As Needed -  for Constipation Active  cholecalciferol 1000 intl units oral tablet 1 tab(s) orally once a day Active  acetaminophen 325 mg oral tablet 2 tab(s) orally every 4 hours, As Needed - for Pain Active  predniSONE 10 mg oral tablet 4 tab(s) orally once a day Active  gabapentin 300 mg oral capsule 2 cap(s) orally once a day (at bedtime) Active  VESIcare 10 mg oral tablet 1 tab(s) orally once a day (in addition to myrbetriq) Active  metFORMIN 500 mg oral tablet 1 tab(s) orally once a day Active    Bee Stings: Unknown  Case History:  Family History  Non-Contributory   Social History negative tobacco, negative ETOH, negative Illicit drugs   Review of Systems:  ROS No TIA/stroke/seizure No heat or cold intolerance No dysuria/hematuria No blurry or double vision No tinnitus or ear pain No rashes or ulcer No suicidal ideation or psychosis No signs of bleeding or easy bruising No SOB/DOE, orthopnea, or sputum No palpitations or chest pain No N/V/D or abdominal pain No joint pain or joint swelling No fever or chills No unintentional weight loss or gain   Physical Exam:  GEN well developed, obese   HEENT hearing intact to voice, dry oral mucosa   NECK supple  trachea midline   RESP normal resp effort  no use of accessory muscles   CARD irregular rate  no JVD   ABD denies tenderness  soft  obese   EXTR negative cyanosis/clubbing, negative edema   SKIN No rashes, No ulcers   NEURO cranial nerves intact, R side weakness, L side weakness, follows commands, spastic   PSYCH alert, good insight   Nursing/Ancillary Notes: **Vital Signs.:   29-Jan-16 13:00  Vital Signs Type Routine  Pulse Pulse 125  Pulse source if not from Vital Sign Device per cardiac monitor  Respirations Respirations 16  Systolic BP Systolic BP 378  Diastolic BP (mmHg) Diastolic BP (mmHg) 91  Mean BP 102  BP Source  if not from Vital Sign Device non-invasive  Pulse Ox % Pulse Ox % 97  Pulse Ox Activity Level  At rest  Oxygen Delivery Room Air/ 21 %  Pulse Ox Heart Rate 124   Hepatic:  29-Jan-16 04:35   Bilirubin, Total 0.4  Alkaline Phosphatase 116  SGPT (ALT) 60  SGOT (AST)  41  Total Protein, Serum  6.1  Albumin, Serum  3.0  Routine BB:  29-Jan-16 04:35   Crossmatch Unit 1 Ready  Crossmatch Unit 1 Transfused  Crossmatch Unit 2 Ready  Crossmatch Unit 2 Transfused  Result(s) reported on 13 Dec 2014 at [place] Unit 3 Ready  Crossmatch Unit 4 Ready (Result(s) reported on 12 Dec 2014 at 06:32AM.)  ABO Group + Rh Type O  Positive  Antibody Screen NEGATIVE (Result(s) reported on 12 Dec 2014 at 06:24AM.)  Routine Chem:  29-Jan-16 04:35   Result Comment POTASSIUM/CREATININE/BUN - Slight hemolysis, interpret results with AST/TOTAL PROTEIN - caution.  Result(s) reported on 12 Dec 2014 at 05:13AM.  Glucose, Serum  388  BUN  35  Creatinine (comp) 1.00  Sodium, Serum 142  Potassium, Serum 4.7  Chloride, Serum 107  CO2, Serum 28  Calcium (Total), Serum 8.7  Osmolality (calc) 307  eGFR (African American) >60  eGFR (Non-African American)  60 (eGFR values <80m/min/1.73 m2 may be an indication of chronic kidney disease (CKD). Calculated eGFR, using the MRDR Study equation, is useful in  patients with stable renal function. The eGFR calculation will not be reliable in  acutely ill patients when serum creatinine is changing rapidly. It is not useful in patients on dialysis. The eGFR calculation may not be applicable to patients at the low and high extremes of body sizes, pregnant women, and vegetarians.)  Anion Gap 7    20:37   Result Comment TROPONIN - RESULTS VERIFIED BY REPEAT TESTING.  - [facility] CALLED MONIQUE ROBERSON  - @ 2125 12/12/14  - READ-BACK PROCESS PERFORMED.  Result(s) reported on 12 Dec 2014 at 09:34PM.    23:57   Result Comment TROPONIN - RESULTS VERIFIED BY REPEAT TESTING.  - ELEVATED TROPONIN PREVIOUSLY CALLED AT  - 2125 12/12/14.PMH  Result(s) reported on 13 Dec 2014 at 12:47AM.  Cardiac:  29-Jan-16 20:37   Troponin I  0.14 (0.00-0.05 0.05 ng/mL or less: NEGATIVE  Repeat testing in 3-6 hrs  if clinically indicated. >0.05 ng/mL: POTENTIAL  MYOCARDIAL INJURY. Repeat  testing in 3-6 hrs if  clinically indicated. NOTE: An increase or decrease  of 30% or more on serial  testing suggests a  clinically important change)  CPK-MB, Serum  8.0 (Result(s) reported on 12 Dec 2014 at 09:13PM.)    23:57   Troponin I  0.12 (0.00-0.05 0.05 ng/mL or less: NEGATIVE  Repeat testing in 3-6 hrs  if  clinically indicated. >0.05 ng/mL: POTENTIAL  MYOCARDIAL INJURY. Repeat  testing in 3-6 hrs if  clinically indicated. NOTE: An increase or decrease  of 30% or more on serial  testing suggests a  clinically important change)  CPK-MB, Serum  8.4 (Result(s) reported on 13 Dec 2014 at 12:41AM.)  Routine UA:  29-Jan-16 05:53   Color (UA) Yellow  Clarity (UA) Clear  Glucose (UA) >=500  Bilirubin (UA) Negative  Ketones (UA) Negative  Specific Gravity (UA) 1.025  Blood (UA) Negative  pH (UA) 5.0  Protein (UA) Negative  Nitrite (UA) Negative  Leukocyte Esterase (UA) Trace (Result(s) reported on 12 Dec 2014 at 06:54AM.)  RBC (UA) 9 /HPF  WBC (UA) 11 /HPF  Bacteria (UA) NONE SEEN  Epithelial Cells (UA) NONE SEEN  Mucous (UA) PRESENT (Result(s) reported on 12 Dec 2014 at 06:54AM.)  Routine Coag:  29-Jan-16 04:35   Activated PTT (APTT)  23.0 (A HCT value >55% may artifactually increase the APTT. In one study, the increase was an average of 19%. Reference: "Effect on Routine and Special Coagulation Testing Values of Citrate Anticoagulant Adjustment in Patients with High HCT Values." American Journal of Clinical Pathology 2006;126:400-405.)  Prothrombin 12.7  INR 1.0 (INR reference interval applies to patients on anticoagulant therapy. A single INR therapeutic range for coumarins is not optimal for all indications; however, the suggested range for most indications is 2.0 - 3.0. Exceptions to the INR Reference Range may include: Prosthetic heart valves, acute myocardial infarction, prevention of myocardial infarction, and combinations of aspirin and anticoagulant. The need for a higher or lower target INR must be assessed individually. Reference: The Pharmacology and Management of the Vitamin K  antagonists: the seventh ACCP Conference on Antithrombotic and Thrombolytic Therapy. VQMGQ.6761 Sept:126 (3suppl): [id number]. A HCT value >55% may artifactually increase the PT.  In one study,   the increase was an average of 25%. Reference:  "Effect on Routine and Special Coagulation Testing Values of Citrate Anticoagulant Adjustment in Patients with High HCT Values." American Journal of Clinical Pathology 2006;126:400-405.)  Routine Hem:  29-Jan-16 04:35   WBC (CBC)  11.1  RBC (CBC) 4.37  Hemoglobin (CBC) 14.3  Hematocrit (CBC) 43.4  Platelet Count (CBC) 150  MCV  99  MCH 32.7  MCHC 33.0  RDW 14.4  Neutrophil % 90.9  Lymphocyte % 4.0  Monocyte % 4.5  Eosinophil % 0.0  Basophil % 0.6  Neutrophil #  10.1  Lymphocyte #  0.4  Monocyte # 0.5  Eosinophil # 0.0  Basophil # 0.1 (Result(s) reported on 12 Dec 2014 at 05:13AM.)    06:08   WBC (CBC) 8.9  RBC (CBC)  3.21  Hemoglobin (CBC)  10.6  Hematocrit (CBC)  31.9  Platelet Count (CBC)  85 (Result(s) reported on 12 Dec 2014 at 08:30AM.)  MCV 100  MCH 33.0  MCHC 33.1  RDW 13.9   Nuclear Med:    29-Jan-16 11:59, GI Blood Loss Study - Nuc Med  GI Blood Loss Study - Nuc Med   REASON FOR EXAM:    brisk GI bleed; BRBPR  COMMENTS:       PROCEDURE: NM  - NM GI BLOOD LOSS STUDY  - Dec 12 2014 11:59AM     CLINICAL DATA:  Bright red blood per rectum.    EXAM:  NUCLEAR MEDICINE GASTROINTESTINAL BLEEDING SCAN    TECHNIQUE:  Sequential abdominal images were obtained following intravenous  administration of Tc-77mlabeled red blood cells.    RADIOPHARMACEUTICALS:  24.9 mCi Tc-960mn-vitro labeled red cells.  COMPARISON:  10/22/2014 and 10/17/2014    FINDINGS:  There is fairly immediate tubular activity noted in the central low  pelvis which is most likely due to active rectal bleeding. No other  areas of active bleeding are identified.     IMPRESSION:  Findings consistent with low rectal active hemorrhage.      Electronically Signed    By: MaKalman Jewels.D.    On: 12/12/2014 12:44     Verified By: PEMarlane HatcherM.D.,    Impression 1.  Gastrointestinal bleed.  The patient is mentating well, but her  blood pressure is trending downward.  She is receiving blood transfusions and is crossmatched for multiple units if needed.  I started a pantoprazole drip and admitted the patient to the Intensive Care Unit.  The red blood cell scan shows disltal sigmoid colon as thelikel source.  I have explained the risks and benefits of angiography and embolization.  All questions were answered. Alternatives were discussed.  The patient would like to proceed with embolization. 2.  Paraplegia. The patient has been self-catheterizing.  Her urinalysis had the appearance of urinary tract infection, but that there is no bacteria seen. The patient has been on prophylactic antibiotics as an outpatient, thus her urinalysis is likely reflective of sterile pyuria, but I will give ceftriaxone and vancomycin at this time due to septic criteria and possible bowel perforation.  3.  Sepsis. The patient meets criteria via tachycardia, tachypnea, and leukocytosis.  I have ordered antibiotics as above.  I have also started the patient on stress dose steroids as she has been on daily prednisone for her neuromyelitis optica prior to admission.  4.  Obesity. The patient's BMI is 36.7. I have encouraged a healthy diet after she is discharged from the hospital.   Plan level 3 consult   Electronic Signatures: ScHortencia PilarMD)  (Signed 30-Jan-16 20:51)  Authored: General Aspect/Present Illness, Home Medications, Allergies, History and Physical Exam, Vital Signs, Labs, Radiology, Impression/Plan   Last Updated: 30-Jan-16 20:51 by ScHortencia PilarMD)

## 2015-03-15 NOTE — Consult Note (Signed)
Brief Consult Note: Diagnosis: GIB.   Patient was seen by consultant.   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 Patient reports waking early this am to self catheterize with urinary cath, heard some gurgling- attributed this to urinary issues, but when she looked down, saw large amount of bright red blood with some clots. Passed further episodes. States it has been slowing somewhat now. Denies abdominal pain and further GI complaints. Had some rectal bleeding last month: underwent flex sig and colonoscopy by Dr Shelle Ironein as f/s unrevealing. Colonoscopy found severe divereticulosis, internal and external hemorrhoids. there were no polyps found. Has had GIB scan this am with positive bleeding in the lower pelvis. Dr Michela PitcherEly from surgery has consulted, as has Dr Gilda CreaseSchnier from vascular. An embolization is planned for this afternoon.  Impression and plan: rectal bleeding- likely diverticular. Agree with present. Would stop Pantoprazole gtt at present and change to daily po ppi as current bleeding is not likely due to PUD. Agree with following hgb and transfusing prn..  Electronic Signatures: Vevelyn PatLondon, Shakeyla Giebler H (NP)  (Signed 29-Jan-16 14:40)  Authored: Brief Consult Note   Last Updated: 29-Jan-16 14:40 by Keturah BarreLondon, Lashala Laser H (NP)

## 2015-03-15 NOTE — Consult Note (Signed)
Chief Complaint:  Subjective/Chief Complaint No active bleeding since embolization. Passage of old blood earlier this AM. Hgb stable overnight. Hungry. No food over 24 hours. No abdominal pain.   VITAL SIGNS/ANCILLARY NOTES: **Vital Signs.:   30-Jan-16 07:00  Temperature Temperature (F) 98.8  Celsius 37.1  Temperature Source oral  Pulse Pulse 116  Respirations Respirations 14  Systolic BP Systolic BP 156  Diastolic BP (mmHg) Diastolic BP (mmHg) 115  Mean BP 128  Pulse Ox % Pulse Ox % 100  Pulse Ox Activity Level  At rest  Oxygen Delivery Room Air/ 21 %   Brief Assessment:  GEN no acute distress   Cardiac Regular   Respiratory clear BS   Gastrointestinal Normal   Lab Results: Thyroid:  30-Jan-16 04:04   Thyroid Stimulating Hormone  0.11 (0.45-4.50 (IU = International Unit)  ----------------------- Pregnant patients have  different reference  ranges for TSH:  - - - - - - - - - -  Pregnant, first trimetser:  0.36 - 2.50 uIU/mL)  Routine Chem:  30-Jan-16 04:04   Result Comment TROPONIN - RESULTS VERIFIED BY REPEAT TESTING.  - ELEVATED TROPONIN PREVIOUSLY CALLED AT  - 2125 12/12/14.PMH  Result(s) reported on 13 Dec 2014 at 04:45AM.  Hemoglobin A1c [facility])  6.9 (The American Diabetes Association recommends that a primary goal of therapy should be <7% and that physicians should reevaluate the treatment regimen in patients with HbA1c values consistently >8%.)  Cardiac:  30-Jan-16 04:04   Troponin I  0.11 (0.00-0.05 0.05 ng/mL or less: NEGATIVE  Repeat testing in 3-6 hrs  if clinically indicated. >0.05 ng/mL: POTENTIAL  MYOCARDIAL INJURY. Repeat  testing in 3-6 hrs if  clinically indicated. NOTE: An increase or decrease  of 30% or more on serial  testing suggests a  clinically important change)  CPK-MB, Serum  7.1 (Result(s) reported on 13 Dec 2014 at 04:44AM.)  Routine Hem:  30-Jan-16 04:04   Hemoglobin (CBC) 12.4 (Result(s) reported on 13 Dec 2014 at  06:26AM.)   Assessment/Plan:  Assessment/Plan:  Assessment Acute lower GI bleeding. Likely diverticular bleeding. Stopped after embolization.   Plan Reg diet ordered.Watch for any rebleed. Will sign off. Call back if active bleeding recurs. thanks   Electronic Signatures: Lutricia Feilh, Tennile Styles (MD)  (Signed 30-Jan-16 07:43)  Authored: Chief Complaint, VITAL SIGNS/ANCILLARY NOTES, Brief Assessment, Lab Results, Assessment/Plan   Last Updated: 30-Jan-16 07:43 by Lutricia Feilh, Brodin Gelpi (MD)

## 2015-03-19 ENCOUNTER — Encounter: Payer: Self-pay | Admitting: Physical Therapy

## 2015-03-19 ENCOUNTER — Ambulatory Visit: Payer: Medicare (Managed Care) | Admitting: Physical Therapy

## 2015-03-19 DIAGNOSIS — R279 Unspecified lack of coordination: Secondary | ICD-10-CM | POA: Insufficient documentation

## 2015-03-19 DIAGNOSIS — R293 Abnormal posture: Secondary | ICD-10-CM | POA: Insufficient documentation

## 2015-03-19 DIAGNOSIS — R531 Weakness: Secondary | ICD-10-CM | POA: Diagnosis not present

## 2015-03-19 NOTE — Therapy (Signed)
[place] MAIN [facility] SERVICES [address] [place], [place], [address] Phone: [phone]   Fax:  [phone]  Physical Therapy Treatment  Patient Details  Name: Susan Shepherd MRN: [id number] Date of Birth: [birth year] Referring Provider:  Gareth Morgan, MD  Encounter Date: 03/19/2015      PT End of Session - 03/19/15 1422    Visit Number 29   Number of Visits 37   Date for PT Re-Evaluation 05/21/15   Authorization Type Supposed to get Medicare April 15, 2015   PT Start Time 1308   PT Stop Time 1400   PT Time Calculation (min) 52 min   Activity Tolerance Patient tolerated treatment well   Behavior During Therapy [facility] for tasks assessed/performed      Past Medical History  Diagnosis Date  . Neuromuscular disorder   . Neuromyelitis optica   . Diabetes mellitus without complication   . Hypertension   . Paraplegia   . Sjogren's syndrome     Past Surgical History  Procedure Laterality Date  . Eye surgery      There were no vitals filed for this visit.  Visit Diagnosis:  Weakness  Lack of coordination  Abnormal posture      Subjective Assessment - 03/19/15 1317    Subjective Patient reports having trouble with home aides from [facility] and difficulty with transportation; Patient reports that they havne't been there for 2-3 days and she was left in the bed in the morning; Patient and daughter have been following up with [facility];  Waiting on therapist from [facility] to observe sliding board transfer for home safety.    Currently in Pain? No/denies                 TREATMENT: Sitting edge of mat table (feet supported): -BUE bicep curls 3# x15; -BUE shoulder abduction 3# x15; -UE elbow extension 3# x15; -BUE chest press wand 3# x15; -BUE wand flexion 3# 2x10;   Also instructed patient in sliding board transfer wheelchair to mat table x1 rep each direction, supervision;  Patient required min-moderate verbal/tactile cues  for correct exercise technique including cues for hand placement and better positioning with transfer for better safety; Patient able to perform sliding board transfer at supervision level with cues well. She did require min Vcs for better posture during UE exercise for increased strengthening.                      PT Long Term Goals - 03/19/15 1506    PT LONG TERM GOAL #1   Title Patient will increase BUE gross strength to 5/5 to imrpove ability to push through BUE when repositioning for increased mobility at home. target 05/21/15   Status New   PT LONG TERM GOAL #2   Title Patient will be independent in home exercise program to improve strength/mobility for better functional independence with ADLs. Target 05/21/15   Status On-going   PT LONG TERM GOAL #3   Title Patient will increase functional reach to >10 inches with single UE in unsupported sitting to demonstrate an improvement in dynamic sitting balance for ADLs. target 05/21/15   Status Partially Met   PT LONG TERM GOAL #4   Title Patient will be independent in sliding board transfers for home ADLs. target 05/21/15   Status New               Plan - 03/19/15 1423    Clinical Impression Statement 64 yo  Female with Neuromyelitis Optica and other co-morbidities presents with no muscle activation in BLE from waist down. Patient reports having trouble with [facility] this week with her health aide not coming regularly etc. Patient repsonded well to cues with sliding board transfer. She would benefit from home therapist to assess safety within the home to be able to transfer safely on her own. Patient required close supervision during UE exercise for better trunk control/balance control. She would benefit from additional skilled PT intervention to improve UE strength, functional mobility and improve safety with ADLs. Patient did report that she did receive approval for Medicare starting April 15, 2015.   Pt will benefit from skilled  therapeutic intervention in order to improve on the following deficits Decreased range of motion;Impaired flexibility;Postural dysfunction;Impaired sensation;Decreased safety awareness;Decreased endurance;Decreased activity tolerance;Obesity;Impaired UE functional use;Decreased balance;Decreased strength   Rehab Potential Fair   Clinical Impairments Affecting Rehab Potential positive: motivation, [facility] support, negative: chronic progressive condition   PT Frequency 1x / week   PT Duration 12 weeks   PT Treatment/Interventions ADLs/Self Care Home Management;Therapeutic exercise;Therapeutic activities;Cryotherapy;Moist Heat;Gait training;Neuromuscular re-education;Aquatic Therapy;Electrical Stimulation;Manual techniques;Energy conservation;Balance training;Passive range of motion;Functional mobility training   PT Next Visit Plan have Numotion check locks; work on UE strengthening and transfer ability        Problem List Patient Active Problem List   Diagnosis Date Noted  . Diverticulosis of colon with hemorrhage   . Decubitus ulcer, stage I   . Pressure sore   . Chronic diastolic CHF (congestive heart failure)   . Pulmonary hypertension   . LVH (left ventricular hypertrophy)   . Sjogren's disease   . Chronic paraplegia   . Acute GI bleeding 01/08/2015  . Acute blood loss anemia 01/08/2015  . Neuromyelitis optica 01/08/2015  . Essential hypertension 01/08/2015  . Diabetes mellitus type 2, controlled 01/08/2015    Alessandra Grout, PT, DPT, [phone] 03/20/2015 8:40 AM    [place] [facility] MAIN [facility] SERVICES [address] [place], [place], [address] Phone: [phone]   Fax:  [phone]

## 2015-03-26 ENCOUNTER — Ambulatory Visit: Payer: Medicare (Managed Care) | Attending: Family Medicine | Admitting: Physical Therapy

## 2015-03-26 DIAGNOSIS — R279 Unspecified lack of coordination: Secondary | ICD-10-CM

## 2015-03-26 DIAGNOSIS — R531 Weakness: Secondary | ICD-10-CM

## 2015-03-26 DIAGNOSIS — R293 Abnormal posture: Secondary | ICD-10-CM

## 2015-03-26 NOTE — Therapy (Signed)
[place] MAIN [facility] SERVICES [address] [place], [place], [address] Phone: [phone]   Fax:  [phone]  Physical Therapy Treatment  Patient Details  Name: Susan Shepherd MRN: [id number] Date of Birth: January 29, 1951 Referring Provider:  Gareth Morgan, MD  Encounter Date: 03/26/2015      PT End of Session - 03/26/15 1413    Visit Number 30   Number of Visits 37   Date for PT Re-Evaluation 05/21/15   Authorization Type Supposed to get Medicare April 15, 2015   PT Start Time 1302   PT Stop Time 1400   PT Time Calculation (min) 58 min   Equipment Utilized During Treatment Gait belt   Activity Tolerance Patient tolerated treatment well;Patient limited by fatigue   Behavior During Therapy [facility] for tasks assessed/performed      Past Medical History  Diagnosis Date  . Neuromuscular disorder   . Neuromyelitis optica   . Diabetes mellitus without complication   . Hypertension   . Paraplegia   . Sjogren's syndrome     Past Surgical History  Procedure Laterality Date  . Eye surgery      There were no vitals filed for this visit.  Visit Diagnosis:  Weakness  Lack of coordination  Abnormal posture      Subjective Assessment - 03/26/15 1319    Subjective Patient reports that she is doing better. She is tired today but slept well last night. Patient reports that her home health aides have been coming more regularly; She reports that she hasn't been able to work on sliding board transfers at home because her PACE therapists haven't been able to come over to supervise.   Patient Stated Goals to be able to transfer independently and get back to PLOF.   Currently in Pain? No/denies                   TREATMENT: Sitting edge of mat table (feet supported): -BUE overhead ball press yellow weighted x10; -BUE alphabet with yellow weighted ball A-Z with mod A for sitting balance  Sitting in chair: -red tband BUE lat  pull down 2x10; -BUE shoulder horizontal abduction red tband x10; -HOIST tricep press, plate #2, 0F00; -HOIST bicep curl, plate #1, F12;  Also instructed patient in sliding board transfer wheelchair to mat table x1 rep each direction, supervision-min A with mod Vcs for positioning to improve forward position of sliding board for better clearance of wheel to improve transfer ability.  Patient required min-moderate verbal/tactile cues for correct exercise technique including cues for hand placement and better positioning with transfer for better safety;  She did require min Vcs for better posture during UE exercise for increased strengthening.                             PT Education - 03/26/15 1411    Education provided Yes   Education Details educated patient on safe UE exercises to perform to work on tricep and shoulder strengthening; instructed patient in correct exercise technique.   Person(s) Educated Patient   Methods Explanation;Demonstration   Comprehension Verbalized understanding;Verbal cues required             PT Long Term Goals - 03/19/15 1506    PT LONG TERM GOAL #1   Title Patient will increase BUE gross strength to 5/5 to imrpove ability to push through BUE when repositioning for increased mobility at home. target 05/21/15  Status New   PT LONG TERM GOAL #2   Title Patient will be independent in home exercise program to improve strength/mobility for better functional independence with ADLs. Target 05/21/15   Status On-going   PT LONG TERM GOAL #3   Title Patient will increase functional reach to >10 inches with single UE in unsupported sitting to demonstrate an improvement in dynamic sitting balance for ADLs. target 05/21/15   Status Partially Met   PT LONG TERM GOAL #4   Title Patient will be independent in sliding board transfers for home ADLs. target 05/21/15   Status New               Plan - 03/26/15 1413    Clinical Impression  Statement Instructed patient in advanced BUE strengthening, progressing to resisted weight machines; Patient was instructed in sliding board transfers requiring increased cues for better positioning to improve ability to transfer from level surface. She was very tired during today's session and was limited by fatigue requiring increased cues for better positioning during UE strengthening to improve muscle activation.   Pt will benefit from skilled therapeutic intervention in order to improve on the following deficits Decreased range of motion;Impaired flexibility;Postural dysfunction;Impaired sensation;Decreased safety awareness;Decreased endurance;Decreased activity tolerance;Obesity;Impaired UE functional use;Decreased balance;Decreased strength   Rehab Potential Fair   Clinical Impairments Affecting Rehab Potential positive: motivation, PACE support, negative: chronic progressive condition   PT Frequency 1x / week   PT Duration 12 weeks   PT Treatment/Interventions ADLs/Self Care Home Management;Therapeutic exercise;Therapeutic activities;Cryotherapy;Moist Heat;Gait training;Neuromuscular re-education;Aquatic Therapy;Electrical Stimulation;Manual techniques;Energy conservation;Balance training;Passive range of motion;Functional mobility training   PT Next Visit Plan continue with UE resisted exercise with machines, work on transfers        Problem List Patient Active Problem List   Diagnosis Date Noted  . Diverticulosis of colon with hemorrhage   . Decubitus ulcer, stage I   . Pressure sore   . Chronic diastolic CHF (congestive heart failure)   . Pulmonary hypertension   . LVH (left ventricular hypertrophy)   . Sjogren's disease   . Chronic paraplegia   . Acute GI bleeding 01/08/2015  . Acute blood loss anemia 01/08/2015  . Neuromyelitis optica 01/08/2015  . Essential hypertension 01/08/2015  . Diabetes mellitus type 2, controlled 01/08/2015    Alessandra Grout, PT, DPT,  [phone] 03/26/2015 2:18 PM   [place] MAIN [facility] SERVICES [address] [place], [place], [address] Phone: [phone]   Fax:  [phone]

## 2015-04-02 ENCOUNTER — Ambulatory Visit: Payer: Medicare (Managed Care) | Admitting: Physical Therapy

## 2015-04-02 ENCOUNTER — Encounter: Payer: Self-pay | Admitting: Physical Therapy

## 2015-04-02 DIAGNOSIS — R531 Weakness: Secondary | ICD-10-CM

## 2015-04-02 DIAGNOSIS — R293 Abnormal posture: Secondary | ICD-10-CM

## 2015-04-02 DIAGNOSIS — R279 Unspecified lack of coordination: Secondary | ICD-10-CM

## 2015-04-02 NOTE — Therapy (Signed)
[place] MAIN [facility] SERVICES [address] [place], [place], [address] Phone: [phone]   Fax:  [phone]  Physical Therapy Treatment  Patient Details  Name: Susan Shepherd MRN: [id number] Date of Birth: [birth year] Referring Provider:  Gareth Morgan, MD  Encounter Date: 04/02/2015      PT End of Session - 04/02/15 1402    Visit Number 31   Number of Visits 37   Date for PT Re-Evaluation 05/21/15   Authorization Type Supposed to get Medicare April 15, 2015   PT Start Time 1301   PT Stop Time 1358   PT Time Calculation (min) 57 min   Equipment Utilized During Treatment Gait belt   Activity Tolerance Patient tolerated treatment well;Patient limited by fatigue   Behavior During Therapy [facility] for tasks assessed/performed      Past Medical History  Diagnosis Date  . Neuromuscular disorder   . Neuromyelitis optica   . Diabetes mellitus without complication   . Hypertension   . Paraplegia   . Sjogren's syndrome     Past Surgical History  Procedure Laterality Date  . Eye surgery      There were no vitals filed for this visit.  Visit Diagnosis:  Weakness  Lack of coordination  Abnormal posture      Subjective Assessment - 04/02/15 1327    Subjective Reports still doing well; did try a car transfer on her own to go to a play and did well. She denies any new falls; reports no new pain;    Patient Stated Goals to be able to transfer independently and get back to PLOF.               TREATMENT:  Sitting in chair: -red tband BUE lat pull down 2x15; -yellow weighted ball throw 2x15; -yellow weighted ball reach in diagonals 2x10 each direction; -BUE press ups on chair 2x15 -BUE red tband chest press 2x15;  -HOIST tricep press, plate #2, 6P95; -HOIST bicep curl, plate #1, 0D32;  Also instructed patient in sliding board transfer wheelchair to mat table x1 rep each direction, supervision-min A with mod Vcs for  positioning to improve forward position of sliding board for better clearance of wheel to improve transfer ability.  Patient required min-moderate verbal/tactile cues for correct exercise technique including cues for hand placement and better positioning with transfer for better safety;  She did require min Vcs for better posture during UE exercise for increased strengthening. Patient reports increased fatigue with advanced UE resisted exercise however is able to demonstrate better UE motor control during exercise.                            PT Education - 04/02/15 1401    Education provided Yes   Education Details Educated patient in correct exercises for UE strengthening and ways to improve sliding board transfer;   Person(s) Educated Patient   Methods Explanation;Demonstration;Verbal cues;Tactile cues   Comprehension Verbalized understanding;Returned demonstration;Verbal cues required;Tactile cues required             PT Long Term Goals - 03/19/15 1506    PT LONG TERM GOAL #1   Title Patient will increase BUE gross strength to 5/5 to imrpove ability to push through BUE when repositioning for increased mobility at home. target 05/21/15   Status New   PT LONG TERM GOAL #2   Title Patient will be independent in home exercise program to improve  strength/mobility for better functional independence with ADLs. Target 05/21/15   Status On-going   PT LONG TERM GOAL #3   Title Patient will increase functional reach to >10 inches with single UE in unsupported sitting to demonstrate an improvement in dynamic sitting balance for ADLs. target 05/21/15   Status Partially Met   PT LONG TERM GOAL #4   Title Patient will be independent in sliding board transfers for home ADLs. target 05/21/15   Status New               Plan - 04/02/15 1403    Clinical Impression Statement Focused on UE strengthening working on resisted weight machines and resisted band exercises. Patient  responded fairly to cues. She demonstrates increased right trunk lean which was corrected with reaching towards left side. Patient able to propel self in hemiheight wheelchair better, but continues to demonstrate posterior lean when sitting in chair. She reports increased fatigue following session.   Pt will benefit from skilled therapeutic intervention in order to improve on the following deficits Decreased range of motion;Impaired flexibility;Postural dysfunction;Impaired sensation;Decreased safety awareness;Decreased endurance;Decreased activity tolerance;Obesity;Impaired UE functional use;Decreased balance;Decreased strength   Rehab Potential Fair   Clinical Impairments Affecting Rehab Potential positive: motivation, PACE support, negative: chronic progressive condition   PT Frequency 1x / week   PT Duration 12 weeks   PT Treatment/Interventions ADLs/Self Care Home Management;Therapeutic exercise;Therapeutic activities;Cryotherapy;Moist Heat;Gait training;Neuromuscular re-education;Aquatic Therapy;Electrical Stimulation;Manual techniques;Energy conservation;Balance training;Passive range of motion;Functional mobility training   PT Next Visit Plan continue with UE resisted exercise with machines, work on transfers        Problem List Patient Active Problem List   Diagnosis Date Noted  . Diverticulosis of colon with hemorrhage   . Decubitus ulcer, stage I   . Pressure sore   . Chronic diastolic CHF (congestive heart failure)   . Pulmonary hypertension   . LVH (left ventricular hypertrophy)   . Sjogren's disease   . Chronic paraplegia   . Acute GI bleeding 01/08/2015  . Acute blood loss anemia 01/08/2015  . Neuromyelitis optica 01/08/2015  . Essential hypertension 01/08/2015  . Diabetes mellitus type 2, controlled 01/08/2015    Alessandra Grout, PT, DPT, [phone] 04/02/2015 2:14 PM   [place] MAIN [facility] SERVICES [address]  [place], [place], [address] Phone: [phone]   Fax:  [phone]

## 2015-04-09 ENCOUNTER — Ambulatory Visit: Payer: Medicare (Managed Care) | Admitting: Physical Therapy

## 2015-04-09 ENCOUNTER — Encounter: Payer: Self-pay | Admitting: Physical Therapy

## 2015-04-09 DIAGNOSIS — R531 Weakness: Secondary | ICD-10-CM | POA: Diagnosis not present

## 2015-04-09 DIAGNOSIS — M6281 Muscle weakness (generalized): Secondary | ICD-10-CM

## 2015-04-09 DIAGNOSIS — G360 Neuromyelitis optica [Devic]: Secondary | ICD-10-CM | POA: Insufficient documentation

## 2015-04-09 DIAGNOSIS — R279 Unspecified lack of coordination: Secondary | ICD-10-CM | POA: Insufficient documentation

## 2015-04-09 DIAGNOSIS — R293 Abnormal posture: Secondary | ICD-10-CM

## 2015-04-09 NOTE — Therapy (Signed)
[place] MAIN [facility] SERVICES [address] [place], [place], [address] Phone: [phone]   Fax:  [phone]  Physical Therapy Treatment  Patient Details  Name: Susan Shepherd MRN: [id number] Date of Birth: 1950-12-13 Referring Provider:  Gareth Morgan, MD  Encounter Date: 04/09/2015      PT End of Session - 04/09/15 1410    Visit Number 32   Number of Visits 37   Date for PT Re-Evaluation 05/21/15   Authorization Type Supposed to get Medicare April 15, 2015   PT Start Time 1301   PT Stop Time 1357   PT Time Calculation (min) 56 min   Activity Tolerance Patient tolerated treatment well;Patient limited by fatigue   Behavior During Therapy [facility] for tasks assessed/performed      Past Medical History  Diagnosis Date  . Neuromuscular disorder   . Neuromyelitis optica   . Diabetes mellitus without complication   . Hypertension   . Paraplegia   . Sjogren's syndrome     Past Surgical History  Procedure Laterality Date  . Eye surgery      There were no vitals filed for this visit.  Visit Diagnosis:  Weakness  Lack of coordination  Abnormal posture      Subjective Assessment - 04/09/15 1401    Subjective Patient reports that she was signed off on doing home slide board transfers and presents to therapy without hoyer lift sling; She reports frustration over therapist disagreement regarding treatment plan. "I feel that kat/brad are not on the same page with me using my quickie chair/regular manual chair and also I am a little frustrated because we aren't doing as much strengthening as I would like."    Patient Stated Goals to be able to transfer independently and get back to PLOF.   Currently in Pain? No/denies        TREATMENT: Bicep curls, plate #1, [id number]; Tricep press, plate #2, V95, plate #3, G38;   Patient required min Vcs for correct exercise technique including cues for positioning for optimum  strengthening.  Patient supine: -assess foot/toe ROM, patient able to initiate toe DF/PF and ankle DF/PF on LLE, however minimal foot activation noted on RLE; Instructed patient in AAROM ankle DF/PF x5 reps on LLE and PROM on RLE; PT performed PROM of hip/knee flexion/extension x10 reps bilaterally with cues to increase muscle activation; Patient unable to initiate quad set;  Following exercise: Patient performed sliding board transfer, modified independently, taking longer to complete task, but demonstrating good board placement and safety x1 rep;  Patient also transitioned sit<>supine, min A with cues for positioning and scooting to improve transition ability.  Patient required min-moderate verbal/tactile cues for correct exercise technique.                          PT Education - 04/09/15 1405    Education provided Yes   Education Details transfers, UE/LE strengthening   Person(s) Educated Patient   Methods Explanation;Demonstration;Verbal cues   Comprehension Verbalized understanding;Returned demonstration;Verbal cues required;Tactile cues required             PT Long Term Goals - 04/09/15 1414    PT LONG TERM GOAL #1   Title Patient will increase BUE gross strength to 5/5 to imrpove ability to push through BUE when repositioning for increased mobility at home. target 05/21/15   Status Partially Met   PT LONG TERM GOAL #2   Title Patient will be  independent in home exercise program to improve strength/mobility for better functional independence with ADLs. Target 05/21/15   Status On-going   PT LONG TERM GOAL #3   Title Patient will increase functional reach to >10 inches with single UE in unsupported sitting to demonstrate an improvement in dynamic sitting balance for ADLs. target 05/21/15   Status Partially Met   PT LONG TERM GOAL #4   Title Patient will be independent in sliding board transfers for home ADLs. target 05/21/15   Status Achieved   PT LONG  TERM GOAL #5   Title Patient will demonstrate increased LE strength as evidenced by initiating AROM of BLE through partial range to assist with LE movement for bed mobility/transfers by 05/21/15   Status New               Plan - 04/09/15 1411    Clinical Impression Statement Advanced UE strengthening with increased resistance. PT re-educated patient in plan of care. She was able to demonstrate increased LLE toe/foot movement and initate LE muscle contraction. She continues to have significant weakness in BLE without hip/knee muscle activation but is able to tolerate PROM; Patient would benefit from additional skilled PT Intervention to improve LE strength/UE strength and functional mobility.   Pt will benefit from skilled therapeutic intervention in order to improve on the following deficits Decreased range of motion;Impaired flexibility;Postural dysfunction;Impaired sensation;Decreased safety awareness;Decreased endurance;Decreased activity tolerance;Obesity;Impaired UE functional use;Decreased balance;Decreased strength   Rehab Potential Fair   Clinical Impairments Affecting Rehab Potential positive: motivation, PACE support, negative: chronic progressive condition   PT Frequency 1x / week   PT Duration 12 weeks   PT Treatment/Interventions ADLs/Self Care Home Management;Therapeutic exercise;Therapeutic activities;Cryotherapy;Moist Heat;Gait training;Neuromuscular re-education;Aquatic Therapy;Electrical Stimulation;Manual techniques;Energy conservation;Balance training;Passive range of motion;Functional mobility training   PT Next Visit Plan continue with UE resisted exercise with machines, work on LE strengthening        Problem List Patient Active Problem List   Diagnosis Date Noted  . Diverticulosis of colon with hemorrhage   . Decubitus ulcer, stage I   . Pressure sore   . Chronic diastolic CHF (congestive heart failure)   . Pulmonary hypertension   . LVH (left ventricular  hypertrophy)   . Sjogren's disease   . Chronic paraplegia   . Acute GI bleeding 01/08/2015  . Acute blood loss anemia 01/08/2015  . Neuromyelitis optica 01/08/2015  . Essential hypertension 01/08/2015  . Diabetes mellitus type 2, controlled 01/08/2015    Hopkins,Margaret, PT, DPT 04/09/2015, 2:17 PM  [place] MAIN [facility] SERVICES [address] [place], [place], [address] Phone: [phone]   Fax:  [phone]

## 2015-04-16 ENCOUNTER — Emergency Department
Admission: EM | Admit: 2015-04-16 | Discharge: 2015-04-17 | Payer: Medicare (Managed Care) | Attending: Emergency Medicine | Admitting: Emergency Medicine

## 2015-04-16 ENCOUNTER — Emergency Department: Payer: Medicare (Managed Care)

## 2015-04-16 ENCOUNTER — Encounter: Payer: Self-pay | Admitting: Emergency Medicine

## 2015-04-16 ENCOUNTER — Ambulatory Visit: Payer: Medicare (Managed Care) | Admitting: Physical Therapy

## 2015-04-16 DIAGNOSIS — G8220 Paraplegia, unspecified: Secondary | ICD-10-CM | POA: Diagnosis not present

## 2015-04-16 DIAGNOSIS — E119 Type 2 diabetes mellitus without complications: Secondary | ICD-10-CM | POA: Insufficient documentation

## 2015-04-16 DIAGNOSIS — Z7952 Long term (current) use of systemic steroids: Secondary | ICD-10-CM | POA: Insufficient documentation

## 2015-04-16 DIAGNOSIS — I10 Essential (primary) hypertension: Secondary | ICD-10-CM | POA: Diagnosis not present

## 2015-04-16 DIAGNOSIS — G360 Neuromyelitis optica [Devic]: Secondary | ICD-10-CM | POA: Diagnosis not present

## 2015-04-16 DIAGNOSIS — I2699 Other pulmonary embolism without acute cor pulmonale: Secondary | ICD-10-CM | POA: Insufficient documentation

## 2015-04-16 DIAGNOSIS — Z79899 Other long term (current) drug therapy: Secondary | ICD-10-CM | POA: Diagnosis not present

## 2015-04-16 DIAGNOSIS — R0600 Dyspnea, unspecified: Secondary | ICD-10-CM | POA: Diagnosis present

## 2015-04-16 LAB — COMPREHENSIVE METABOLIC PANEL
ALT: 35 U/L (ref 14–54)
ANION GAP: 13 (ref 5–15)
AST: 71 U/L — ABNORMAL HIGH (ref 15–41)
Albumin: 3.3 g/dL — ABNORMAL LOW (ref 3.5–5.0)
Alkaline Phosphatase: 62 U/L (ref 38–126)
BILIRUBIN TOTAL: 0.8 mg/dL (ref 0.3–1.2)
BUN: 26 mg/dL — AB (ref 6–20)
CALCIUM: 9.1 mg/dL (ref 8.9–10.3)
CHLORIDE: 104 mmol/L (ref 101–111)
CO2: 21 mmol/L — ABNORMAL LOW (ref 22–32)
Creatinine, Ser: 1.62 mg/dL — ABNORMAL HIGH (ref 0.44–1.00)
GFR calc non Af Amer: 33 mL/min — ABNORMAL LOW (ref >60)
GFR, EST AFRICAN AMERICAN: 38 mL/min — AB (ref >60)
Glucose, Bld: 153 mg/dL — ABNORMAL HIGH (ref 65–99)
Potassium: 5.5 mmol/L — ABNORMAL HIGH (ref 3.5–5.1)
Sodium: 138 mmol/L (ref 135–145)
TOTAL PROTEIN: 6.7 g/dL (ref 6.5–8.1)

## 2015-04-16 LAB — CBC WITH DIFFERENTIAL/PLATELET
Basophils Absolute: 0.1 10*3/uL (ref 0–0.1)
Eosinophils Absolute: 0 10*3/uL (ref 0–0.7)
HCT: 34.9 % — ABNORMAL LOW (ref 35.0–47.0)
HEMOGLOBIN: 10.6 g/dL — AB (ref 12.0–16.0)
Lymphocytes Relative: 18 %
Lymphs Abs: 1.4 10*3/uL (ref 1.0–3.6)
MCH: 24.7 pg — ABNORMAL LOW (ref 26.0–34.0)
MCHC: 30.2 g/dL — ABNORMAL LOW (ref 32.0–36.0)
MCV: 81.6 fL (ref 80.0–100.0)
MONO ABS: 1 10*3/uL — AB (ref 0.2–0.9)
Monocytes Relative: 12 %
Neutro Abs: 5.7 10*3/uL (ref 1.4–6.5)
Neutrophils Relative %: 69 %
Platelets: 226 10*3/uL (ref 150–440)
RBC: 4.28 MIL/uL (ref 3.80–5.20)
RDW: 19.2 % — ABNORMAL HIGH (ref 11.5–14.5)
WBC: 8.2 10*3/uL (ref 3.6–11.0)

## 2015-04-16 LAB — TROPONIN I: Troponin I: 0.12 ng/mL — ABNORMAL HIGH (ref <0.031)

## 2015-04-16 MED ORDER — IOHEXOL 350 MG/ML SOLN
100.0000 mL | Freq: Once | INTRAVENOUS | Status: AC | PRN
  Administered 2015-04-16: 80 mL via INTRAVENOUS

## 2015-04-16 MED ORDER — ASPIRIN 81 MG PO CHEW
324.0000 mg | CHEWABLE_TABLET | Freq: Once | ORAL | Status: AC
  Administered 2015-04-16: 324 mg via ORAL

## 2015-04-16 MED ORDER — SODIUM CHLORIDE 0.9 % IV BOLUS (SEPSIS)
1000.0000 mL | Freq: Once | INTRAVENOUS | Status: AC
  Administered 2015-04-16: 1000 mL via INTRAVENOUS

## 2015-04-16 MED ORDER — ASPIRIN 81 MG PO CHEW
CHEWABLE_TABLET | ORAL | Status: AC
  Administered 2015-04-16: 324 mg via ORAL
  Filled 2015-04-16: qty 4

## 2015-04-16 NOTE — ED Notes (Signed)
Patient transported to CT 

## 2015-04-16 NOTE — ED Provider Notes (Addendum)
[facility] Emergency Department Provider Note  ____________________________________________  Time seen: Upon arrival to the emergency department  I have reviewed the triage vital signs and the nursing notes.   HISTORY  Chief Complaint Respiratory Distress    HPI Susan Shepherd is a 64 y.o. female with a history of neuromyelitis optica and paraplegia presents with weakness and decreased appetite for 3 days and worsening shortness of breath since this afternoon. She denies any pain. No cough, fever. No burning with urination. Says that her blood pressure is labile and go anywhere from the 70s to the 150s over the course of several minutes. Does not wear any home oxygen. No history of blood clots.   Past Medical History  Diagnosis Date  . Neuromuscular disorder   . Neuromyelitis optica   . Diabetes mellitus without complication   . Hypertension   . Paraplegia   . Sjogren's syndrome     Patient Active Problem List   Diagnosis Date Noted  . Diverticulosis of colon with hemorrhage   . Decubitus ulcer, stage I   . Pressure sore   . Chronic diastolic CHF (congestive heart failure)   . Pulmonary hypertension   . LVH (left ventricular hypertrophy)   . Sjogren's disease   . Chronic paraplegia   . Acute GI bleeding 01/08/2015  . Acute blood loss anemia 01/08/2015  . Neuromyelitis optica 01/08/2015  . Essential hypertension 01/08/2015  . Diabetes mellitus type 2, controlled 01/08/2015    Past Surgical History  Procedure Laterality Date  . Eye surgery      Current Outpatient Rx  Name  Route  Sig  Dispense  Refill  . alendronate (FOSAMAX) 70 MG tablet   Oral   Take 70 mg by mouth once a week. Saturday. Take with a full glass of water on an empty stomach.         Marland Kitchen azaTHIOprine (IMURAN) 50 MG tablet   Oral   Take 50 mg by mouth daily.         . baclofen (LIORESAL) 10 MG tablet   Oral   Take 10 mg by mouth 2 (two) times daily. Take  at  9am,  at 12p, and 4omg at 9pm         . calcium carbonate (OS-CAL - DOSED IN MG OF ELEMENTAL CALCIUM) 1250 (500 CA) MG tablet   Oral   Take 1 tablet by mouth daily with breakfast.         . carbonyl iron (FEOSOL) 45 MG TABS tablet   Oral   Take 45 mg by mouth daily.         . carvedilol (COREG) 3.125 MG tablet   Oral   Take 3.125 mg by mouth 2 (two) times daily with a meal.         . cholecalciferol (VITAMIN D) 1000 UNITS tablet   Oral   Take 1,000 Units by mouth daily.         Marland Kitchen EPINEPHrine 0.3 mg/0.3 mL IJ SOAJ injection   Intramuscular   Inject 0.3 mg into the muscle once.         . gabapentin (NEURONTIN) 300 MG capsule   Oral   Take 600 mg by mouth at bedtime.         . gabapentin (NEURONTIN) 400 MG capsule   Oral   Take 400 mg by mouth 2 (two) times daily. At 9:am and noon         . metFORMIN (GLUCOPHAGE) 500 MG  tablet   Oral   Take 500 mg by mouth 2 (two) times daily with a meal.         . mirabegron ER (MYRBETRIQ) 25 MG TB24 tablet   Oral   Take 25 mg by mouth at bedtime.         Marland Kitchen omeprazole (PRILOSEC) 40 MG capsule   Oral   Take 40 mg by mouth daily.         . predniSONE (DELTASONE) 10 MG tablet   Oral   Take 30 mg by mouth daily with breakfast.          . solifenacin (VESICARE) 10 MG tablet   Oral   Take 10 mg by mouth daily.         Marland Kitchen tiZANidine (ZANAFLEX) 2 MG tablet   Oral   Take 4-6 mg by mouth once. Take  at 8, and noon, then  at 6:pm         . vitamin C (ASCORBIC ACID) 500 MG tablet   Oral   Take 500 mg by mouth 2 (two) times daily.           Allergies Bee venom  Family History  Problem Relation Age of Onset  . Adopted: Yes    Social History History  Substance Use Topics  . Smoking status: Never Smoker   . Smokeless tobacco: Not on file  . Alcohol Use: No    Review of Systems Constitutional: No fever/chills Eyes: No visual changes. ENT: No sore throat. Cardiovascular: Denies chest  pain. Respiratory: As above Gastrointestinal: No abdominal pain.  No nausea, no vomiting.  No diarrhea.  No constipation. Genitourinary: Negative for dysuria. Musculoskeletal: Negative for back pain. Skin: Negative for rash. Neurological: Negative for headaches, chronic lower extremity paralysis   10-point ROS otherwise negative.  ____________________________________________   PHYSICAL EXAM:  VITAL SIGNS: ED Triage Vitals  Enc Vitals Group     BP 04/16/15 2125 81/62 mmHg     Pulse Rate 04/16/15 2125 103     Resp 04/16/15 2125 28     Temp 04/16/15 2125 98.3 F (36.8 C)     Temp src --      SpO2 04/16/15 2117 95 %     Weight 04/16/15 2125 212 lb 8.4 oz (96.4 kg)     Height 04/16/15 2125  (1.6 m)     Head Cir --      Peak Flow --      Pain Score --      Pain Loc --      Pain Edu? --      Excl. in GC? --     Constitutional: Alert and oriented. Well appearing and in no acute distress. Eyes: Conjunctivae are normal. PERRL. EOMI. Head: Atraumatic. Nose: No congestion/rhinnorhea. Mouth/Throat: Mucous membranes are moist.  Oropharynx non-erythematous. Neck: No stridor.   Cardiovascular: Tachycardic, regular rhythm. Grossly normal heart sounds.  Good peripheral circulation. Respiratory: Normal respiratory effort.  No retractions. Mild rales to the bilateral bases.  Gastrointestinal: Soft and nontender. No distention. No abdominal bruits. No CVA tenderness. Musculoskeletal: No lower extremity tenderness.  No joint effusions. Bilateral lower extremity moderate edema. Neurologic:  Normal speech and language. Bilateral lower summary paralysis which is chronic.  Skin:  Skin is warm, dry and intact. No rash noted. Psychiatric: Mood and affect are normal. Speech and behavior are normal.  ____________________________________________   LABS (all labs ordered are listed, but only abnormal results are displayed)  Labs Reviewed  CBC WITH DIFFERENTIAL/PLATELET - Abnormal; Notable  for the following:    Hemoglobin 10.6 (*)    HCT 34.9 (*)    MCH 24.7 (*)    MCHC 30.2 (*)    RDW 19.2 (*)    Monocytes Absolute 1.0 (*)    All other components within normal limits  COMPREHENSIVE METABOLIC PANEL - Abnormal; Notable for the following:    Potassium 5.5 (*)    CO2 21 (*)    Glucose, Bld 153 (*)    BUN 26 (*)    Creatinine, Ser 1.62 (*)    Albumin 3.3 (*)    AST 71 (*)    GFR calc non Af Amer 33 (*)    GFR calc Af Amer 38 (*)    All other components within normal limits  TROPONIN I - Abnormal; Notable for the following:    Troponin I 0.12 (*)    All other components within normal limits  URINALYSIS COMPLETEWITH MICROSCOPIC ([facility] ONLY)  APTT  PROTIME-INR   ____________________________________________  EKG  ED ECG REPORT I, Arelia Longest, the attending physician, personally viewed and interpreted this ECG.   Date: 04/16/2015  EKG Time: 2123  Rate: 103  Rhythm: sinus tachycardia  Axis: Normal axis  Intervals:none  ST&T Change: T wave inversions in V2 through 4. No ST elevation or depressions. No STEMI.  ____________________________________________  RADIOLOGY  IMPRESSION: Positive for acute PE with CT evidence of right heart strain (RV/LV Ratio = 2.4) consistent with at least submassive (intermediate risk) PE. The presence of right heart strain has been associated with an increased risk of morbidity and mortality. Please activate Code PE by paging [phone].  Critical Value/emergent results were called by telephone at the time of interpretation on 04/16/2015 at 11:34 pm to Dr. Gladstone Pih , who verbally acknowledged these results.   Electronically Signed  By: Rubye Oaks M.D.  On: 04/16/2015 23:40 ____________________________________________   PROCEDURES  CRITICAL CARE Performed by: Arelia Longest   Total critical care time: 40 minutes  Critical care time was exclusive of separately billable procedures and  treating other patients.  Critical care was necessary to treat or prevent imminent or life-threatening deterioration.  Critical care was time spent personally by me on the following activities: development of treatment plan with patient and/or surrogate as well as nursing, discussions with consultants, evaluation of patient's response to treatment, examination of patient, obtaining history from patient or surrogate, ordering and performing treatments and interventions, ordering and review of laboratory studies, ordering and review of radiographic studies, pulse oximetry and re-evaluation of patient's condition.   ____________________________________________   INITIAL IMPRESSION / ASSESSMENT AND PLAN / ED COURSE  Pertinent labs & imaging results that were available during my care of the patient were reviewed by me and considered in my medical decision making (see chart for details).  ----------------------------------------- 12:01 AM on 04/17/2015 -----------------------------------------  Patient with labile blood pressure number from 100 down to the 70s. At this point is in the 80s to 90s. Able to discuss case with Dr. Vaughan Basta critical care at [facility] at 11:47 PM. Thinks patient may be a candidate for catheter directed TPA. Also discussed with Dr. Loreta Ave of interventional radiology 11:50 PM also thinks patient may be a candidate for catheter directed TPA. Due to GI bleed this past February we are currently holding on TPA infusion secondary to risk of the bleeding diathesis. This was discussed with both the critical care doctor as well as interventional radiologist.  The patient is awaiting her heparin from the pharmacy. Accepted to [facility] Ign[facility]acub [facility] to the intensive care unit. [facility] aware and will be transported emergency traffic to [facility] in [place].  Patient also aware of pulmonary emboli and need for transfer. I emphasized the severity of her medical  condition and she understands. I discussed with her the need for blood thinning medications.  I also discussed the diagnosis and treatment plan with the patient's daughter, Alvis LemmingsDawn, who will meet the patient at [facility]. ____________________________________________   FINAL CLINICAL IMPRESSION(S) / ED DIAGNOSES  Acute submassive pulmonary emboli.  Initial visit.    Myrna Blazeravid Matthew Rakeya Glab, MD 04/17/15 0006  Blood pressure on departure 96/64.  Patient well appearing despite critical illness. Son, Clide CliffRicky, at the bedside. Critical nature of illness and high mortality of case to him.  Myrna Blazeravid Matthew Laiylah Roettger, MD 04/17/15 [phone]

## 2015-04-16 NOTE — ED Notes (Signed)
Patient presents to Emergency Department via EMS with complaints of breathing difficulty.  Pt reports 2 days of weakness and trouble breathing. 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

## 2015-04-16 NOTE — ED Notes (Signed)
Family member states pt "is gasping for breath" nad noted att

## 2015-04-16 NOTE — ED Notes (Signed)
TROPONIN 0.12, susan, DR schaevitz notifed

## 2015-04-16 NOTE — ED Notes (Signed)
Lab called for add on APPT

## 2015-04-17 ENCOUNTER — Inpatient Hospital Stay (HOSPITAL_COMMUNITY): Payer: Medicare (Managed Care)

## 2015-04-17 ENCOUNTER — Inpatient Hospital Stay (HOSPITAL_COMMUNITY)
Admission: AD | Admit: 2015-04-17 | Discharge: 2015-04-22 | DRG: 175 | Disposition: A | Discharge status: Home Health | Payer: Medicare (Managed Care) | Source: Other Acute Inpatient Hospital | Attending: Internal Medicine | Admitting: Internal Medicine

## 2015-04-17 ENCOUNTER — Encounter (HOSPITAL_COMMUNITY): Payer: Self-pay | Admitting: Emergency Medicine

## 2015-04-17 DIAGNOSIS — D649 Anemia, unspecified: Secondary | ICD-10-CM | POA: Diagnosis present

## 2015-04-17 DIAGNOSIS — G360 Neuromyelitis optica [Devic]: Secondary | ICD-10-CM | POA: Diagnosis present

## 2015-04-17 DIAGNOSIS — N319 Neuromuscular dysfunction of bladder, unspecified: Secondary | ICD-10-CM | POA: Diagnosis present

## 2015-04-17 DIAGNOSIS — I272 Other secondary pulmonary hypertension: Secondary | ICD-10-CM | POA: Diagnosis present

## 2015-04-17 DIAGNOSIS — Z66 Do not resuscitate: Secondary | ICD-10-CM | POA: Diagnosis present

## 2015-04-17 DIAGNOSIS — E875 Hyperkalemia: Secondary | ICD-10-CM | POA: Diagnosis present

## 2015-04-17 DIAGNOSIS — R339 Retention of urine, unspecified: Secondary | ICD-10-CM | POA: Diagnosis not present

## 2015-04-17 DIAGNOSIS — I10 Essential (primary) hypertension: Secondary | ICD-10-CM | POA: Diagnosis present

## 2015-04-17 DIAGNOSIS — Z86711 Personal history of pulmonary embolism: Secondary | ICD-10-CM

## 2015-04-17 DIAGNOSIS — Z7401 Bed confinement status: Secondary | ICD-10-CM | POA: Diagnosis not present

## 2015-04-17 DIAGNOSIS — N179 Acute kidney failure, unspecified: Secondary | ICD-10-CM | POA: Diagnosis present

## 2015-04-17 DIAGNOSIS — I2699 Other pulmonary embolism without acute cor pulmonale: Principal | ICD-10-CM

## 2015-04-17 DIAGNOSIS — I959 Hypotension, unspecified: Secondary | ICD-10-CM | POA: Diagnosis present

## 2015-04-17 DIAGNOSIS — Z79899 Other long term (current) drug therapy: Secondary | ICD-10-CM | POA: Diagnosis not present

## 2015-04-17 DIAGNOSIS — M3500 Sicca syndrome, unspecified: Secondary | ICD-10-CM | POA: Diagnosis present

## 2015-04-17 DIAGNOSIS — G8220 Paraplegia, unspecified: Secondary | ICD-10-CM | POA: Diagnosis present

## 2015-04-17 DIAGNOSIS — E119 Type 2 diabetes mellitus without complications: Secondary | ICD-10-CM | POA: Diagnosis present

## 2015-04-17 DIAGNOSIS — Z6841 Body Mass Index (BMI) 40.0 and over, adult: Secondary | ICD-10-CM

## 2015-04-17 DIAGNOSIS — R0600 Dyspnea, unspecified: Secondary | ICD-10-CM | POA: Diagnosis present

## 2015-04-17 DIAGNOSIS — J9601 Acute respiratory failure with hypoxia: Secondary | ICD-10-CM | POA: Diagnosis present

## 2015-04-17 DIAGNOSIS — Z7952 Long term (current) use of systemic steroids: Secondary | ICD-10-CM | POA: Diagnosis not present

## 2015-04-17 LAB — BASIC METABOLIC PANEL
Anion gap: 11 (ref 5–15)
BUN: 26 mg/dL — AB (ref 6–20)
CALCIUM: 8 mg/dL — AB (ref 8.9–10.3)
CHLORIDE: 107 mmol/L (ref 101–111)
CO2: 19 mmol/L — ABNORMAL LOW (ref 22–32)
CREATININE: 1.54 mg/dL — AB (ref 0.44–1.00)
GFR calc Af Amer: 40 mL/min — ABNORMAL LOW (ref >60)
GFR, EST NON AFRICAN AMERICAN: 35 mL/min — AB (ref >60)
Glucose, Bld: 87 mg/dL (ref 65–99)
POTASSIUM: 4.6 mmol/L (ref 3.5–5.1)
Sodium: 137 mmol/L (ref 135–145)

## 2015-04-17 LAB — CBC
HCT: 33.5 % — ABNORMAL LOW (ref 36.0–46.0)
HCT: 26 % — ABNORMAL LOW (ref 36.0–46.0)
HCT: 30.6 % — ABNORMAL LOW (ref 36.0–46.0)
Hemoglobin: 9.9 g/dL — ABNORMAL LOW (ref 12.0–15.0)
Hemoglobin: 7.8 g/dL — ABNORMAL LOW (ref 12.0–15.0)
Hemoglobin: 9.2 g/dL — ABNORMAL LOW (ref 12.0–15.0)
MCH: 24.6 pg — ABNORMAL LOW (ref 26.0–34.0)
MCH: 24.7 pg — ABNORMAL LOW (ref 26.0–34.0)
MCH: 24.6 pg — ABNORMAL LOW (ref 26.0–34.0)
MCHC: 29.6 g/dL — AB (ref 30.0–36.0)
MCHC: 30 g/dL (ref 30.0–36.0)
MCHC: 30.1 g/dL (ref 30.0–36.0)
MCV: 83.1 fL (ref 78.0–100.0)
MCV: 82.3 fL (ref 78.0–100.0)
MCV: 81.8 fL (ref 78.0–100.0)
PLATELETS: 231 10*3/uL (ref 150–400)
PLATELETS: 189 10*3/uL (ref 150–400)
Platelets: 204 10*3/uL (ref 150–400)
RBC: 4.03 MIL/uL (ref 3.87–5.11)
RBC: 3.16 MIL/uL — ABNORMAL LOW (ref 3.87–5.11)
RBC: 3.74 MIL/uL — ABNORMAL LOW (ref 3.87–5.11)
RDW: 17.6 % — AB (ref 11.5–15.5)
RDW: 17.5 % — AB (ref 11.5–15.5)
RDW: 17.7 % — AB (ref 11.5–15.5)
WBC: 9.2 10*3/uL (ref 4.0–10.5)
WBC: 6.1 10*3/uL (ref 4.0–10.5)
WBC: 6.4 10*3/uL (ref 4.0–10.5)

## 2015-04-17 LAB — URINALYSIS, ROUTINE W REFLEX MICROSCOPIC
GLUCOSE, UA: NEGATIVE mg/dL
Hgb urine dipstick: NEGATIVE
Ketones, ur: 15 mg/dL — AB
NITRITE: POSITIVE — AB
PH: 5 (ref 5.0–8.0)
PROTEIN: 100 mg/dL — AB
SPECIFIC GRAVITY, URINE: 1.029 (ref 1.005–1.030)
UROBILINOGEN UA: 2 mg/dL — AB (ref 0.0–1.0)

## 2015-04-17 LAB — MRSA PCR SCREENING: MRSA BY PCR: NEGATIVE

## 2015-04-17 LAB — HEPARIN LEVEL (UNFRACTIONATED)
HEPARIN UNFRACTIONATED: 0.85 [IU]/mL — AB (ref 0.30–0.70)
HEPARIN UNFRACTIONATED: 0.3 [IU]/mL (ref 0.30–0.70)
Heparin Unfractionated: 0.34 IU/mL (ref 0.30–0.70)

## 2015-04-17 LAB — FIBRINOGEN
FIBRINOGEN: 328 mg/dL (ref 204–475)
FIBRINOGEN: 433 mg/dL (ref 204–475)

## 2015-04-17 LAB — URINE MICROSCOPIC-ADD ON

## 2015-04-17 LAB — GLUCOSE, CAPILLARY
Glucose-Capillary: 129 mg/dL — ABNORMAL HIGH (ref 65–99)
Glucose-Capillary: 80 mg/dL (ref 65–99)
Glucose-Capillary: 106 mg/dL — ABNORMAL HIGH (ref 65–99)

## 2015-04-17 LAB — TROPONIN I
TROPONIN I: 0.16 ng/mL — AB (ref <0.031)
TROPONIN I: 0.18 ng/mL — AB (ref <0.031)

## 2015-04-17 LAB — PROTIME-INR
INR: 1.17
Prothrombin Time: 15.1 seconds — ABNORMAL HIGH (ref 11.4–15.0)

## 2015-04-17 LAB — PHOSPHORUS: PHOSPHORUS: 5.5 mg/dL — AB (ref 2.5–4.6)

## 2015-04-17 LAB — MAGNESIUM: Magnesium: 1.9 mg/dL (ref 1.7–2.4)

## 2015-04-17 LAB — APTT: aPTT: 38 seconds — ABNORMAL HIGH (ref 24–36)

## 2015-04-17 MED ORDER — FENTANYL CITRATE (PF) 100 MCG/2ML IJ SOLN
INTRAMUSCULAR | Status: AC | PRN
  Administered 2015-04-17: 25 ug via INTRAVENOUS

## 2015-04-17 MED ORDER — MIDAZOLAM HCL 2 MG/2ML IJ SOLN
INTRAMUSCULAR | Status: AC
  Filled 2015-04-17: qty 2

## 2015-04-17 MED ORDER — BACLOFEN 10 MG PO TABS
10.0000 mg | ORAL_TABLET | Freq: Three times a day (TID) | ORAL | Status: DC
  Administered 2015-04-17 – 2015-04-22 (×15): 10 mg via ORAL
  Filled 2015-04-17 (×18): qty 1

## 2015-04-17 MED ORDER — SODIUM CHLORIDE 0.9 % IV SOLN
12.0000 mg | Freq: Once | INTRAVENOUS | Status: AC
  Administered 2015-04-17: 12 mg via INTRAVENOUS
  Filled 2015-04-17: qty 12

## 2015-04-17 MED ORDER — MIDAZOLAM HCL 2 MG/2ML IJ SOLN
INTRAMUSCULAR | Status: AC | PRN
  Administered 2015-04-17: 1 mg via INTRAVENOUS

## 2015-04-17 MED ORDER — LIDOCAINE HCL 1 % IJ SOLN
INTRAMUSCULAR | Status: AC
  Filled 2015-04-17: qty 20

## 2015-04-17 MED ORDER — CALCIUM CARBONATE 1250 (500 CA) MG PO TABS
1.0000 | ORAL_TABLET | Freq: Every day | ORAL | Status: DC
  Administered 2015-04-18 – 2015-04-22 (×5): 500 mg via ORAL
  Filled 2015-04-17 (×7): qty 1

## 2015-04-17 MED ORDER — HEPARIN BOLUS VIA INFUSION
4000.0000 [IU] | Freq: Once | INTRAVENOUS | Status: AC
  Administered 2015-04-17: 4000 [IU] via INTRAVENOUS

## 2015-04-17 MED ORDER — INSULIN ASPART 100 UNIT/ML ~~LOC~~ SOLN
0.0000 [IU] | Freq: Three times a day (TID) | SUBCUTANEOUS | Status: DC
  Administered 2015-04-18 (×2): 2 [IU] via SUBCUTANEOUS
  Administered 2015-04-19: 3 [IU] via SUBCUTANEOUS
  Administered 2015-04-20 – 2015-04-22 (×6): 2 [IU] via SUBCUTANEOUS

## 2015-04-17 MED ORDER — IOHEXOL 300 MG/ML  SOLN
50.0000 mL | Freq: Once | INTRAMUSCULAR | Status: AC | PRN
  Administered 2015-04-17: 10 mL via INTRAVENOUS

## 2015-04-17 MED ORDER — DEXTROSE 50 % IV SOLN
50.0000 mL | Freq: Once | INTRAVENOUS | Status: AC
  Administered 2015-04-17: 50 mL via INTRAVENOUS
  Filled 2015-04-17: qty 50

## 2015-04-17 MED ORDER — CETYLPYRIDINIUM CHLORIDE 0.05 % MT LIQD
7.0000 mL | Freq: Two times a day (BID) | OROMUCOSAL | Status: DC
  Administered 2015-04-17 – 2015-04-22 (×10): 7 mL via OROMUCOSAL

## 2015-04-17 MED ORDER — SODIUM CHLORIDE 0.9 % IV SOLN: 250.0000 mL | INTRAVENOUS | Status: DC | PRN

## 2015-04-17 MED ORDER — TIZANIDINE HCL 2 MG PO TABS
2.0000 mg | ORAL_TABLET | Freq: Three times a day (TID) | ORAL | Status: DC
  Administered 2015-04-17 – 2015-04-22 (×15): 2 mg via ORAL
  Filled 2015-04-17 (×18): qty 1

## 2015-04-17 MED ORDER — HEPARIN (PORCINE) IN NACL 100-0.45 UNIT/ML-% IJ SOLN
1300.0000 [IU]/h | INTRAMUSCULAR | Status: DC
  Administered 2015-04-17: 900 [IU]/h via INTRAVENOUS
  Filled 2015-04-17: qty 250

## 2015-04-17 MED ORDER — SODIUM CHLORIDE 0.9 % IV BOLUS (SEPSIS): 1000.0000 mL | Freq: Once | INTRAVENOUS | Status: DC

## 2015-04-17 MED ORDER — SODIUM CHLORIDE 0.9 % IV SOLN
INTRAVENOUS | Status: DC
  Administered 2015-04-17 – 2015-04-18 (×3): via INTRAVENOUS

## 2015-04-17 MED ORDER — VITAMIN D3 25 MCG (1000 UNIT) PO TABS
1000.0000 [IU] | ORAL_TABLET | Freq: Every day | ORAL | Status: DC
  Administered 2015-04-18 – 2015-04-22 (×5): 1000 [IU] via ORAL
  Filled 2015-04-17 (×6): qty 1

## 2015-04-17 MED ORDER — SODIUM CHLORIDE 0.9 % IV SOLN
INTRAVENOUS | Status: DC
  Administered 2015-04-17: 10 mL/h via INTRAVENOUS
  Administered 2015-04-17: 15 mL/h via INTRAVENOUS

## 2015-04-17 MED ORDER — SODIUM CHLORIDE 0.9 % IV SOLN
INTRAVENOUS | Status: DC
  Administered 2015-04-17 – 2015-04-18 (×3): via INTRAVENOUS

## 2015-04-17 MED ORDER — VITAMIN C 500 MG PO TABS
500.0000 mg | ORAL_TABLET | Freq: Two times a day (BID) | ORAL | Status: DC
  Administered 2015-04-17 – 2015-04-22 (×11): 500 mg via ORAL
  Filled 2015-04-17 (×13): qty 1

## 2015-04-17 MED ORDER — FENTANYL CITRATE (PF) 100 MCG/2ML IJ SOLN
INTRAMUSCULAR | Status: AC
  Filled 2015-04-17: qty 2

## 2015-04-17 MED ORDER — INSULIN ASPART 100 UNIT/ML ~~LOC~~ SOLN
10.0000 [IU] | Freq: Once | SUBCUTANEOUS | Status: AC
  Administered 2015-04-17: 10 [IU] via INTRAVENOUS

## 2015-04-17 MED ORDER — AZATHIOPRINE 50 MG PO TABS
100.0000 mg | ORAL_TABLET | Freq: Two times a day (BID) | ORAL | Status: DC
  Administered 2015-04-17 – 2015-04-22 (×11): 100 mg via ORAL
  Filled 2015-04-17 (×13): qty 2

## 2015-04-17 MED ORDER — SENNA 8.6 MG PO TABS
2.0000 | ORAL_TABLET | Freq: Every evening | ORAL | Status: DC | PRN
  Filled 2015-04-17: qty 2

## 2015-04-17 MED ORDER — PANTOPRAZOLE SODIUM 40 MG PO TBEC
40.0000 mg | DELAYED_RELEASE_TABLET | Freq: Every day | ORAL | Status: DC
  Administered 2015-04-18 – 2015-04-22 (×5): 40 mg via ORAL
  Filled 2015-04-17 (×5): qty 1

## 2015-04-17 MED ORDER — GABAPENTIN 400 MG PO CAPS
400.0000 mg | ORAL_CAPSULE | ORAL | Status: DC
  Administered 2015-04-17 – 2015-04-22 (×11): 400 mg via ORAL
  Filled 2015-04-17 (×13): qty 1

## 2015-04-17 MED ORDER — PREDNISONE 20 MG PO TABS
30.0000 mg | ORAL_TABLET | Freq: Every day | ORAL | Status: DC
  Administered 2015-04-17 – 2015-04-19 (×3): 30 mg via ORAL
  Filled 2015-04-17 (×4): qty 1

## 2015-04-17 MED ORDER — CARBONYL IRON 45 MG PO TABS
45.0000 mg | ORAL_TABLET | Freq: Every day | ORAL | Status: DC
  Filled 2015-04-17: qty 1

## 2015-04-17 MED ORDER — GABAPENTIN 300 MG PO CAPS
600.0000 mg | ORAL_CAPSULE | Freq: Every day | ORAL | Status: DC
  Administered 2015-04-17 – 2015-04-21 (×6): 600 mg via ORAL
  Filled 2015-04-17 (×7): qty 2

## 2015-04-17 MED ORDER — SODIUM CHLORIDE 0.9 % IJ SOLN
3.0000 mL | Freq: Two times a day (BID) | INTRAMUSCULAR | Status: DC
  Administered 2015-04-18 (×2): 3 mL via INTRAVENOUS

## 2015-04-17 MED ORDER — SODIUM CHLORIDE 0.9 % IV SOLN
INTRAVENOUS | Status: DC
  Administered 2015-04-17 – 2015-04-18 (×2): via INTRAVENOUS

## 2015-04-17 MED ORDER — SODIUM CHLORIDE 0.9 % IV SOLN
INTRAVENOUS | Status: DC
  Administered 2015-04-17: 15 mL/h via INTRAVENOUS
  Administered 2015-04-17: 10 mL/h via INTRAVENOUS

## 2015-04-17 MED ORDER — FERROUS SULFATE 325 (65 FE) MG PO TABS
325.0000 mg | ORAL_TABLET | Freq: Every day | ORAL | Status: DC
  Administered 2015-04-18 – 2015-04-22 (×5): 325 mg via ORAL
  Filled 2015-04-17 (×7): qty 1

## 2015-04-17 MED ORDER — MIRABEGRON ER 25 MG PO TB24
25.0000 mg | ORAL_TABLET | Freq: Every day | ORAL | Status: DC
  Administered 2015-04-17 – 2015-04-18 (×3): 25 mg via ORAL
  Filled 2015-04-17 (×4): qty 1

## 2015-04-17 MED ORDER — ALENDRONATE SODIUM 70 MG PO TABS: 70.0000 mg | ORAL_TABLET | ORAL | Status: DC

## 2015-04-17 MED ORDER — HEPARIN (PORCINE) IN NACL 100-0.45 UNIT/ML-% IJ SOLN
1450.0000 [IU]/h | INTRAMUSCULAR | Status: DC
  Administered 2015-04-17: 1300 [IU]/h via INTRAVENOUS
  Administered 2015-04-17: 1150 [IU]/h via INTRAVENOUS
  Administered 2015-04-20 – 2015-04-21 (×2): 1450 [IU]/h via INTRAVENOUS
  Filled 2015-04-17 (×7): qty 250

## 2015-04-17 MED ORDER — DARIFENACIN HYDROBROMIDE ER 7.5 MG PO TB24
7.5000 mg | ORAL_TABLET | Freq: Every day | ORAL | Status: DC
  Administered 2015-04-18 – 2015-04-22 (×5): 7.5 mg via ORAL
  Filled 2015-04-17 (×6): qty 1

## 2015-04-17 MED ORDER — SODIUM CHLORIDE 0.9 % IJ SOLN: 3.0000 mL | INTRAMUSCULAR | Status: DC | PRN

## 2015-04-17 NOTE — Progress Notes (Signed)
ANTICOAGULATION CONSULT NOTE - Follow-up Consult  Pharmacy Consult for heparin drip Indication: pulmonary embolus  Allergies  Allergen Reactions  . Bee Venom Anaphylaxis  . Tocilizumab     Patient Measurements: Height: 5\' 3"  (160 cm) Weight: 210 lb 8.6 oz (95.5 kg) IBW/kg (Calculated) : 52.4  Ht: 63 in  Wt: 96 kg Heparin Dosing Weight: 75 kg  Vital Signs: Temp: 97.4 F (36.3 C) (06/03 0630) Temp Source: Oral (06/03 0630) BP: 97/72 mmHg (06/03 0905) Pulse Rate: 106 (06/03 0905)  Labs:  Recent Labs  04/16/15 2137 04/17/15 0228 04/17/15 0754  HGB 10.6*  --  9.9*  HCT 34.9*  --  33.5*  PLT 226  --  231  APTT 38*  --   --   LABPROT 15.1*  --   --   INR 1.17  --   --   HEPARINUNFRC  --   --  0.85*  CREATININE 1.62*  --  1.54*  TROPONINI 0.12* 0.16* 0.18*    Estimated Creatinine Clearance: 41.1 mL/min (by C-G formula based on Cr of 1.54).   Medical History: Past Medical History  Diagnosis Date  . Neuromuscular disorder   . Neuromyelitis optica   . Diabetes mellitus without complication   . Hypertension   . Paraplegia   . Sjogren's syndrome     Medications:  Awaiting home med rec  Assessment: 64 y.o. female presented to [place] with SOB and found to have extensive b/l PE on CT scan with R heart strain (RV/LV ratio 2.4). Heparin gtt [facility] (4000 unit bolus and 1300 units/hr) ~0030. CBC ok at baseline. Noted pt with admission for lower GIB 12/2014.  Heparin level = 0.85 supra-therapeutic on 1300 units/hr. Patient is gone down to IR for EKOS catheter placement & tpa lysis this AM (bilateral catheter placement).  Goal of Therapy:  Heparin level 0.3-0.7 units/ml (aim for 0.5-0.7 with extensive clot) Monitor platelets by anticoagulation protocol: Yes   Plan:  Decrease heparin to 1150 units/hr Repeat heparin level in 6 hours Daily heparin level and CBC Follow-up toleration of tPA infusion and fibrinogen levels Follow-up signs and  symptoms of bleeding  Link SnufferJessica Ava Tangney, PharmD, BCPS Clinical Pharmacist [phone]  04/17/2015,9:05 AM

## 2015-04-17 NOTE — Consult Note (Signed)
Chief Complaint: Pulmonary Embolism  Referring Physician(s): Dr. Vaughan Basta, Critical Care  History of Present Illness: Susan Shepherd is a 64 y.o. female who presented to [facility] with 24-48 hours of fatigue and weakness.  She was discovered to have bilateral pulmonary embolism on CT study in the ED at [facility].   This CT shows evidence of right heart strain, with a RV/LV ratio of approximately 2.0, and Critical Care and Interventional Radiology were contacted.  Susan Shepherd was interviewed in the CCU at [facility].  She reports that she had fatigue and weakness/tiredness starting on Wednesday, which continued through Thursday.  She felt fatigued enough that she cancelled her physical therapy appointment on Thursday, which she says was her first ever cancellation.  An aid that saw her on Thursday evening 04/16/2015 convinced her to go to the ED to get evaluated.   She denies any chest pain or dyspnea.  She reports that since arriving at the ED, her breathing has improved, and overall she feels better.  No syncope or near syncope.  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  She states that she has a labile blood pressure at baseline, with systolics that frequently as low as 90 mmHg, and can be as high as 150 mmHg.  She says that she does not know her average blood pressure.   She reports that she has bilateral lower extremity swelling, which has not changed significantly recently.  Since arriving to the hospital, her blood pressure has ranged from 80-110, with the most recent blood pressure during the interview ranging from 90 - 100 mmHg.  Respirations are comfortable, with rate of 12-15 /min.  She is 100% O2 with [place] 4L.  HR is 100, which has not significantly changed.    Susan Shepherd has a history of GI bleeding in February of 2016 attributed to diverticular disease.  She has no prior stroke.  No recent surgery.    Past  Medical History  Diagnosis Date  . Neuromuscular disorder   . Neuromyelitis optica   . Diabetes mellitus without complication   . Hypertension   . Paraplegia   . Sjogren's syndrome     Past Surgical History  Procedure Laterality Date  . Eye surgery      Allergies: Bee venom  Medications: Prior to Admission medications   Medication Sig Start Date End Date Taking? Authorizing Provider  alendronate (FOSAMAX) 70 MG tablet Take 70 mg by mouth once a week. Saturday. Take with a full glass of water on an empty stomach.    Historical Provider, MD  azaTHIOprine (IMURAN) 50 MG tablet Take 100 mg by mouth 2 (two) times daily.     Historical Provider, MD  baclofen (LIORESAL) 10 MG tablet Take 10 mg by mouth 3 (three) times daily.     Historical Provider, MD  calcium carbonate (OS-CAL - DOSED IN MG OF ELEMENTAL CALCIUM) 1250 (500 CA) MG tablet Take 1 tablet by mouth daily with breakfast.    Historical Provider, MD  carbonyl iron (FEOSOL) 45 MG TABS tablet Take 45 mg by mouth daily.    Historical Provider, MD  carvedilol (COREG) 3.125 MG tablet Take 3.125 mg by mouth 2 (two) times daily with a meal.    Historical Provider, MD  cholecalciferol (VITAMIN D) 1000 UNITS tablet Take 1,000 Units by mouth daily.    Historical Provider, MD  ciprofloxacin (CIPRO) 500 MG tablet Take 500 mg by mouth 2 (two) times daily.  For 10 days.    Historical Provider, MD  EPINEPHrine 0.3 mg/0.3 mL IJ SOAJ injection Inject 0.3 mg into the muscle once.    Historical Provider, MD  gabapentin (NEURONTIN) 300 MG capsule Take 600 mg by mouth at bedtime.    Historical Provider, MD  gabapentin (NEURONTIN) 400 MG capsule Take 400 mg by mouth 2 (two) times daily. At 9:am and noon    Historical Provider, MD  metFORMIN (GLUCOPHAGE) 500 MG tablet Take 500 mg by mouth 2 (two) times daily with a meal.    Historical Provider, MD  mirabegron ER (MYRBETRIQ) 25 MG TB24 tablet Take 25 mg by mouth at bedtime.    Historical Provider, MD    omeprazole (PRILOSEC) 40 MG capsule Take 40 mg by mouth daily.    Historical Provider, MD  predniSONE (DELTASONE) 10 MG tablet Take 30 mg by mouth daily with breakfast.     Historical Provider, MD  senna (SENOKOT) 8.6 MG TABS tablet Take 2 tablets by mouth at bedtime as needed for mild constipation or moderate constipation.    Historical Provider, MD  solifenacin (VESICARE) 10 MG tablet Take 10 mg by mouth daily.    Historical Provider, MD  sulfamethoxazole-trimethoprim (BACTRIM,SEPTRA) 400-80 MG per tablet Take 1 tablet by mouth daily.    Historical Provider, MD  tiZANidine (ZANAFLEX) 2 MG tablet Take 2 mg by mouth 3 (three) times daily. Take 6mg  at 8, and noon, then 4mg  at 6:pm    Historical Provider, MD  vitamin C (ASCORBIC ACID) 500 MG tablet Take 500 mg by mouth 2 (two) times daily.    Historical Provider, MD     Family History  Problem Relation Age of Onset  . Adopted: Yes    History   Social History  . Marital Status: Single    Spouse Name: N/A  . Number of Children: N/A  . Years of Education: N/A   Social History Main Topics  . Smoking status: Never Smoker   . Smokeless tobacco: Not on file  . Alcohol Use: No  . Drug Use: Not on file  . Sexual Activity: Not on file   Other Topics Concern  . Not on file   Social History Narrative      Review of Systems: A 12 point ROS discussed and pertinent positives are indicated in the HPI above.  All other systems are negative.  Review of Systems  Vital Signs: There were no vitals taken for this visit.  Physical Exam   Atraumatic, normocephalic.  Mucous membranes moist, pink.  No icterus or scleral injection. Neck is soft supple no adenopathy. Full range of motion of the cervical region. Lungs are clear to auscultation. No labored breathing. Respirations are 12-15/m. No accessory muscles for respiration. Tachycardic with no third heart sounds. Abdomen soft nontender. No muscle guarding or rigidity. Genitourinary is  deferred. Upper extremity with full range of motion and full and equal strength. Lower extremity demonstrates abnormal sensation bilateral, with essentially 0 out of 5 strength. She can wiggle her left-sided toes, with no motion of the right toes. (Transverse myelitis) Bilateral lower extremity pitting edema, symmetric.   Mallampati Score:     Imaging: Ct Angio Chest Pe W/cm &/or Wo Cm  04/16/2015   CLINICAL DATA:  Respiratory distress. Two day history of weakness and difficulty breathing.  EXAM: CT ANGIOGRAPHY CHEST WITH CONTRAST  TECHNIQUE: Multidetector CT imaging of the chest was performed using the standard protocol during bolus administration of intravenous contrast. Multiplanar CT image reconstructions  and MIPs were obtained to evaluate the vascular anatomy.  CONTRAST:  80mL OMNIPAQUE IOHEXOL 350 MG/ML SOLN  COMPARISON:  Chest CT 07/28/2011  FINDINGS: Examination is positive for extensive bilateral pulmonary emboli. There is filling defects within the right and left main pulmonary arteries extending into all lobar branches. Greatest thromboembolic burden is in the right middle lobe.  There is marked right heart strain, RV to LV ratio of 2.4. Contrast refluxes into the IVC and hepatic veins.  No definite pulmonary infarct. Linear opacities in the anterior left upper, right and left lower lobes consistent atelectasis. There are faint perihilar ground-glass opacities in the right upper lobe.  There is no pleural or pericardial effusion. The thoracic aorta is normal in caliber. No mediastinal or hilar adenopathy. Minimal soft tissue density in the anterior mediastinal is of uncertain significance.  No definite acute abnormality in the upper abdomen. There are no acute or suspicious osseous abnormalities.  Review of the MIP images confirms the above findings.  IMPRESSION: Positive for acute PE with CT evidence of right heart strain (RV/LV Ratio = 2.4) consistent with at least submassive (intermediate  risk) PE. The presence of right heart strain has been associated with an increased risk of morbidity and mortality. Please activate Code PE by paging [phone].  Critical Value/emergent results were called by telephone at the time of interpretation on 04/16/2015 at 11:34 pm to Dr. Gladstone Pih , who verbally acknowledged these results.   Electronically Signed   By: Rubye Oaks M.D.   On: 04/16/2015 23:40    Labs:  CBC:  Recent Labs  01/10/15 0305 01/10/15 1309 01/11/15 0302 04/16/15 2137  WBC 9.5 8.9 8.6 8.2  HGB 8.0* 8.1* 8.1* 10.6*  HCT 23.7* 24.2* 24.1* 34.9*  PLT 123* 132* 144* 226    COAGS:  Recent Labs  10/17/14 1437 12/12/14 0435 01/09/15 1229 04/16/15 2137  INR 1.0 1.0 1.15 1.17  APTT  --  23.0* 28 38*    BMP:  Recent Labs  01/09/15 1229 01/10/15 0305 01/11/15 0302 04/16/15 2137  NA 143 141 146* 138  K 3.4* 3.3* 3.6 5.5*  CL 112 113* 113* 104  CO2 21*  GLUCOSE 119* 132* 70 153*  BUN 26*  CALCIUM 7.2* 7.0* 7.7* 9.1  CREATININE 0.65 0.52 0.69 1.62*  GFRNONAA >90 >90 >90 33*  GFRAA >90 >90 >90 38*    LIVER FUNCTION TESTS:  Recent Labs  01/09/15 0014 01/09/15 1229 01/11/15 0302 04/16/15 2137  BILITOT 0.7 1.0 0.5 0.8  AST 71*  ALT 35  ALKPHOS 37* 39 35* 62  PROT 3.8* 4.7* 4.4* 6.7  ALBUMIN 2.2* 2.6* 2.4* 3.3*    TUMOR MARKERS: No results for input(s): AFPTM, CEA, CA199, CHROMGRNA in the last 8760 hours.  Assessment and Plan:  Susan Shepherd is a 64 year-old woman presenting with sub-massive category pulmonary embolism, defined by her bilateral pulmonary embolism, no signs of cardiovascular shock, with evidence of right sided heart strain.  Currently she is hemodynamically stable in the ICU, with improving O2 requirement.   I believe Susan Shepherd is a candidate for catheter directed thrombolysis, with the goal of improving her pulmonary physiology and lowering her 30-day/68-month mortality/mobidity.   Her GI hemorrhage occurred in February, which is at least 3 months prior, with no contra-indication to initiating catheter directed lysis.    I had a long discussion with Susan Shepherd and her son regarding the pathology, pathophysiology,  natural history, and treatment methods of sub-massive PE.  I discussed catheter directed thrombolysis with EKOS technology and tPA delivery in detail, with discussion of a risk benefit analysis and full informed consent provided.  Specific risks discussed include: bleeding, infection, vascular injury, contrast reaction, kidney injury, need for further procedure/surgery, cardiopulmonary collapse, death.  All of her and her son's questions were answered to the best of my ability.    Consent signed and on the chart. We will proceed am of 04/17/2015.      Thank you for this interesting consult.  I greatly enjoyed meeting Susan Shepherd and look forward to participating in her care.  SignedGilmer Mor 04/17/2015, 2:29 AM   I spent a total of 80 Minutes    in face to face in clinical consultation, greater than 50% of which was counseling/coordinating care for submassive pulmonary embolism, treatment with catheter-directed thrombolysis/thrombectomy.

## 2015-04-17 NOTE — Progress Notes (Signed)
ANTICOAGULATION CONSULT NOTE - Follow-up Consult  Pharmacy Consult for heparin drip Indication: pulmonary embolus  Allergies  Allergen Reactions  . Bee Venom Anaphylaxis    Patient Measurements:    Ht: 63 in  Wt: 96 kg Heparin Dosing Weight: 75 kg  Vital Signs: Temp: 98.5 F (36.9 C) (06/03 0015) BP: 80/63 mmHg (06/03 0015) Pulse Rate: 102 (06/03 0015)  Labs:  Recent Labs  04/16/15 2137  HGB 10.6*  HCT 34.9*  PLT 226  CREATININE 1.62*  TROPONINI 0.12*    Estimated Creatinine Clearance: 39.3 mL/min (by C-G formula based on Cr of 1.62).   Medical History: Past Medical History  Diagnosis Date  . Neuromuscular disorder   . Neuromyelitis optica   . Diabetes mellitus without complication   . Hypertension   . Paraplegia   . Sjogren's syndrome     Medications:  Awaiting home med rec  Assessment: 64 y.o. female presents to [place] with SOB. Found to have extensive b/l PE on CT scan with R heart strain. Heparin gtt started at [facility] (4000 unit bolus and 1300 units/hr) ~0030. CBC ok at baseline.   Noted pt with admission for lower GIB 12/2104  Goal of Therapy:  Heparin level 0.3-0.7 units/ml (aim for 0.5-0.7 with extensive clot) Monitor platelets by anticoagulation protocol: Yes   Plan:  Continue heparin at 1300 units/hr Will f/u heparin level at 0630 (6 hr post gtt start) Daily heparin level and CBC  Christoper Fabianaron Andie Mungin, PharmD, BCPS Clinical pharmacist, pager [phone] 04/17/2015,1:33 AM

## 2015-04-17 NOTE — Progress Notes (Signed)
eLink Physician-Brief Progress Note Patient Name: Susan HeadyRosalie Menzel DOB: [birth year] MRN: [id number]   Date of Service  04/17/2015  HPI/Events of Note  Patient has completed a MOST form indicating that she does not desire resusitation (CPR, ACLS protocol or intubation) in the event that her heart should stop or she should stop breathing.   eICU Interventions  Will make patient DNR/DNI.     Intervention Category Minor Interventions: Routine modifications to care plan (e.g. PRN medications for pain, fever)  Brynlie Daza Eugene 04/17/2015, 5:43 AM

## 2015-04-17 NOTE — Progress Notes (Signed)
New drainage noticed on Sheath dressing, dressing reinforced.  IR called, was instructed to monitor and continue to reinforce dressing until morning.

## 2015-04-17 NOTE — Progress Notes (Signed)
Primary MD Dr Hinda LenisSharon Reilly  [facility] [phone] cell [phone] office

## 2015-04-17 NOTE — Sedation Documentation (Signed)
L PA pressure 70/40 mean 50 R PA pressure 50/18 mean 30

## 2015-04-17 NOTE — Progress Notes (Signed)
ANTICOAGULATION CONSULT NOTE - Follow-up Consult  Pharmacy Consult for Heparin Indication: pulmonary embolus  Allergies  Allergen Reactions  . Bee Venom Anaphylaxis  . Tocilizumab     Patient Measurements: Height: 5\' 3"  (160 cm) Weight: 210 lb 8.6 oz (95.5 kg) IBW/kg (Calculated) : 52.4  Heparin Dosing Weight: 75 kg  Vital Signs: Temp: 98.4 F (36.9 C) (06/03 1535) Temp Source: Oral (06/03 1535) BP: 110/80 mmHg (06/03 1700) Pulse Rate: 108 (06/03 1700)  Labs:  Recent Labs  04/16/15 2137 04/17/15 0228 04/17/15 0754 04/17/15 1600  HGB 10.6*  --  9.9* 7.8*  HCT 34.9*  --  33.5* 26.0*  PLT 226  --  231 189  APTT 38*  --   --   --   LABPROT 15.1*  --   --   --   INR 1.17  --   --   --   HEPARINUNFRC  --   --  0.85* 0.30  CREATININE 1.62*  --  1.54*  --   TROPONINI 0.12* 0.16* 0.18*  --     Estimated Creatinine Clearance: 41.1 mL/min (by C-G formula based on Cr of 1.54).  Assessment: 64 y.o. female presented to [place] with SOB and found to have extensive b/l PE on CT scan with R heart strain (RV/LV ratio 2.4). Heparin gtt started at [facility] (4000 unit bolus and 1300 units/hr) ~0030. CBC ok at baseline. Noted pt with admission for lower GIB 12/2014.  Heparin level is now 0.3 on 1100 units/hr. Low therapeutic, but goal is upper-range therapeutic.  Prior level was 0.89 (above goal) on 1300 units/hr. Hgb trended down to 9.9->7.8, platelet count 231->189. Alteplase infusing via EKOS catheter (bilateral catheter placement).  RN reports new drainage on sheath dressing, serosanguinous.  No frank bleeding. Plan back to IR in am for EKOS removal.  Goal of Therapy:  Heparin level 0.3-0.7 units/ml (aim for 0.5-0.7 with extensive clot) Monitor platelets by anticoagulation protocol: Yes   Plan:  Increase heparin drip to 1250 units/hr. Next heparin level in 6 hours. Daily heparin level and CBC. Follow-up toleration of tPA infusion and fibrinogen levels. Follow-up  signs and symptoms of bleeding.  Nicolette Bangheresa Jocelynn Gioffre, RPh Pager: [phone] 04/17/2015,6:14 PM

## 2015-04-17 NOTE — Progress Notes (Signed)
Heparin level 0.34, drawn just 2hr after rate change; will continue for now and recheck w/ next Q6H labs.  Vernard GamblesVeronda Foxx Klarich, PharmD, BCPS 04/17/2015 11:40 PM

## 2015-04-17 NOTE — Progress Notes (Addendum)
ANTICOAGULATION CONSULT NOTE - Initial Consult  Pharmacy Consult for heparin drip Indication: pulmonary embolus  Allergies  Allergen Reactions  . Bee Venom Anaphylaxis    Patient Measurements: Height: 5\' 3"  (160 cm) Weight: 212 lb 8.4 oz (96.4 kg) IBW/kg (Calculated) : 52.4 Heparin Dosing Weight: 75 kg  Vital Signs: Temp: 98.5 F (36.9 C) (06/02 2358) BP: 92/61 mmHg (06/02 2358) Pulse Rate: 101 (06/02 2358)  Labs:  Recent Labs  04/16/15 2137  HGB 10.6*  HCT 34.9*  PLT 226  CREATININE 1.62*  TROPONINI 0.12*    Estimated Creatinine Clearance: 39.3 mL/min (by C-G formula based on Cr of 1.62).   Medical History: Past Medical History  Diagnosis Date  . Neuromuscular disorder   . Neuromyelitis optica   . Diabetes mellitus without complication   . Hypertension   . Paraplegia   . Sjogren's syndrome     Medications:    Assessment: aPTT and INR pending  Goal of Therapy:  Heparin level 0.3-0.7 units/ml Monitor platelets by anticoagulation protocol: Yes   Plan:  4000 unit bolus and initial rate of 1300 units/hr. Pt to be transferred to OSH per ED RN.  Katieann Hungate S 04/17/2015,12:07 AM

## 2015-04-17 NOTE — Progress Notes (Addendum)
VASCULAR LAB PRELIMINARY  PRELIMINARY  PRELIMINARY  PRELIMINARY  Bilateral lower extremity venous duplex  completed.    Preliminary report:  Right:  Unable to image CFV/SFJ due to EKOS catheter. Non occlusive DVT noted in the popliteal vein.   Unalbe to adeqately visualize peroneal vein. Cannot rule out DVT. Superficial thrombosis noted in the LSV.  No Baker's cyst.  Right:  Non occlusive, focal DVT noted in the distal  CFV .  Occlusive DVT noted in the popliteal vein.  Unable to adequately visualize the calf.  Cannot rule out DVT in the PTV and peroneal v.  No evidence of superficial thrombosis.  No Baker's cyst.   Freemon Binford, RVT 04/17/2015, 11:32 AM

## 2015-04-17 NOTE — ED Notes (Signed)
Report called [facility] Slider[facility]

## 2015-04-17 NOTE — H&P (Signed)
PULMONARY / CRITICAL CARE MEDICINE   Name: Susan Shepherd MRN: [id number] DOB: [birth year]    ADMISSION DATE:  04/17/2015 CONSULTATION DATE:  04/17/2015  REFERRING MD :  [facility] ED  CHIEF COMPLAINT:  SOB  INITIAL PRESENTATION:  64 y.o. F presented to [facility] with SOB x 2 days.  Found to have bilateral PE.  Transferred to [facility] for consideration EKOS.   STUDIES:  CTA chest 6/2 >>> acute bilateral PE with CT evidence of right heart strain (RV / LV 2.4).  SIGNIFICANT EVENTS: 6/2 - transferred to [facility] ICU with bilateral PE for consideration EKOS.   HISTORY OF PRESENT ILLNESS:   Susan Shepherd is a 64 y.o. F with PMH as outlined below.  She presented to [facility] on 6/2 for SOB x 2 days.  Stated symptoms had worsened since onset.  Had one episode similar a few weeks ago but resolved on it's own.  Denies any hx of VTE, LE pain, hemoptysis, malignancies, clotting disorders.  Does have LE swelling which she states is chronic.  She is sedentary as she is also bed bound to due being paralyzed from the waist down (transfers with a wheel chair).  In ED, CTA revealed bilateral acute PE with CT evidence of right heart strain (RV / LV 2.4).  In addition, initial troponin mildly elevated at 0.12 and pt hypotensive with SBP in 80's.  She does report that her BP's are very labile and that is normal for her.   PAST MEDICAL HISTORY :   has a past medical history of Neuromuscular disorder; Neuromyelitis optica; Diabetes mellitus without complication; Hypertension; Paraplegia; and Sjogren's syndrome.  has past surgical history that includes Eye surgery. Prior to Admission medications   Medication Sig Start Date End Date Taking? Authorizing Provider  alendronate (FOSAMAX) 70 MG tablet Take 70 mg by mouth once a week. Saturday. Take with a full glass of water on an empty stomach.    Historical Provider, MD  azaTHIOprine (IMURAN) 50 MG tablet Take 100 mg by mouth 2 (two) times daily.     Historical Provider, MD   baclofen (LIORESAL) 10 MG tablet Take 10 mg by mouth 3 (three) times daily.     Historical Provider, MD  calcium carbonate (OS-CAL - DOSED IN MG OF ELEMENTAL CALCIUM) 1250 (500 CA) MG tablet Take 1 tablet by mouth daily with breakfast.    Historical Provider, MD  carbonyl iron (FEOSOL) 45 MG TABS tablet Take 45 mg by mouth daily.    Historical Provider, MD  carvedilol (COREG) 3.125 MG tablet Take 3.125 mg by mouth 2 (two) times daily with a meal.    Historical Provider, MD  cholecalciferol (VITAMIN D) 1000 UNITS tablet Take 1,000 Units by mouth daily.    Historical Provider, MD  ciprofloxacin (CIPRO) 500 MG tablet Take 500 mg by mouth 2 (two) times daily. For 10 days.    Historical Provider, MD  EPINEPHrine 0.3 mg/0.3 mL IJ SOAJ injection Inject 0.3 mg into the muscle once.    Historical Provider, MD  gabapentin (NEURONTIN) 300 MG capsule Take 600 mg by mouth at bedtime.    Historical Provider, MD  gabapentin (NEURONTIN) 400 MG capsule Take 400 mg by mouth 2 (two) times daily. At 9:am and noon    Historical Provider, MD  metFORMIN (GLUCOPHAGE) 500 MG tablet Take 500 mg by mouth 2 (two) times daily with a meal.    Historical Provider, MD  mirabegron ER (MYRBETRIQ) 25 MG TB24 tablet Take 25 mg by mouth at bedtime.  Historical Provider, MD  omeprazole (PRILOSEC) 40 MG capsule Take 40 mg by mouth daily.    Historical Provider, MD  predniSONE (DELTASONE) 10 MG tablet Take 30 mg by mouth daily with breakfast.     Historical Provider, MD  senna (SENOKOT) 8.6 MG TABS tablet Take 2 tablets by mouth at bedtime as needed for mild constipation or moderate constipation.    Historical Provider, MD  solifenacin (VESICARE) 10 MG tablet Take 10 mg by mouth daily.    Historical Provider, MD  sulfamethoxazole-trimethoprim (BACTRIM,SEPTRA) 400-80 MG per tablet Take 1 tablet by mouth daily.    Historical Provider, MD  tiZANidine (ZANAFLEX) 2 MG tablet Take 2 mg by mouth 3 (three) times daily. Take 6mg  at 8, and noon,  then 4mg  at 6:pm    Historical Provider, MD  vitamin C (ASCORBIC ACID) 500 MG tablet Take 500 mg by mouth 2 (two) times daily.    Historical Provider, MD   Allergies  Allergen Reactions  . Bee Venom Anaphylaxis    FAMILY HISTORY:  Family History  Problem Relation Age of Onset  . Adopted: Yes    SOCIAL HISTORY:  reports that she has never smoked. She does not have any smokeless tobacco history on file. She reports that she does not drink alcohol. Her drug history is not on file.  REVIEW OF SYSTEMS:   All negative; except for those that are bolded, which indicate positives.  Constitutional: weight loss, weight gain, night sweats, fevers, chills, fatigue, weakness.  HEENT: headaches, sore throat, sneezing, nasal congestion, post nasal drip, difficulty swallowing, tooth/dental problems, visual complaints, visual changes, ear aches. Neuro: difficulty with speech, weakness, numbness, ataxia. CV:  chest pain, orthopnea, PND, swelling in lower extremities, dizziness, palpitations, syncope.  Resp: cough, hemoptysis, dyspnea, wheezing. GI  heartburn, indigestion, abdominal pain, nausea, vomiting, diarrhea, constipation, change in bowel habits, loss of appetite, hematemesis, melena, hematochezia.  GU: dysuria, change in color of urine, urgency or frequency, flank pain, hematuria. MSK: joint pain or swelling, decreased range of motion. Psych: change in mood or affect, depression, anxiety, suicidal ideations, homicidal ideations. Skin: rash, itching, bruising.  SUBJECTIVE:  Breathing has somewhat improved since arrival in ED, but still dyspneic.  VITAL SIGNS: Temp:  [98.3 F (36.8 C)-98.5 F (36.9 C)] 98.5 F (36.9 C) (06/03 0015) Pulse Rate:  [99-103] 102 (06/03 0015) Resp:  [22-32] 26 (06/03 0015) BP: (80-103)/(58-66) 80/63 mmHg (06/03 0015) SpO2:  [93 %-99 %] 97 % (06/03 0015) Weight:  [96.4 kg (212 lb 8.4 oz)] 96.4 kg (212 lb 8.4 oz) (06/02 2125) HEMODYNAMICS:   VENTILATOR  SETTINGS:   INTAKE / OUTPUT: Intake/Output    None     PHYSICAL EXAMINATION: General: Pleasant adult AA female, in NAD. Neuro: A&O x 3, non-focal.  HEENT: [place]/AT. PERRL, sclerae anicteric.  [place] in place. Cardiovascular: Tachy, regular, no M/R/G.  Lungs: Respirations even and unlabored.  CTA bilaterally, No W/R/R.  Abdomen: BS x 4, soft, NT/ND.  Musculoskeletal: Paralyzed from waist down.  No gross deformities, 2+ pitting edema bilaterally to knees. Skin: Intact, warm, no rashes.  LABS:  CBC  Recent Labs Lab 04/16/15 2137  WBC 8.2  HGB 10.6*  HCT 34.9*  PLT 226   Coag's No results for input(s): APTT, INR in the last 168 hours. BMET  Recent Labs Lab 04/16/15 2137  NA 138  K 5.5*  CL 104  CO2 21*  BUN 26*  CREATININE 1.62*  GLUCOSE 153*   Electrolytes  Recent Labs Lab 04/16/15 2137  CALCIUM 9.1   Sepsis Markers No results for input(s): LATICACIDVEN, PROCALCITON, O2SATVEN in the last 168 hours. ABG No results for input(s): PHART, PCO2ART, PO2ART in the last 168 hours. Liver Enzymes  Recent Labs Lab 04/16/15 2137  AST 71*  ALT 35  ALKPHOS 62  BILITOT 0.8  ALBUMIN 3.3*   Cardiac Enzymes  Recent Labs Lab 04/16/15 2137  TROPONINI 0.12*   Glucose  Recent Labs Lab 04/17/15 0114  GLUCAP 129*    Imaging Ct Angio Chest Pe W/cm &/or Wo Cm  04/16/2015   CLINICAL DATA:  Respiratory distress. Two day history of weakness and difficulty breathing.  EXAM: CT ANGIOGRAPHY CHEST WITH CONTRAST  TECHNIQUE: Multidetector CT imaging of the chest was performed using the standard protocol during bolus administration of intravenous contrast. Multiplanar CT image reconstructions and MIPs were obtained to evaluate the vascular anatomy.  CONTRAST:  80mL OMNIPAQUE IOHEXOL 350 MG/ML SOLN  COMPARISON:  Chest CT 07/28/2011  FINDINGS: Examination is positive for extensive bilateral pulmonary emboli. There is filling defects within the right and left main pulmonary arteries  extending into all lobar branches. Greatest thromboembolic burden is in the right middle lobe.  There is marked right heart strain, RV to LV ratio of 2.4. Contrast refluxes into the IVC and hepatic veins.  No definite pulmonary infarct. Linear opacities in the anterior left upper, right and left lower lobes consistent atelectasis. There are faint perihilar ground-glass opacities in the right upper lobe.  There is no pleural or pericardial effusion. The thoracic aorta is normal in caliber. No mediastinal or hilar adenopathy. Minimal soft tissue density in the anterior mediastinal is of uncertain significance.  No definite acute abnormality in the upper abdomen. There are no acute or suspicious osseous abnormalities.  Review of the MIP images confirms the above findings.  IMPRESSION: Positive for acute PE with CT evidence of right heart strain (RV/LV Ratio = 2.4) consistent with at least submassive (intermediate risk) PE. The presence of right heart strain has been associated with an increased risk of morbidity and mortality. Please activate Code PE by paging [phone].  Critical Value/emergent results were called by telephone at the time of interpretation on 04/16/2015 at 11:34 pm to Dr. Gladstone Pih , who verbally acknowledged these results.   Electronically Signed   By: Rubye Oaks M.D.   On: 04/16/2015 23:40     ASSESSMENT / PLAN:  PULMONARY A: Acute hypoxic respiratory failure due to bilateral PE with CT evidence of right heart strain (RV / LV 2.4) P:   Continue supplemental O2 as needed to maintain SpO2 > 92%. Continue systemic heparin. IR consulted for EKOS. F/u on echo. F/u on LE dopplers. Will likely need IVC filter due to hx of GI bleeding. Will need to carefully discuss long term anticoagulation options given hx of GI bleeding.  CARDIOVASCULAR A:  Hypotension - likely due to bilateral PE Hx HTN P:  Echo.  RENAL A:   Hyperkalemia AKI P:   10u insulin, 1amp D50.  NS @  100. BMP in AM.  GASTROINTESTINAL A:   Nutrition Hx GI bleeding due to diverticulosis - required embolization in Dec 2015, then bled again Feb 2016 requiring transfusion P:   NPO for now.  HEMATOLOGIC A:   Anemia - chronic VTE Prophylaxis P:  Transfuse per usual ICU guidelines. Heparin gtt. CBC in AM.  INFECTIOUS A:   No indication of infection P:   Monitor clinically.  ENDOCRINE A:   DM P:   SSI. Hold  outpatient metformin.  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  RHEUMATOLOGIC A: Hx Sjogrens P: Continue outpatient azathioprine, prednisone.   Family updated: None.  Interdisciplinary Family Meeting v Palliative Care Meeting:  Due by: 04/23/15.   Rutherford Guys, [place] [facility] [place] [facility] Pager: [phone]  or [phone] 04/17/2015, 1:29 AM

## 2015-04-18 ENCOUNTER — Inpatient Hospital Stay (HOSPITAL_COMMUNITY): Payer: Medicare (Managed Care)

## 2015-04-18 DIAGNOSIS — N179 Acute kidney failure, unspecified: Secondary | ICD-10-CM

## 2015-04-18 DIAGNOSIS — I2699 Other pulmonary embolism without acute cor pulmonale: Secondary | ICD-10-CM

## 2015-04-18 LAB — CBC
HCT: 29.6 % — ABNORMAL LOW (ref 36.0–46.0)
HEMATOCRIT: 28.6 % — AB (ref 36.0–46.0)
Hemoglobin: 9 g/dL — ABNORMAL LOW (ref 12.0–15.0)
Hemoglobin: 8.5 g/dL — ABNORMAL LOW (ref 12.0–15.0)
MCH: 25 pg — ABNORMAL LOW (ref 26.0–34.0)
MCH: 24.4 pg — ABNORMAL LOW (ref 26.0–34.0)
MCHC: 30.4 g/dL (ref 30.0–36.0)
MCHC: 29.7 g/dL — ABNORMAL LOW (ref 30.0–36.0)
MCV: 82.2 fL (ref 78.0–100.0)
MCV: 82.2 fL (ref 78.0–100.0)
PLATELETS: 174 10*3/uL (ref 150–400)
Platelets: 187 10*3/uL (ref 150–400)
RBC: 3.6 MIL/uL — ABNORMAL LOW (ref 3.87–5.11)
RBC: 3.48 MIL/uL — AB (ref 3.87–5.11)
RDW: 17.6 % — ABNORMAL HIGH (ref 11.5–15.5)
RDW: 17.8 % — ABNORMAL HIGH (ref 11.5–15.5)
WBC: 6.2 10*3/uL (ref 4.0–10.5)
WBC: 5.9 10*3/uL (ref 4.0–10.5)

## 2015-04-18 LAB — BASIC METABOLIC PANEL
ANION GAP: 10 (ref 5–15)
BUN: 13 mg/dL (ref 6–20)
CALCIUM: 7.9 mg/dL — AB (ref 8.9–10.3)
CO2: 20 mmol/L — AB (ref 22–32)
CREATININE: 0.73 mg/dL (ref 0.44–1.00)
Chloride: 108 mmol/L (ref 101–111)
GFR calc Af Amer: >60 mL/min (ref >60)
GFR calc non Af Amer: >60 mL/min (ref >60)
GLUCOSE: 110 mg/dL — AB (ref 65–99)
Potassium: 4.1 mmol/L (ref 3.5–5.1)
SODIUM: 138 mmol/L (ref 135–145)

## 2015-04-18 LAB — GLUCOSE, CAPILLARY
GLUCOSE-CAPILLARY: 129 mg/dL — AB (ref 65–99)
Glucose-Capillary: 90 mg/dL (ref 65–99)
Glucose-Capillary: 152 mg/dL — ABNORMAL HIGH (ref 65–99)
Glucose-Capillary: 121 mg/dL — ABNORMAL HIGH (ref 65–99)
Glucose-Capillary: 186 mg/dL — ABNORMAL HIGH (ref 65–99)

## 2015-04-18 LAB — BRAIN NATRIURETIC PEPTIDE: B Natriuretic Peptide: 734.8 pg/mL — ABNORMAL HIGH (ref 0.0–100.0)

## 2015-04-18 LAB — HEPARIN LEVEL (UNFRACTIONATED)
Heparin Unfractionated: 0.38 IU/mL (ref 0.30–0.70)
Heparin Unfractionated: 0.42 IU/mL (ref 0.30–0.70)

## 2015-04-18 LAB — FIBRINOGEN: Fibrinogen: 444 mg/dL (ref 204–475)

## 2015-04-18 LAB — LACTIC ACID, PLASMA: LACTIC ACID, VENOUS: 1.8 mmol/L (ref 0.5–2.0)

## 2015-04-18 NOTE — Procedures (Signed)
Successful bilateral EKOS PE lysis No comp Stable Much improved respiratory status since yesterday.    LT PA: 59/28(40) RT PA: 55/29(38)  Full report in pacs

## 2015-04-18 NOTE — Progress Notes (Signed)
ANTICOAGULATION CONSULT NOTE - Follow Up Consult  Pharmacy Consult for heparin Indication: pulmonary embolus   Labs:  Recent Labs  04/16/15 2137 04/17/15 0228  04/17/15 0754 04/17/15 1600 04/17/15 1953 04/18/15 0413  HGB 10.6*  --   --  9.9* 7.8* 9.2* 9.0*  HCT 34.9*  --   --  33.5* 26.0* 30.6* 29.6*  PLT 226  --   --  231 189 204 174  APTT 38*  --   --   --   --   --   --   LABPROT 15.1*  --   --   --   --   --   --   INR 1.17  --   --   --   --   --   --   HEPARINUNFRC  --   --   < > 0.85* 0.30 0.34 0.38  CREATININE 1.62*  --   --  1.54*  --   --   --   TROPONINI 0.12* 0.16*  --  0.18*  --   --   --   < > = values in this interval not displayed.   Assessment/Plan:  64yo female remains therapeutic on heparin after rate change; RN reports steady bleeding at sheath site, soaking dressings and towels, MD notified and RNs instructed to hold pressure until back to IR today; Hgb low but fairly stable since last CBC. Will continue gtt at current rate and monitor closely.   Vernard GamblesVeronda Donovan Persley, PharmD, BCPS  04/18/2015,4:46 AM

## 2015-04-18 NOTE — Progress Notes (Signed)
PULMONARY / CRITICAL CARE MEDICINE   Name: Susan Shepherd MRN: [id number] DOB: [birth year]    ADMISSION DATE:  04/17/2015 CONSULTATION DATE:  04/18/2015  REFERRING MD :  [facility] ED  CHIEF COMPLAINT:  SOB  INITIAL PRESENTATION:   Diagnosed with Submassive PE. Known Sjogren on imuran/prednisone, paraplegic, DM, overw. Hx of GI Bleeding in dec 2015 needing embolizaion and then rebleed FEb 2016 due to diveritcular disease; needed PRBC. She is DNR/DNI but full medical care. CT A with submassive PE. PCP is Jasmine December ? In [place], [place]. s/p EKOS by IR and has ongoing infusion 04/17/15    EVENTS CTA chest 6/2 >>> acute bilateral PE with CT evidence of right heart strain (RV / LV 2.4). 6/2 - transferred to [facility] ICU with bilateral PE for consideration EKOS. 04/17/15 - started EKOS. DVT + at their eval   SUBJECTIVE/OVERNIGHT/INTERVAL HX 04/18/15: s/p completion of EKOS just now. Will start IV heaprin in an hour. Feeling better. Duplex LE still peding. Denies complaints.  RN reports - no overnight events and patient/RN requesting change to regular diet    VITAL SIGNS: Temp:  [98 F (36.7 C)-99.1 F (37.3 C)] 98.4 F (36.9 C) (06/04 0740) Pulse Rate:  [29-114] 29 (06/04 0800) Resp:  [17-32] 17 (06/04 0800) BP: (82-148)/(26-111) 148/111 mmHg (06/04 0800) SpO2:  [91 %-100 %] 96 % (06/04 0800) HEMODYNAMICS:   VENTILATOR SETTINGS:   INTAKE / OUTPUT: Intake/Output      06/03 0701 - 06/04 0700 06/04 0701 - 06/05 0700   I.V. (mL/kg) 5116.9 (53.6) 425 (4.5)   Other 360 60   Total Intake(mL/kg) 5476.9 (57.4) 485 (5.1)   Urine (mL/kg/hr) 1500 (0.7) 125 (0.4)   Total Output 1500 125   Net +3976.9 +360          PHYSICAL EXAMINATION: General: Pleasant adult AA female, in NAD. Neuro: A&O x 3, non-focal.  HEENT: [place]/AT. PERRL, sclerae anicteric.  [place] in place. Cardiovascular: Tachy, regular, no M/R/G.  Lungs: Respirations even and unlabored.  CTA bilaterally, No W/R/R.  Abdomen: BS x 4, soft, NT/ND.   Musculoskeletal: Paralyzed from waist down.  No gross deformities, 2+ pitting edema bilaterally to knees. Warm RLE and Cool LLE (per patient this is "baseline") Skin: Intact, warm, no rashes.  LABS:  PULMONARY No results for input(s): PHART, PCO2ART, PO2ART, HCO3, TCO2, O2SAT in the last 168 hours.  Invalid input(s): PCO2, PO2  CBC  Recent Labs Lab 04/17/15 1600 04/17/15 1953 04/18/15 0413  HGB 7.8* 9.2* 9.0*  HCT 26.0* 30.6* 29.6*  WBC 6.1 6.4 6.2  PLT 189 204 174    COAGULATION  Recent Labs Lab 04/16/15 2137  INR 1.17    CARDIAC   Recent Labs Lab 04/16/15 2137 04/17/15 0228 04/17/15 0754  TROPONINI 0.12* 0.16* 0.18*   No results for input(s): PROBNP in the last 168 hours.   CHEMISTRY  Recent Labs Lab 04/16/15 2137 04/17/15 0754  NA 138 137  K 5.5* 4.6  CL 104 107  CO2 21* 19*  GLUCOSE 153* 87  BUN 26* 26*  CREATININE 1.62* 1.54*  CALCIUM 9.1 8.0*  MG  --  1.9  PHOS  --  5.5*   Estimated Creatinine Clearance: 41.1 mL/min (by C-G formula based on Cr of 1.54).   LIVER  Recent Labs Lab 04/16/15 2137  AST 71*  ALT 35  ALKPHOS 62  BILITOT 0.8  PROT 6.7  ALBUMIN 3.3*  INR 1.17     INFECTIOUS  Recent Labs Lab 04/18/15 0414  LATICACIDVEN  1.8     ENDOCRINE CBG (last 3)   Recent Labs  04/17/15 1534 04/17/15 2017 04/18/15 0741  GLUCAP 106* 129* 90         IMAGING x48h  - iCt Angio Chest Pe W/cm &/or Wo Cm  04/16/2015   CLINICAL DATA:  Respiratory distress. Two day history of weakness and difficulty breathing.  EXAM: CT ANGIOGRAPHY CHEST WITH CONTRAST  TECHNIQUE: Multidetector CT imaging of the chest was performed using the standard protocol during bolus administration of intravenous contrast. Multiplanar CT image reconstructions and MIPs were obtained to evaluate the vascular anatomy.  CONTRAST:  80mL OMNIPAQUE IOHEXOL 350 MG/ML SOLN  COMPARISON:  Chest CT 07/28/2011  FINDINGS: Examination is positive for extensive  bilateral pulmonary emboli. There is filling defects within the right and left main pulmonary arteries extending into all lobar branches. Greatest thromboembolic burden is in the right middle lobe.  There is marked right heart strain, RV to LV ratio of 2.4. Contrast refluxes into the IVC and hepatic veins.  No definite pulmonary infarct. Linear opacities in the anterior left upper, right and left lower lobes consistent atelectasis. There are faint perihilar ground-glass opacities in the right upper lobe.  There is no pleural or pericardial effusion. The thoracic aorta is normal in caliber. No mediastinal or hilar adenopathy. Minimal soft tissue density in the anterior mediastinal is of uncertain significance.  No definite acute abnormality in the upper abdomen. There are no acute or suspicious osseous abnormalities.  Review of the MIP images confirms the above findings.  IMPRESSION: Positive for acute PE with CT evidence of right heart strain (RV/LV Ratio = 2.4) consistent with at least submassive (intermediate risk) PE. The presence of right heart strain has been associated with an increased risk of morbidity and mortality. Please activate Code PE by paging [phone].  Critical Value/emergent results were called by telephone at the time of interpretation on 04/16/2015 at 11:34 pm to Dr. Gladstone Pih , who verbally acknowledged these results.   Electronically Signed   By: Rubye Oaks M.D.   On: 04/16/2015 23:40   Ir Rande Lawman F/u Eval Art/ven Final Day (ms)  04/18/2015   CLINICAL DATA:  Acute bilateral pulmonary emboli, right heart strain, hypoxia respiratory failure. 12 hours status post ultrasound assisted PE lysis  EXAM: EKOS ULTRASOUND ASSISTED PE THROMBOLYSIS FOLLOW-UP (FINAL DAY)  Date:  6/4/20166/02/2015 8:58 am  Radiologist:  Judie Petit. Ruel Favors, MD  Guidance:  Fluoroscopic  FLUOROSCOPY TIME:  6 seconds  MEDICATIONS AND MEDICAL HISTORY: None.  ANESTHESIA/SEDATION: None.  CONTRAST:  None.  COMPLICATIONS:  None immediate  PROCEDURE: Informed consent was obtained from the patient following explanation of the procedure, risks, benefits and alternatives. The patient understands, agrees and consents for the procedure. All questions were addressed. A time out was performed.  Under sterile conditions, pressure measurements were obtained through the existing infusion catheters following removal of the inner ultrasound assisted infusion wires.  Left PA pressure:  59/28 (40)  Right PA pressure:  55/29 (38)  Images obtained for documentation. Infusion catheters and right common femoral vein sheaths removed. Hemostasis obtained with compression. No immediate complication. Patient tolerated the procedure well.  IMPRESSION: Successful completion of the 12 hour EKOS ultrasound assisted PE lysis. Clinically, the patient's respiratory status is much improved. Pulmonary arterial pressures remained elevated as above.   Electronically Signed   By: Judie Petit.  Shick M.D.   On: 04/18/2015 09:54        ASSESSMENT / PLAN:  PULMONARY A: Acute hypoxic  respiratory failure due to bilateral PE with CT evidence of right heart strain (RV / LV 2.4)   - 04/18/15: Not in distress  Pulse ox 99% on RA x  5 minutes. S.p completion of 24 EKOS lytic.  P:   Continue supplemental O2 as needed to maintain SpO2 > 92%. IV heparin in 1h per protocol F/u on echo. F/u on LE dopplers. wil Try to avoid IVC filter given VERY HIGH RISK for DVT syndrome.  Will need to carefully discuss long term anticoagulation options given hx of GI bleeding. - might do short term NOAC and then long term low dose  - might need heme consult to discuss pro/con decidsion making  CARDIOVASCULAR A:  Hypotension - likely due to bilateral PE Hx HTN Trop leak - likely due to pe   - no acute issue.  P:  Echo.- await reslts  RENAL   A:   AKI - improved 04/17/15  P:    NS @ 100. -> reduce to 50cc.h BMP now to reassess creat  GASTROINTESTINAL A:   Nutrition Hx  GI bleeding due to diverticulosis - required embolization in Dec 2015, then bled again Feb 2016 requiring transfusion   - so far no active bleeding  P:   Regular diet  HEMATOLOGIC A:   Anemia - chronic  P:  Transfuse per usual ICU guidelines. CBC in AM.  INFECTIOUS A:   No indication of infection P:   Monitor clinically.  ENDOCRINE A:   DM P:   SSI. Hold outpatient metformin.  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  RHEUMATOLOGIC A: Hx Sjogrens P: Continue outpatient azathioprine, prednisone.   Family updated: patient and duagyhter updated 04/17/15 at bedside. Patient again 04/18/15.   Interdisciplinary Family Meeting v Palliative Care Meeting:  Due by: 04/23/15.   GLOBAL If stable by 4pm move to SDU     Dr. Kalman ShanMurali Sherlyne Crownover, M.D., [facility] Memorial HospitalF.C.C.P Pulmonary and Critical Care Medicine Staff Physician  System [facility] Pager: [phone], If no answer or between  15:00h - 7:00h: call [phone]  04/18/2015 10:15 AM

## 2015-04-18 NOTE — Progress Notes (Signed)
ANTICOAGULATION CONSULT NOTE - Follow-up Consult  Pharmacy Consult for Heparin Indication: pulmonary embolus  Allergies  Allergen Reactions  . Bee Venom Anaphylaxis  . Tocilizumab     Patient Measurements: Height: 5\' 3"  (160 cm) Weight: 210 lb 8.6 oz (95.5 kg) IBW/kg (Calculated) : 52.4  Heparin Dosing Weight: 75 kg  Vital Signs: Temp: 98.4 F (36.9 C) (06/04 0740) Temp Source: Oral (06/04 0740) BP: 144/94 mmHg (06/04 1000) Pulse Rate: 99 (06/04 1000)  Labs:  Recent Labs  04/16/15 2137 04/17/15 0228  04/17/15 0754 04/17/15 1600 04/17/15 1953 04/18/15 0413  HGB 10.6*  --   --  9.9* 7.8* 9.2* 9.0*  HCT 34.9*  --   --  33.5* 26.0* 30.6* 29.6*  PLT 226  --   --  231 189 204 174  APTT 38*  --   --   --   --   --   --   LABPROT 15.1*  --   --   --   --   --   --   INR 1.17  --   --   --   --   --   --   HEPARINUNFRC  --   --   < > 0.85* 0.30 0.34 0.38  CREATININE 1.62*  --   --  1.54*  --   --   --   TROPONINI 0.12* 0.16*  --  0.18*  --   --   --   < > = values in this interval not displayed.  Estimated Creatinine Clearance: 41.1 mL/min (by C-G formula based on Cr of 1.54).  Assessment: 64 y.o. female presented to [place] with SOB and found to have extensive b/l PE on CT scan with R heart strain (RV/LV ratio 2.4). She is s/p successful catheter-directed lysis with EKOS system.  She experienced a lot of bleeding while on tPA overnight.  Her heparin is to restart at 1030 AM with no bolus per the IR orders.  Noted pt with admission for lower GIB 12/2014 which may complicate long-term anticoagulation decisions.  Goal of Therapy:  Heparin level 0.3-0.7 units/ml (aim for 0.5-0.7 with extensive clot) Monitor platelets by anticoagulation protocol: Yes   Plan:  Resume heparin drip at 1250 units/hr at 1030am. Next heparin level and CBC in 6 hours. Daily heparin level and CBC. Follow-up signs and symptoms of bleeding.  Estella HuskMichelle Kimari Coudriet, Pharm.D., BCPS, AAHIVP Clinical  Pharmacist Phone: [phone] or [phone] 04/18/2015, 10:34 AM

## 2015-04-18 NOTE — Progress Notes (Signed)
On call IR notified of pt's increased bleeding at sheath site. Instructed by on call IR to continue to reinforce dressing.

## 2015-04-18 NOTE — Progress Notes (Signed)
Pharmacy: Re-heparin  Patient's a 64 y.o F  s/p catheter-directed lysis with EKOS system for new PE with heparin resumed this morning after procedure.  Heparin level now back as 0.42.  No new bleeding documented.  Goal of Therapy:  Heparin level 0.3-0.7 units/ml (aim for 0.5-0.7 with extensive clot) Monitor platelets by anticoagulation protocol: Yes  Plan: - increase heparin drip to 1300 units/hr - recheck another level in 6 hours  Dorna LeitzAnh Geno Sydnor, PharmD, BCPS 04/18/2015 5:50 PM

## 2015-04-18 NOTE — Progress Notes (Signed)
  Echocardiogram 2D Echocardiogram has been performed.  Delcie RochENNINGTON, Dimple Bastyr 04/18/2015, 12:57 PM

## 2015-04-19 LAB — CBC WITH DIFFERENTIAL/PLATELET
BASOS ABS: 0 10*3/uL (ref 0.0–0.1)
Basophils Relative: 0 % (ref 0–1)
EOS PCT: 0 % (ref 0–5)
Eosinophils Absolute: 0 10*3/uL (ref 0.0–0.7)
HCT: 28.1 % — ABNORMAL LOW (ref 36.0–46.0)
Hemoglobin: 8.4 g/dL — ABNORMAL LOW (ref 12.0–15.0)
LYMPHS PCT: 17 % (ref 12–46)
Lymphs Abs: 1.1 10*3/uL (ref 0.7–4.0)
MCH: 24.6 pg — ABNORMAL LOW (ref 26.0–34.0)
MCHC: 29.9 g/dL — AB (ref 30.0–36.0)
MCV: 82.2 fL (ref 78.0–100.0)
Monocytes Absolute: 0.8 10*3/uL (ref 0.1–1.0)
Monocytes Relative: 13 % — ABNORMAL HIGH (ref 3–12)
NEUTROS ABS: 4.5 10*3/uL (ref 1.7–7.7)
NEUTROS PCT: 70 % (ref 43–77)
Platelets: 195 10*3/uL (ref 150–400)
RBC: 3.42 MIL/uL — ABNORMAL LOW (ref 3.87–5.11)
RDW: 17.8 % — AB (ref 11.5–15.5)
WBC: 6.5 10*3/uL (ref 4.0–10.5)

## 2015-04-19 LAB — HEPARIN LEVEL (UNFRACTIONATED)
HEPARIN UNFRACTIONATED: 0.58 [IU]/mL (ref 0.30–0.70)
HEPARIN UNFRACTIONATED: 0.44 [IU]/mL (ref 0.30–0.70)
HEPARIN UNFRACTIONATED: 0.42 [IU]/mL (ref 0.30–0.70)

## 2015-04-19 LAB — BASIC METABOLIC PANEL
Anion gap: 5 (ref 5–15)
BUN: 14 mg/dL (ref 6–20)
CHLORIDE: 113 mmol/L — AB (ref 101–111)
CO2: 22 mmol/L (ref 22–32)
Calcium: 8.1 mg/dL — ABNORMAL LOW (ref 8.9–10.3)
Creatinine, Ser: 0.65 mg/dL (ref 0.44–1.00)
Glucose, Bld: 135 mg/dL — ABNORMAL HIGH (ref 65–99)
Potassium: 4 mmol/L (ref 3.5–5.1)
SODIUM: 140 mmol/L (ref 135–145)

## 2015-04-19 LAB — GLUCOSE, CAPILLARY
GLUCOSE-CAPILLARY: 142 mg/dL — AB (ref 65–99)
Glucose-Capillary: 97 mg/dL (ref 65–99)
Glucose-Capillary: 182 mg/dL — ABNORMAL HIGH (ref 65–99)
Glucose-Capillary: 163 mg/dL — ABNORMAL HIGH (ref 65–99)

## 2015-04-19 LAB — PHOSPHORUS: Phosphorus: 2.1 mg/dL — ABNORMAL LOW (ref 2.5–4.6)

## 2015-04-19 LAB — MAGNESIUM: MAGNESIUM: 1.9 mg/dL (ref 1.7–2.4)

## 2015-04-19 MED ORDER — POTASSIUM PHOSPHATES 15 MMOLE/5ML IV SOLN
10.0000 mmol | Freq: Once | INTRAVENOUS | Status: AC
  Administered 2015-04-19: 10 mmol via INTRAVENOUS
  Filled 2015-04-19: qty 3.33

## 2015-04-19 MED ORDER — PREDNISONE 10 MG PO TABS
10.0000 mg | ORAL_TABLET | Freq: Every day | ORAL | Status: DC
  Administered 2015-04-20 – 2015-04-22 (×3): 10 mg via ORAL
  Filled 2015-04-19 (×4): qty 1

## 2015-04-19 MED ORDER — POTASSIUM CHLORIDE 20 MEQ/15ML (10%) PO SOLN: 40.0000 meq | ORAL | Status: DC

## 2015-04-19 MED ORDER — MIRABEGRON ER 50 MG PO TB24
50.0000 mg | ORAL_TABLET | Freq: Every day | ORAL | Status: DC
  Administered 2015-04-19 – 2015-04-21 (×3): 50 mg via ORAL
  Filled 2015-04-19 (×5): qty 1

## 2015-04-19 MED ORDER — MAGNESIUM SULFATE 2 GM/50ML IV SOLN
2.0000 g | Freq: Once | INTRAVENOUS | Status: AC
  Administered 2015-04-19: 2 g via INTRAVENOUS
  Filled 2015-04-19: qty 50

## 2015-04-19 NOTE — Progress Notes (Signed)
ANTICOAGULATION CONSULT NOTE - Follow Up Consult  Pharmacy Consult for heparin Indication: pulmonary embolus  Allergies  Allergen Reactions  . Bee Venom Anaphylaxis  . Tocilizumab     Patient Measurements: Height: 5\' 3"  (160 cm) Weight: 223 lb 1.7 oz (101.2 kg) (scale bed ) IBW/kg (Calculated) : 52.4 Heparin Dosing Weight: 75 kg  Vital Signs: Temp: 98.8 F (37.1 C) (06/05 1502) Temp Source: Oral (06/05 1502) BP: 130/99 mmHg (06/05 1502) Pulse Rate: 96 (06/05 1502)  Labs:  Recent Labs  04/16/15 2137 04/17/15 0228 04/17/15 0754  04/18/15 0413 04/18/15 1334 04/18/15 1440  04/19/15 0047 04/19/15 0215 04/19/15 0845 04/19/15 1650  HGB 10.6*  --  9.9*  < > 9.0*  --  8.5*  --   --  8.4*  --   --   HCT 34.9*  --  33.5*  < > 29.6*  --  28.6*  --   --  28.1*  --   --   PLT 226  --  231  < > 174  --  187  --   --  195  --   --   APTT 38*  --   --   --   --   --   --   --   --   --   --   --   LABPROT 15.1*  --   --   --   --   --   --   --   --   --   --   --   INR 1.17  --   --   --   --   --   --   --   --   --   --   --   HEPARINUNFRC  --   --  0.85*  < > 0.38  --   --   < > 0.42  --  0.58 0.44  CREATININE 1.62*  --  1.54*  --   --  0.73  --   --   --  0.65  --   --   TROPONINI 0.12* 0.16* 0.18*  --   --   --   --   --   --   --   --   --   < > = values in this interval not displayed.  Estimated Creatinine Clearance: 81.7 mL/min (by C-G formula based on Cr of 0.65).   Medications:  Scheduled:  . antiseptic oral rinse  7 mL Mouth Rinse BID  . azaTHIOprine  100 mg Oral BID  . baclofen  10 mg Oral TID  . calcium carbonate  1 tablet Oral Q breakfast  . cholecalciferol  1,000 Units Oral Daily  . darifenacin  7.5 mg Oral Daily  . ferrous sulfate  325 mg Oral Q breakfast  . gabapentin  400 mg Oral 2 times per day  . gabapentin  600 mg Oral QHS  . insulin aspart  0-15 Units Subcutaneous TID WC  . mirabegron ER  50 mg Oral QHS  . pantoprazole  40 mg Oral Daily  .  [START ON 04/20/2015] predniSONE  10 mg Oral Q breakfast  . tiZANidine  2 mg Oral TID  . vitamin C  500 mg Oral BID   Infusions:  . heparin 1,400 Units/hr (04/19/15 0201)    Assessment: 64 yo female with PE is currently on therapeutic heparin. Heparin level is 0.44 (even though it's therapeutic, wants to aim for 0.5-0.7) Goal of  Therapy:  Heparin level 0.3-0.7 units/ml;  (aim for 0.5-0.7 with extensive clot) Monitor platelets by anticoagulation protocol: Yes   Plan:  - increase heparin a little bit to 1450 units/hr - heparin level in am  Yohana Bartha, Tsz-Yin 04/19/2015,5:45 PM

## 2015-04-19 NOTE — Progress Notes (Signed)
Patient ID: Susan Shepherd, female   DOB: August 26, 1951, 64 y.o.   MRN: [id number]    Referring Physician(s): CCM  Subjective:  Pt doing well; denies CP,dyspnea, cough or abnormal bleeding; eating breakfast now  Allergies: Bee venom and Tocilizumab  Medications: Prior to Admission medications   Medication Sig Start Date End Date Taking? Authorizing Provider  acetaminophen (TYLENOL) 325 MG tablet Take 650 mg by mouth daily as needed.   Yes Historical Provider, MD  alendronate (FOSAMAX) 70 MG tablet Take 70 mg by mouth once a week. Saturday. Take with a full glass of water on an empty stomach.   Yes Historical Provider, MD  azaTHIOprine (IMURAN) 50 MG tablet Take 100 mg by mouth 2 (two) times daily.    Yes Historical Provider, MD  baclofen (LIORESAL) 10 MG tablet Take 10 mg by mouth 3 (three) times daily.    Yes Historical Provider, MD  calcium carbonate (OS-CAL - DOSED IN MG OF ELEMENTAL CALCIUM) 1250 (500 CA) MG tablet Take 1 tablet by mouth daily with breakfast.   Yes Historical Provider, MD  carvedilol (COREG) 3.125 MG tablet Take 3.125 mg by mouth 2 (two) times daily with a meal.   Yes Historical Provider, MD  cholecalciferol (VITAMIN D) 1000 UNITS tablet Take 1,000 Units by mouth daily.   Yes Historical Provider, MD  ciprofloxacin (CIPRO) 500 MG tablet Take 500 mg by mouth 2 (two) times daily. For 10 days.   Yes Historical Provider, MD  EPINEPHrine 0.3 mg/0.3 mL IJ SOAJ injection Inject 0.3 mg into the muscle once.   Yes Historical Provider, MD  gabapentin (NEURONTIN) 300 MG capsule Take 600 mg by mouth at bedtime.   Yes Historical Provider, MD  gabapentin (NEURONTIN) 400 MG capsule Take 400 mg by mouth 2 (two) times daily. At 9:am and noon   Yes Historical Provider, MD  metFORMIN (GLUCOPHAGE) 500 MG tablet Take 500 mg by mouth daily with breakfast.    Yes Historical Provider, MD  mirabegron ER (MYRBETRIQ) 50 MG TB24 tablet Take 50 mg by mouth at bedtime.   Yes Historical Provider, MD   nystatin cream (MYCOSTATIN) Apply 1 application topically daily as needed for dry skin.    Yes Historical Provider, MD  omeprazole (PRILOSEC) 40 MG capsule Take 40 mg by mouth daily.   Yes Historical Provider, MD  predniSONE (DELTASONE) 10 MG tablet Take 10 mg by mouth daily with breakfast.    Yes Historical Provider, MD  senna (SENOKOT) 8.6 MG TABS tablet Take 2 tablets by mouth at bedtime as needed for mild constipation or moderate constipation.   Yes Historical Provider, MD  solifenacin (VESICARE) 10 MG tablet Take 10 mg by mouth daily.   Yes Historical Provider, MD  sulfamethoxazole-trimethoprim (BACTRIM,SEPTRA) 400-80 MG per tablet Take 1 tablet by mouth daily.   Yes Historical Provider, MD  tiZANidine (ZANAFLEX) 2 MG tablet Take 2 mg by mouth 3 (three) times daily. Take 6mg  at 8, and noon, then 4mg  at 6:pm   Yes Historical Provider, MD  vitamin C (ASCORBIC ACID) 500 MG tablet Take 500 mg by mouth 2 (two) times daily.   Yes Historical Provider, MD     Vital Signs: BP 148/103 mmHg  Pulse 102  Temp(Src) 98.5 F (36.9 C) (Oral)  Resp 23  Ht 5\' 3"  (1.6 m)  Wt 226 lb 6.6 oz (102.7 kg)  BMI 40.12 kg/m2  SpO2 92%  Physical Exam awake/alert; rt CFV puncture site clean, dry, soft, NT, no hematoma  Imaging: Ct Angio  Chest Pe W/cm &/or Wo Cm  04/16/2015   CLINICAL DATA:  Respiratory distress. Two day history of weakness and difficulty breathing.  EXAM: CT ANGIOGRAPHY CHEST WITH CONTRAST  TECHNIQUE: Multidetector CT imaging of the chest was performed using the standard protocol during bolus administration of intravenous contrast. Multiplanar CT image reconstructions and MIPs were obtained to evaluate the vascular anatomy.  CONTRAST:  80mL OMNIPAQUE IOHEXOL 350 MG/ML SOLN  COMPARISON:  Chest CT 07/28/2011  FINDINGS: Examination is positive for extensive bilateral pulmonary emboli. There is filling defects within the right and left main pulmonary arteries extending into all lobar branches. Greatest  thromboembolic burden is in the right middle lobe.  There is marked right heart strain, RV to LV ratio of 2.4. Contrast refluxes into the IVC and hepatic veins.  No definite pulmonary infarct. Linear opacities in the anterior left upper, right and left lower lobes consistent atelectasis. There are faint perihilar ground-glass opacities in the right upper lobe.  There is no pleural or pericardial effusion. The thoracic aorta is normal in caliber. No mediastinal or hilar adenopathy. Minimal soft tissue density in the anterior mediastinal is of uncertain significance.  No definite acute abnormality in the upper abdomen. There are no acute or suspicious osseous abnormalities.  Review of the MIP images confirms the above findings.  IMPRESSION: Positive for acute PE with CT evidence of right heart strain (RV/LV Ratio = 2.4) consistent with at least submassive (intermediate risk) PE. The presence of right heart strain has been associated with an increased risk of morbidity and mortality. Please activate Code PE by paging [phone].  Critical Value/emergent results were called by telephone at the time of interpretation on 04/16/2015 at 11:34 pm to Dr. Gladstone Pih , who verbally acknowledged these results.   Electronically Signed   By: Rubye Oaks M.D.   On: 04/16/2015 23:40   Ir Rande Lawman F/u Eval Art/ven Final Day (ms)  04/18/2015   CLINICAL DATA:  Acute bilateral pulmonary emboli, right heart strain, hypoxia respiratory failure. 12 hours status post ultrasound assisted PE lysis  EXAM: EKOS ULTRASOUND ASSISTED PE THROMBOLYSIS FOLLOW-UP (FINAL DAY)  Date:  6/4/20166/02/2015 8:58 am  Radiologist:  Judie Petit. Ruel Favors, MD  Guidance:  Fluoroscopic  FLUOROSCOPY TIME:  6 seconds  MEDICATIONS AND MEDICAL HISTORY: None.  ANESTHESIA/SEDATION: None.  CONTRAST:  None.  COMPLICATIONS: None immediate  PROCEDURE: Informed consent was obtained from the patient following explanation of the procedure, risks, benefits and alternatives.  The patient understands, agrees and consents for the procedure. All questions were addressed. A time out was performed.  Under sterile conditions, pressure measurements were obtained through the existing infusion catheters following removal of the inner ultrasound assisted infusion wires.  Left PA pressure:  59/28 (40)  Right PA pressure:  55/29 (38)  Images obtained for documentation. Infusion catheters and right common femoral vein sheaths removed. Hemostasis obtained with compression. No immediate complication. Patient tolerated the procedure well.  IMPRESSION: Successful completion of the 12 hour EKOS ultrasound assisted PE lysis. Clinically, the patient's respiratory status is much improved. Pulmonary arterial pressures remained elevated as above.   Electronically Signed   By: Judie Petit.  Shick M.D.   On: 04/18/2015 09:54    Labs:  CBC:  Recent Labs  04/17/15 1953 04/18/15 0413 04/18/15 1440 04/19/15 0215  WBC 6.4 6.2 5.9 6.5  HGB 9.2* 9.0* 8.5* 8.4*  HCT 30.6* 29.6* 28.6* 28.1*  PLT 204 174 187 195    COAGS:  Recent Labs  10/17/14 1437 12/12/14 0435  01/09/15 1229 04/16/15 2137  INR 1.0 1.0 1.15 1.17  APTT  --  23.0* 28 38*    BMP:  Recent Labs  04/16/15 2137 04/17/15 0754 04/18/15 1334 04/19/15 0215  NA 138 137 138 140  K 5.5* 4.6 4.1 4.0  CL 104 107 108 113*  CO2 21* 19* 20* 22  GLUCOSE 153* 87 110* 135*  BUN 26* 26* 13 14  CALCIUM 9.1 8.0* 7.9* 8.1*  CREATININE 1.62* 1.54* 0.73 0.65  GFRNONAA 33* 35* >60 >60  GFRAA 38* 40* >60 >60    LIVER FUNCTION TESTS:  Recent Labs  01/09/15 0014 01/09/15 1229 01/11/15 0302 04/16/15 2137  BILITOT 0.7 1.0 0.5 0.8  AST 71*  ALT 35  ALKPHOS 37* 39 35* 62  PROT 3.8* 4.7* 4.4* 6.7  ALBUMIN 2.2* 2.6* 2.4* 3.3*    Assessment and Plan:  S/p completion of EKOS [place] assisted PE lysis 6/4; pt currently stable; hgb 8.4 plts 195k, creat .65; IV heparin / f/u LE venous dopplers/consider heme consult as per  CCM  Signed: D. Jeananne Rama 04/19/2015, 9:18 AM   I spent a total of 15 minutes in face to face in clinical consultation/evaluation, greater than 50% of which was counseling/coordinating care for PE lysis

## 2015-04-19 NOTE — Progress Notes (Signed)
ANTICOAGULATION CONSULT NOTE - Follow-up Consult  Pharmacy Consult for Heparin Indication: pulmonary embolus  Allergies  Allergen Reactions  . Bee Venom Anaphylaxis  . Tocilizumab     Patient Measurements: Height: 5\' 3"  (160 cm) Weight: 226 lb 6.6 oz (102.7 kg) IBW/kg (Calculated) : 52.4  Heparin Dosing Weight: 75 kg  Vital Signs: Temp: 98.5 F (36.9 C) (06/05 0740) Temp Source: Oral (06/05 0740) BP: 135/94 mmHg (06/05 0900) Pulse Rate: 111 (06/05 0900)  Labs:  Recent Labs  04/16/15 2137 04/17/15 0228 04/17/15 0754  04/18/15 0413 04/18/15 1334 04/18/15 1440 04/18/15 1645 04/19/15 0047 04/19/15 0215 04/19/15 0845  HGB 10.6*  --  9.9*  < > 9.0*  --  8.5*  --   --  8.4*  --   HCT 34.9*  --  33.5*  < > 29.6*  --  28.6*  --   --  28.1*  --   PLT 226  --  231  < > 174  --  187  --   --  195  --   APTT 38*  --   --   --   --   --   --   --   --   --   --   LABPROT 15.1*  --   --   --   --   --   --   --   --   --   --   INR 1.17  --   --   --   --   --   --   --   --   --   --   HEPARINUNFRC  --   --  0.85*  < > 0.38  --   --  0.42 0.42  --  0.58  CREATININE 1.62*  --  1.54*  --   --  0.73  --   --   --  0.65  --   TROPONINI 0.12* 0.16* 0.18*  --   --   --   --   --   --   --   --   < > = values in this interval not displayed.  Estimated Creatinine Clearance: 82.4 mL/min (by C-G formula based on Cr of 0.65).  Assessment: 64 y.o. female presented to [facility] on 04/17/2015 with SOB and found to have extensive b/l PE on CT scan with R heart strain (RV/LV ratio 2.4). She is s/p successful catheter-directed lysis with EKOS system.  She experienced a lot of bleeding while on tPA which has now resolved.  HL this am is now therapeutic at 0.58 after rate increase. H/H remains low but stable, plt wnl and stable.   **Noted pt with admission for lower GIB 12/2014 which may complicate long-term anticoagulation decisions.  Goal of Therapy:  Heparin level 0.3-0.7 units/ml (aim for  0.5-0.7 with extensive clot) Monitor platelets by anticoagulation protocol: Yes   Plan:  Continue heparin gtt at 1400 u/hr Next heparin level in 6 hours to confirm Daily heparin level and CBC Follow-up signs and symptoms of bleeding  Terron Merfeld K. Bonnye FavaNicolsen, PharmD, BCPS Clinical Pharmacist - Resident Pager: [phone] Pharmacy: [phone] 04/19/2015 10:55 AM

## 2015-04-19 NOTE — Progress Notes (Signed)
Attempted to call report to nurse. Awaiting call back. 

## 2015-04-19 NOTE — Progress Notes (Signed)
Report called to nurse, Toma CopierBethany. Patient is A/O, vitals stable, belongings packed and ready for transfer.

## 2015-04-19 NOTE — Progress Notes (Addendum)
PULMONARY / CRITICAL CARE MEDICINE   Name: Susan Shepherd MRN: [id number] DOB: 03-02-1951    ADMISSION DATE:  04/17/2015 CONSULTATION DATE:  04/19/2015  REFERRING MD :  [facility] ED  CHIEF COMPLAINT:  SOB  INITIAL PRESENTATION:   Diagnosed with Submassive PE. Known Sjogren on imuran/prednisone, paraplegic, DM, overw. Hx of GI Bleeding in dec 2015 needing embolizaion and then rebleed FEb 2016 due to diveritcular disease; needed PRBC. She is DNR/DNI but full medical care. CT A with submassive PE. PCP is Jasmine December ? In [place], [place]. s/p EKOS by IR and has ongoing infusion 04/17/15    EVENTS CTA chest 6/2 >>> acute bilateral PE with CT evidence of right heart strain (RV / LV 2.4). 6/2 - transferred to [facility] ICU with bilateral PE for consideration EKOS. 04/17/15 - started EKOS. DVT + at their eval   04/18/15: s/p completion of EKOS just now. Will start IV heaprin in an hour. Feeling better. Duplex LE still peding. Denies complaints.  RN reports - no overnight events and patient/RN requesting change to regular diet   SUBJECTIVE/OVERNIGHT/INTERVAL HX 04/19/15:  Bedside hx with RN:   On IV hpearin now. 93% pulse ox on RA. Doing well. ECHO yesterday - PASP 68 (cpompared to 48 in feb 2016). RV mildly dilated and  RVEF reduced. LVEF 60%. Duplex LE pending. She is eating well and denies complaints.  RN also says IR has not yet removed sheath   VITAL SIGNS: Temp:  [97.7 F (36.5 C)-98.5 F (36.9 C)] 98.5 F (36.9 C) (06/05 0740) Pulse Rate:  [92-105] 97 (06/05 0600) Resp:  [17-25] 17 (06/05 0600) BP: (92-144)/(70-104) 144/92 mmHg (06/05 0600) SpO2:  [91 %-100 %] 95 % (06/05 0600) Weight:  [102.7 kg (226 lb 6.6 oz)] 102.7 kg (226 lb 6.6 oz) (06/05 0500) HEMODYNAMICS:   VENTILATOR SETTINGS:   INTAKE / OUTPUT: Intake/Output      06/04 0701 - 06/05 0700 06/05 0701 - 06/06 0700   I.V. (mL/kg) 1812.4 (17.6)    Other 60    Total Intake(mL/kg) 1872.4 (18.2)    Urine (mL/kg/hr) 1125 (0.5)    Total  Output 1125     Net +747.4            PHYSICAL EXAMINATION: General: Pleasant adult AA female, in NAD. Neuro: A&O x 3, non-focal.  HEENT: [place]/AT. PERRL, sclerae anicteric.  [place] in place. Cardiovascular: Tachy, regular, no M/R/G.  Lungs: Respirations even and unlabored.  CTA bilaterally, No W/R/R.  Abdomen: BS x 4, soft, NT/ND.  Musculoskeletal: Paralyzed from waist down.  No gross deformities, 2+ pitting edema bilaterally to knees. Both RLE and LLE  Are equally warm (per patient normall LLE is cooler like yesterday "baseline") Skin: Intact, warm, no rashes.  LABS:  PULMONARY No results for input(s): PHART, PCO2ART, PO2ART, HCO3, TCO2, O2SAT in the last 168 hours.  Invalid input(s): PCO2, PO2  CBC  Recent Labs Lab 04/18/15 0413 04/18/15 1440 04/19/15 0215  HGB 9.0* 8.5* 8.4*  HCT 29.6* 28.6* 28.1*  WBC 6.2 5.9 6.5  PLT 174 187 195    COAGULATION  Recent Labs Lab 04/16/15 2137  INR 1.17    CARDIAC    Recent Labs Lab 04/16/15 2137 04/17/15 0228 04/17/15 0754  TROPONINI 0.12* 0.16* 0.18*   No results for input(s): PROBNP in the last 168 hours.   CHEMISTRY  Recent Labs Lab 04/16/15 2137 04/17/15 0754 04/18/15 1334 04/19/15 0215  NA 138 137 138 140  K 5.5* 4.6 4.1 4.0  CL 104  107 108 113*  CO2 21* 19* 20* 22  GLUCOSE 153* 87 110* 135*  BUN 26* 26* 13 14  CREATININE 1.62* 1.54* 0.73 0.65  CALCIUM 9.1 8.0* 7.9* 8.1*  MG  --  1.9  --  1.9  PHOS  --  5.5*  --  2.1*   Estimated Creatinine Clearance: 82.4 mL/min (by C-G formula based on Cr of 0.65).   LIVER  Recent Labs Lab 04/16/15 2137  AST 71*  ALT 35  ALKPHOS 62  BILITOT 0.8  PROT 6.7  ALBUMIN 3.3*  INR 1.17     INFECTIOUS  Recent Labs Lab 04/18/15 0414  LATICACIDVEN 1.8     ENDOCRINE CBG (last 3)   Recent Labs  04/18/15 1221 04/18/15 1810 04/18/15 2219  GLUCAP 152* 121* 186*         IMAGING x48h  - iIr Thromb F/u Eval Art/ven Final Day (ms)  04/18/2015    CLINICAL DATA:  Acute bilateral pulmonary emboli, right heart strain, hypoxia respiratory failure. 12 hours status post ultrasound assisted PE lysis  EXAM: EKOS ULTRASOUND ASSISTED PE THROMBOLYSIS FOLLOW-UP (FINAL DAY)  Date:  6/4/20166/02/2015 8:58 am  Radiologist:  Judie Petit. Ruel Favors, MD  Guidance:  Fluoroscopic  FLUOROSCOPY TIME:  6 seconds  MEDICATIONS AND MEDICAL HISTORY: None.  ANESTHESIA/SEDATION: None.  CONTRAST:  None.  COMPLICATIONS: None immediate  PROCEDURE: Informed consent was obtained from the patient following explanation of the procedure, risks, benefits and alternatives. The patient understands, agrees and consents for the procedure. All questions were addressed. A time out was performed.  Under sterile conditions, pressure measurements were obtained through the existing infusion catheters following removal of the inner ultrasound assisted infusion wires.  Left PA pressure:  59/28 (40)  Right PA pressure:  55/29 (38)  Images obtained for documentation. Infusion catheters and right common femoral vein sheaths removed. Hemostasis obtained with compression. No immediate complication. Patient tolerated the procedure well.  IMPRESSION: Successful completion of the 12 hour EKOS ultrasound assisted PE lysis. Clinically, the patient's respiratory status is much improved. Pulmonary arterial pressures remained elevated as above.   Electronically Signed   By: Judie Petit.  Shick M.D.   On: 04/18/2015 09:54        ASSESSMENT / PLAN:  PULMONARY A: Acute hypoxic respiratory failure due to bilateral PE with CT evidence of right heart strain (RV / LV 2.4) - she has submassive / massive PE -had transient hypotension at admit   - 04/19/15: Not in distress  Pulse ox 92% on RA. S.p completion of EKOS lytic on 04/18/15 AM P:    O2 as needed to maintain SpO2 > 92%. IV heparin per protocol F/u on LE dopplers; reordered 04/19/2015 wil Try to avoid IVC filter given VERY HIGH RISK for DVT syndrome.  Long discussion on  anticogulation risks, benefits, limitations v IVC filter ad no anticoagualtion  - she is at VERY VERY HIGH RISK of DVT/PE next several months or even longer (very thrombogenic due to massive PE now in setting of chronic immobility). Any IVC filter now will put her at very high risk for DVT syndrome. She does not want DVT syndrome (still does some transfers with PT). She would rather accept risk of bleed and take anticogulation but does want team to discuss this with GI at [facility] and her PCP   - Discussed NOAC v coumadin if she opted for anticogulation - she is contemplating   - - might need heme consult to discuss pro/con decidsion making complete - Dr  GRanfortunal  CARDIOVASCULAR A:  #BAseline Hx HTN  #current Trop leak - likely due to pe   - no acute issue.  ECHO with RV strain post EKOS on 04/18/15 - Pulmonary Hypertension P:  Echo.might need opd fu - Dr Kendrick FriesMcQuaid  RENAL   A:   AKI - improved 04/17/15, resolved 04/19/15  Mild hypomagnesemia (<2) Mild hypophostemia    P:   Saline lock Mag and phos repletion BMP now to reassess creat  GASTROINTESTINAL A:   Nutrition Hx GI bleeding due to diverticulosis - required embolization in Dec 2015, then bled again Feb 2016 requiring transfusion   - so far no active bleeding  P:   Regular diet Will [facility] to ensure ok for anticoagulation at dc  HEMATOLOGIC A:   Anemia - chronic  P:  Transfuse per usual ICU guidelines. CBC in AM.  INFECTIOUS A:   No indication of infection P:   Monitor clinically.  ENDOCRINE A:   DM P:   SSI. Hold outpatient metformin.  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  RHEUMATOLOGIC A: Hx Sjogrens P: Continue outpatient azathioprine, prednisone. - immunisuppressants   High risk toxic medications Monitior cell count periodically with  this   Family updated: patient and duagyhter updated 04/17/15 at bedside. Patient again 04/18/15. And 04/19/15  Interdisciplinary Family Meeting v Palliative Care Meeting:  Due by: 04/23/15.   GLOBAL Move to tele. STays on PCCM.     Dr. Kalman ShanMurali Kristalynn Coddington, M.D., [facility].P Pulmonary and Critical Care Medicine Staff Physician [facility] Pulmonary and Critical Care Pager: [phone], If no answer or between  15:00h - 7:00h: call [phone]  04/19/2015 8:02 AM

## 2015-04-19 NOTE — Progress Notes (Signed)
Pharmacy: Heparin  Patient's a 64 y.o F  s/p catheter-directed lysis with EKOS system for new PE with heparin resumed this morning after procedure.  Heparin level remains thereapeutic at 0.42 on 1300 units/hr - aiming for higher end of goal with extensive clot. No new bleeding documented.  Goal of Therapy:  Heparin level 0.3-0.7 units/ml (aim for 0.5-0.7 with extensive clot) Monitor platelets by anticoagulation protocol: Yes  Plan: - Increase heparin drip to 1400 units/hr - F/u heparin level in 6 hours  Christoper Fabianaron Abri Vacca, PharmD, BCPS Clinical pharmacist, pager [phone] 04/19/2015 1:59 AM

## 2015-04-20 LAB — BASIC METABOLIC PANEL
Anion gap: 7 (ref 5–15)
BUN: 11 mg/dL (ref 6–20)
CO2: 22 mmol/L (ref 22–32)
Calcium: 8.5 mg/dL — ABNORMAL LOW (ref 8.9–10.3)
Chloride: 111 mmol/L (ref 101–111)
Creatinine, Ser: 0.73 mg/dL (ref 0.44–1.00)
GFR calc non Af Amer: >60 mL/min (ref >60)
Glucose, Bld: 126 mg/dL — ABNORMAL HIGH (ref 65–99)
Potassium: 4.1 mmol/L (ref 3.5–5.1)
Sodium: 140 mmol/L (ref 135–145)

## 2015-04-20 LAB — CBC WITH DIFFERENTIAL/PLATELET
BASOS PCT: 0 % (ref 0–1)
Basophils Absolute: 0 10*3/uL (ref 0.0–0.1)
EOS PCT: 1 % (ref 0–5)
Eosinophils Absolute: 0.1 10*3/uL (ref 0.0–0.7)
HEMATOCRIT: 27.7 % — AB (ref 36.0–46.0)
Hemoglobin: 8.2 g/dL — ABNORMAL LOW (ref 12.0–15.0)
LYMPHS ABS: 1.4 10*3/uL (ref 0.7–4.0)
Lymphocytes Relative: 23 % (ref 12–46)
MCH: 24.5 pg — ABNORMAL LOW (ref 26.0–34.0)
MCHC: 29.6 g/dL — ABNORMAL LOW (ref 30.0–36.0)
MCV: 82.7 fL (ref 78.0–100.0)
MONO ABS: 0.7 10*3/uL (ref 0.1–1.0)
MONOS PCT: 11 % (ref 3–12)
NEUTROS PCT: 65 % (ref 43–77)
Neutro Abs: 4.1 10*3/uL (ref 1.7–7.7)
Platelets: 203 10*3/uL (ref 150–400)
RBC: 3.35 MIL/uL — ABNORMAL LOW (ref 3.87–5.11)
RDW: 18 % — ABNORMAL HIGH (ref 11.5–15.5)
WBC: 6.2 10*3/uL (ref 4.0–10.5)

## 2015-04-20 LAB — PHOSPHORUS: Phosphorus: 2.2 mg/dL — ABNORMAL LOW (ref 2.5–4.6)

## 2015-04-20 LAB — MAGNESIUM: Magnesium: 1.9 mg/dL (ref 1.7–2.4)

## 2015-04-20 LAB — GLUCOSE, CAPILLARY
GLUCOSE-CAPILLARY: 129 mg/dL — AB (ref 65–99)
Glucose-Capillary: 123 mg/dL — ABNORMAL HIGH (ref 65–99)
Glucose-Capillary: 136 mg/dL — ABNORMAL HIGH (ref 65–99)
Glucose-Capillary: 110 mg/dL — ABNORMAL HIGH (ref 65–99)

## 2015-04-20 LAB — HEPARIN LEVEL (UNFRACTIONATED): Heparin Unfractionated: 0.7 IU/mL (ref 0.30–0.70)

## 2015-04-20 MED ORDER — COUMADIN BOOK
Freq: Once | Status: AC
  Administered 2015-04-20: 1
  Filled 2015-04-20: qty 1

## 2015-04-20 MED ORDER — WARFARIN VIDEO
Freq: Once | Status: DC
Start: 2015-04-20 — End: 2015-04-22

## 2015-04-20 MED ORDER — WARFARIN SODIUM 7.5 MG PO TABS
7.5000 mg | ORAL_TABLET | Freq: Once | ORAL | Status: AC
  Administered 2015-04-20: 7.5 mg via ORAL
  Filled 2015-04-20: qty 1

## 2015-04-20 MED ORDER — MAGNESIUM SULFATE IN D5W 10-5 MG/ML-% IV SOLN
1.0000 g | Freq: Once | INTRAVENOUS | Status: AC
  Administered 2015-04-20: 1 g via INTRAVENOUS
  Filled 2015-04-20: qty 100

## 2015-04-20 MED ORDER — WARFARIN - PHARMACIST DOSING INPATIENT
Freq: Every day | Status: DC
  Administered 2015-04-20 – 2015-04-21 (×2)

## 2015-04-20 MED ORDER — POTASSIUM PHOSPHATES 15 MMOLE/5ML IV SOLN
10.0000 mmol | Freq: Once | INTRAVENOUS | Status: AC
  Administered 2015-04-20: 10 mmol via INTRAVENOUS
  Filled 2015-04-20: qty 3.33

## 2015-04-20 NOTE — Progress Notes (Addendum)
ANTICOAGULATION CONSULT NOTE - Follow Up Consult  Pharmacy Consult for Heparin + new Coumadin Indication: PE/DVT  Allergies  Allergen Reactions  . Bee Venom Anaphylaxis  . Tocilizumab     Patient Measurements: Height: 5\' 3"  (160 cm) Weight: 226 lb 3.1 oz (102.6 kg) IBW/kg (Calculated) : 52.4 Heparin Dosing Weight: 75 kg  Vital Signs: Temp: 99.5 F (37.5 C) (06/06 0930) Temp Source: Oral (06/06 0500) BP: 127/85 mmHg (06/06 0930) Pulse Rate: 107 (06/06 0930)  Labs:  Recent Labs  04/18/15 1334 04/18/15 1440  04/19/15 0215 04/19/15 0845 04/19/15 1650 04/20/15 0317  HGB  --  8.5*  --  8.4*  --   --  8.2*  HCT  --  28.6*  --  28.1*  --   --  27.7*  PLT  --  187  --  195  --   --  203  HEPARINUNFRC  --   --   < >  --  0.58 0.44 0.70  CREATININE 0.73  --   --  0.65  --   --  0.73  < > = values in this interval not displayed.  Estimated Creatinine Clearance: 82.4 mL/min (by C-G formula based on Cr of 0.73).   Assessment: 64 y.o. female presents to [place] on 04/17/2015 with SOB. Found to have extensive b/l PE on CT scan with R heart strain. 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  AC: Heparin for extensive b/l PE with R heart strain (RV/LV 2.4). EKOS 6/3, successful and removed 6/4 AM. Had lots of bleeding while on EKOS, now resolved. +DVT. Heparin level 0.7 at high end of goal range. Hgb 8.2 low but stable. Plts stable. Baseline INR 1.17. Start Coumadin today in preparation for discharge. **Noted pt with GIB 12/15 and rebleed 12/2014 - CCM considering long-term coag options **Try to avoid IVC filter given VERY HIGH RISK for DVT syndrome.**  ID: Afeb, wbc wnl, MRSA pcr neg. Bactrim daily PTA  CV: HTN. BP ok but HR 107. EF 60%. No CV meds. (Coreg PTA)  Endo: CBGs 97-182 on SSI (on steroids); PTA Meds: metformin 500 daily  GI: AST mild elev,  otherwise wnl. PPI po  Neuro:  Paraplegic from waist down. From NMO since 2013: continued baclofen, gabapentin, tizanidine  Renal: **paraplegia** SCr 1.54>>0.73. Lytes ok Enablex + Myrbetriq  Pulm: 4L [place]>>RA  Heme/Onc: Hgb now stable - lots of bleeding on EKOS. PLTC stable this am on Ferrous sulf 325 tid. prednisone 10 qd + Imuran for Sjorgren's dz.  PTA Medication Issues: alendronate, carvedilol, metformin  Best Practices: hep gtt, PPI  Goal of Therapy:  Heparin level 0.3-0.7 units/ml Monitor platelets by anticoagulation protocol: Yes   Plan:  Continue IV heparin at 1450 units/hr Start Coumadin 7.5mg  po x 1 tonight. Daily HL and CBC, INR Follow-up any bleeding   Dreydon Cardenas S. Merilynn Finlandobertson, PharmD, BCPS Clinical Staff Pharmacist Pager [phone]  [name], Manha Amato Stillinger 04/20/2015,10:05 AM

## 2015-04-20 NOTE — Progress Notes (Signed)
UR COMPLETED  

## 2015-04-20 NOTE — Progress Notes (Addendum)
PULMONARY / CRITICAL CARE MEDICINE   Name: Susan Shepherd MRN: [id number] DOB: October 03, 1951    ADMISSION DATE:  04/17/2015 CONSULTATION DATE:  04/20/2015  REFERRING MD :  [facility] ED  CHIEF COMPLAINT:  SOB  INITIAL PRESENTATION:   Diagnosed with Submassive PE. Known Sjogren on imuran/prednisone, paraplegic, DM, overw. Hx of GI Bleeding in dec 2015 needing embolizaion and then rebleed FEb 2016 due to diveritcular disease; needed PRBC. She is DNR/DNI but full medical care. CT A with submassive PE. PCP is Jasmine December ? In [place], [place]. s/p EKOS by IR and has ongoing infusion 04/17/15    EVENTS CTA chest 6/2 >>> acute bilateral PE with CT evidence of right heart strain (RV / LV 2.4). 6/2 - transferred to [facility] ICU with bilateral PE for consideration EKOS. 04/17/15 - started EKOS. DVT + at their eval   04/18/15: s/p completion of EKOS just now. Will start IV heaprin in an hour. Feeling better. Duplex LE still peding. Denies complaints.  RN reports - no overnight events and patient/RN requesting change to regular diet   SUBJECTIVE/OVERNIGHT/INTERVAL HX 04/19/15: Bedside hx with RN:   On IV hpearin now. 93% pulse ox on RA. Doing well. ECHO yesterday - PASP 68 (cpompared to 48 in feb 2016). RV mildly dilated and  RVEF reduced. LVEF 60%. Duplex LE pending. She is eating well and denies complaints.  RN also says IR has not yet removed sheath    SUBJECTIVE/OVERNIGHT/INTERVAL HX 04/20/15: patient feeling  Better. No bleeding issues. Wants foley out. Hx from RN: no overnight issues. No arrhtymias.   D/w PCP Dr Jasmine December Reill [phone] -> wants foley out today . Patient self caths. Prefers coumadin over NOAC; patient can get moitoring via [facility] pgm. Patient left decision of noac v coumadin to pccm/pcp preference. PCP wants lovenox  brigde with coumadin    VITAL SIGNS: Temp:  [98.2 F (36.8 C)-99.5 F (37.5 C)] 99.5 F (37.5 C) (06/06 0930) Pulse Rate:  [87-107] 107 (06/06 0930) Resp:  [20] 20 (06/06  0930) BP: (126-151)/(85-99) 127/85 mmHg (06/06 0930) SpO2:  [91 %-95 %] 94 % (06/06 0930) Weight:  [102.6 kg (226 lb 3.1 oz)] 102.6 kg (226 lb 3.1 oz) (06/06 0500) HEMODYNAMICS:   VENTILATOR SETTINGS:   INTAKE / OUTPUT: Intake/Output      06/05 0701 - 06/06 0700 06/06 0701 - 06/07 0700   P.O. 600 480   I.V. (mL/kg) 320.1 (3.1)    Other     Total Intake(mL/kg) 920.1 (9) 480 (4.7)   Urine (mL/kg/hr) 2325 (0.9) 700 (1.6)   Stool 1 (0)    Total Output 2326 700   Net -1405.9 -220          PHYSICAL EXAMINATION: General: Pleasant adult AA female, in NAD. Neuro: A&O x 3, non-focal.  HEENT: [place]/AT. PERRL, sclerae anicteric.  [place] in place. Cardiovascular: Tachy, regular, no M/R/G.  Lungs: Respirations even and unlabored.  CTA bilaterally, No W/R/R.  Abdomen: BS x 4, soft, NT/ND.  Musculoskeletal: Paralyzed from waist down.  No gross deformities, 2+ pitting edema bilaterally to knees. Both RLE and LLE  Are equally warm Skin: Intact, warm, no rashes.  LABS:  PULMONARY No results for input(s): PHART, PCO2ART, PO2ART, HCO3, TCO2, O2SAT in the last 168 hours.  Invalid input(s): PCO2, PO2  CBC  Recent Labs Lab 04/18/15 1440 04/19/15 0215 04/20/15 0317  HGB 8.5* 8.4* 8.2*  HCT 28.6* 28.1* 27.7*  WBC 5.9 6.5 6.2  PLT 187 195 203  COAGULATION  Recent Labs Lab 04/16/15 2137  INR 1.17    CARDIAC    Recent Labs Lab 04/16/15 2137 04/17/15 0228 04/17/15 0754  TROPONINI 0.12* 0.16* 0.18*   No results for input(s): PROBNP in the last 168 hours.   CHEMISTRY  Recent Labs Lab 04/16/15 2137 04/17/15 0754 04/18/15 1334 04/19/15 0215 04/20/15 0317  NA 138 137 138 140 140  K 5.5* 4.6 4.1 4.0 4.1  CL 104 107 108 113* 111  CO2 21* 19* 20* 22 22  GLUCOSE 153* 87 110* 135* 126*  BUN 26* 26* 13 14 11   CREATININE 1.62* 1.54* 0.73 0.65 0.73  CALCIUM 9.1 8.0* 7.9* 8.1* 8.5*  MG  --  1.9  --  1.9 1.9  PHOS  --  5.5*  --  2.1* 2.2*   Estimated Creatinine Clearance:  82.4 mL/min (by C-G formula based on Cr of 0.73).   LIVER  Recent Labs Lab 04/16/15 2137  AST 71*  ALT 35  ALKPHOS 62  BILITOT 0.8  PROT 6.7  ALBUMIN 3.3*  INR 1.17     INFECTIOUS  Recent Labs Lab 04/18/15 0414  LATICACIDVEN 1.8     ENDOCRINE CBG (last 3)   Recent Labs  04/19/15 1630 04/19/15 2208 04/20/15 0545  GLUCAP 182* 163* 123*         IMAGING x48h  - iNo results found.      ASSESSMENT / PLAN:  PULMONARY A: Acute hypoxic respiratory failure due to bilateral PE with CT evidence of right heart strain (RV / LV 2.4) - she has submassive / massive PE -had transient hypotension at admit   - 04/20/15: Not in distress  Pulse ox 92% on RA. S.p completion of EKOS lytic on 04/18/15 AM P:   O2 as needed to maintain SpO2 > 92%. IV heparin per protocol - consider change to lovenox if care mgmt can work this out for [facility] Coumad[facility] per pharmacy (PCP feels this is better for patient) Avoid IVC filter given VERY HIGH RISK for DVT syndrome. - d./w PCP   Long discussion on anticogulation risks, benefits, limitations v IVC filter ad no anticoagualtion  - she is at VERY VERY HIGH RISK of DVT/PE next several months or even longer (very thrombogenic due to massive PE now in setting of chronic immobility). Any IVC filter now will put her at very high risk for DVT syndrome. She does not want DVT syndrome (still does some transfers with PT). She would rather accept risk of bleed and take anticogulation but does want team [facility] and [facility]   - Discussed NOAC v coumadin if she opted for anticogulation - she left choice to PCP/PCCM - final rec is Coumadin   - Long term: atleast 6 - 24 months coumadin with inr > 2, then INR > 1.5 (d/w PCP - we feel this approach balances best reisk of bleed v death from PE)   CARDIOVASCULAR A:  #BAseline Hx HTN  #current Trop leak - likely due to pe   - no acute issue.  ECHO with RV strain post EKOS on 04/18/15 -  Pulmonary Hypertension P:  Echo.might need opd fu - Dr Kendrick FriesMcQuaid  RENAL   A:   AKI - improved 04/17/15, resolved 04/19/15  Mild hypomagnesemia (<2) Mild hypophostemia (<2.3)   P:   Saline lock Mag and phos repletion again 04/20/2015 Fu BMP   GASTROINTESTINAL A:   Nutrition Hx GI bleeding due to diverticulosis - required embolization  in Dec 2015, then bled again Feb 2016 requiring transfusion   - so far no active bleeding  P:   Regular diet Will need send note to her [facility] GI docs and set up [facility] followup    HEMATOLOGIC A:   Anemia - chronic - no active bleeding  P:  Transfuse per usual ICU guidelines. CBC in AM.  INFECTIOUS A:   No indication of infection P:   Monitor clinically.  ENDOCRINE A:   DM P:   SSI. Hold outpatient metformin.  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 DC foley Intermittent foley  RHEUMATOLOGIC A: Hx Sjogrens P: Continue outpatient azathioprine, prednisone. - immunisuppressants   High risk toxic medications Monitior cell count periodically with this   Family updated: patient and duagyhter updated 04/17/15 at bedside. Patient again 04/18/15. And 04/19/15 adn 04/20/15   Interdisciplinary Family Meeting v Palliative Care Meeting:  Due by: 04/23/15.   GLOBAL Dc tele. STays on PCCM. Aim dc 04/21/15 once lovenox can be set up     Dr. Kalman Shan, M.D., [facility].C.P Pulmonary and Critical Care Medicine Staff Physician [facility] [place] Pulmonary and Critical Care Pager: [phone], If no answer or between  15:00h - 7:00h: call [phone]  04/20/2015 11:11 AM

## 2015-04-21 DIAGNOSIS — I10 Essential (primary) hypertension: Secondary | ICD-10-CM

## 2015-04-21 DIAGNOSIS — R339 Retention of urine, unspecified: Secondary | ICD-10-CM

## 2015-04-21 LAB — CBC WITH DIFFERENTIAL/PLATELET
BASOS ABS: 0 10*3/uL (ref 0.0–0.1)
Basophils Relative: 1 % (ref 0–1)
EOS ABS: 0.2 10*3/uL (ref 0.0–0.7)
EOS PCT: 3 % (ref 0–5)
HEMATOCRIT: 28.3 % — AB (ref 36.0–46.0)
HEMOGLOBIN: 8.5 g/dL — AB (ref 12.0–15.0)
LYMPHS ABS: 1.7 10*3/uL (ref 0.7–4.0)
Lymphocytes Relative: 26 % (ref 12–46)
MCH: 25 pg — ABNORMAL LOW (ref 26.0–34.0)
MCHC: 30 g/dL (ref 30.0–36.0)
MCV: 83.2 fL (ref 78.0–100.0)
Monocytes Absolute: 0.6 10*3/uL (ref 0.1–1.0)
Monocytes Relative: 10 % (ref 3–12)
Neutro Abs: 3.9 10*3/uL (ref 1.7–7.7)
Neutrophils Relative %: 62 % (ref 43–77)
Platelets: 208 10*3/uL (ref 150–400)
RBC: 3.4 MIL/uL — ABNORMAL LOW (ref 3.87–5.11)
RDW: 18.1 % — ABNORMAL HIGH (ref 11.5–15.5)
WBC: 6.4 10*3/uL (ref 4.0–10.5)

## 2015-04-21 LAB — BASIC METABOLIC PANEL
ANION GAP: 9 (ref 5–15)
BUN: 10 mg/dL (ref 6–20)
CALCIUM: 8.7 mg/dL — AB (ref 8.9–10.3)
CO2: 25 mmol/L (ref 22–32)
Chloride: 109 mmol/L (ref 101–111)
Creatinine, Ser: 0.6 mg/dL (ref 0.44–1.00)
GFR calc Af Amer: >60 mL/min (ref >60)
GFR calc non Af Amer: >60 mL/min (ref >60)
Glucose, Bld: 94 mg/dL (ref 65–99)
POTASSIUM: 3.8 mmol/L (ref 3.5–5.1)
SODIUM: 143 mmol/L (ref 135–145)

## 2015-04-21 LAB — MAGNESIUM: MAGNESIUM: 1.7 mg/dL (ref 1.7–2.4)

## 2015-04-21 LAB — GLUCOSE, CAPILLARY
GLUCOSE-CAPILLARY: 142 mg/dL — AB (ref 65–99)
GLUCOSE-CAPILLARY: 149 mg/dL — AB (ref 65–99)
Glucose-Capillary: 100 mg/dL — ABNORMAL HIGH (ref 65–99)
Glucose-Capillary: 120 mg/dL — ABNORMAL HIGH (ref 65–99)

## 2015-04-21 LAB — PROTIME-INR
INR: 1.26 (ref 0.00–1.49)
Prothrombin Time: 16 seconds — ABNORMAL HIGH (ref 11.6–15.2)

## 2015-04-21 LAB — PHOSPHORUS: Phosphorus: 3.3 mg/dL (ref 2.5–4.6)

## 2015-04-21 LAB — HEPARIN LEVEL (UNFRACTIONATED): Heparin Unfractionated: 0.33 IU/mL (ref 0.30–0.70)

## 2015-04-21 MED ORDER — ENOXAPARIN SODIUM 120 MG/0.8ML ~~LOC~~ SOLN
120.0000 mg | SUBCUTANEOUS | Status: DC
  Administered 2015-04-21 – 2015-04-22 (×2): 120 mg via SUBCUTANEOUS
  Filled 2015-04-21 (×2): qty 0.8

## 2015-04-21 MED ORDER — LABETALOL HCL 5 MG/ML IV SOLN
10.0000 mg | INTRAVENOUS | Status: DC | PRN
  Administered 2015-04-21 (×2): 10 mg via INTRAVENOUS
  Filled 2015-04-21 (×3): qty 4

## 2015-04-21 MED ORDER — CARVEDILOL 3.125 MG PO TABS
3.1250 mg | ORAL_TABLET | Freq: Two times a day (BID) | ORAL | Status: DC
  Administered 2015-04-21 – 2015-04-22 (×2): 3.125 mg via ORAL
  Filled 2015-04-21 (×4): qty 1

## 2015-04-21 MED ORDER — WARFARIN SODIUM 5 MG PO TABS
5.0000 mg | ORAL_TABLET | Freq: Once | ORAL | Status: AC
  Administered 2015-04-21: 5 mg via ORAL
  Filled 2015-04-21: qty 1

## 2015-04-21 MED ORDER — METFORMIN HCL 500 MG PO TABS
500.0000 mg | ORAL_TABLET | Freq: Every day | ORAL | Status: DC
  Administered 2015-04-22: 500 mg via ORAL
  Filled 2015-04-21 (×2): qty 1

## 2015-04-21 MED ORDER — AMLODIPINE BESYLATE 5 MG PO TABS
5.0000 mg | ORAL_TABLET | Freq: Every day | ORAL | Status: DC
  Administered 2015-04-21 – 2015-04-22 (×2): 5 mg via ORAL
  Filled 2015-04-21 (×2): qty 1

## 2015-04-21 NOTE — Care Management Note (Signed)
Case Management Note  Patient Details  Name: Delia HeadyRosalie Borunda MRN: [id number] Date of Birth: [birth year]  Subjective/Objective:      Admitted with Acute PE   Action/Plan:  [facility] [phone] talked to the SW Carollee HerterShannon and Selena BattenKim about home care needs. Patient is receiving a personal care service every morning and evening everyday and also goes to the [facility] center to receive PT/ OT/ SW/ recreational services. Patient also has a Charity fundraiserN available for her 24 hrs. [facility] was informed of the possible discharge home tomorrow.  Expected Discharge Date:   possibly 04/22/2015               Expected Discharge Plan:   home with [facility] services    [facility]  Other - See comment  Status of Service:  In process, will continue to follow  Medicare Important Message Given:  Yes Date Medicare IM Given:  04/21/15 Medicare IM give by:  Abelino DerrickB Kaige Whistler RN Date Additional Medicare IM Given:      Reola MosherChandler, Shae Hinnenkamp L, RN,BSN,MHA [phone] 04/21/2015, 10:08 AM

## 2015-04-21 NOTE — Progress Notes (Addendum)
ANTICOAGULATION CONSULT NOTE - Follow Up Consult  Pharmacy Consult new Coumadin + Heparin>>Lovenox Indication: PE/DVT  Allergies  Allergen Reactions  . Bee Venom Anaphylaxis  . Tocilizumab     Patient Measurements: Height: 5\' 3"  (160 cm) Weight: 221 lb 9 oz (100.5 kg) (Scale A) IBW/kg (Calculated) : 52.4 Heparin Dosing Weight: 75 kg  Vital Signs: Temp: 98.5 F (36.9 C) (06/07 0432) Temp Source: Oral (06/07 0432) BP: 156/102 mmHg (06/07 0921) Pulse Rate: 100 (06/07 0921)  Labs:  Recent Labs  04/19/15 0215  04/19/15 1650 04/20/15 0317 04/21/15 0335  HGB 8.4*  --   --  8.2* 8.5*  HCT 28.1*  --   --  27.7* 28.3*  PLT 195  --   --  203 208  LABPROT  --   --   --   --  16.0*  INR  --   --   --   --  1.26  HEPARINUNFRC  --   < > 0.44 0.70 0.33  CREATININE 0.65  --   --  0.73 0.60  < > = values in this interval not displayed.  Estimated Creatinine Clearance: 81.4 mL/min (by C-G formula based on Cr of 0.6).   Assessment: 64 y.o. female presents to [place] on 04/17/2015 with SOB. Found to have extensive b/l PE on CT scan with R heart strain. 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  AC: Heparin for extensive b/l PE with R heart strain (RV/LV 2.4). EKOS 6/3, successful and removed 6/4 AM. Had lots of bleeding while on EKOS, now resolved. +DVT. Coumadin started in preparation for discharge.Hgb 8.5 low but stable. Plts stable. Baseline INR 1.17. Heparin level 0.33. INR up to 1.26 this AM. Convert to Lovenox. Pt extensively education regarding Coumadin. **Noted pt with GIB 12/15 and rebleed 12/2014  **Try to avoid IVC filter given VERY HIGH RISK for DVT syndrome.**  ID: Afeb, wbc wnl, MRSA pcr neg. Bactrim daily PTA (DO NOT RESUME WHILE ON COUMADIN!!)  CV: HTN. BP elevated 156/102, HR 100. EF 60%. Labetolol prn (Coreg PTA)  Endo: CBGs 100-136 on  SSI (on steroids); (PTA: metformin 500 daily)  GI: AST mild elev, otherwise wnl. PPI po  Neuro:  Paraplegic from waist down. From NMO since 2013: continued baclofen, gabapentin, tizanidine  Renal: **paraplegia** SCr 1.54>>0.73. Phos replaced to 3.3. Meds: Enablex + Myrbetriq  Pulm: 4L [place]>>RA  Heme/Onc: Hgb now stable - lots of bleeding on EKOS. PLTC stable this am on Ferrous sulf 325 tid. prednisone 10 qd + Imuran for Sjorgren's dz.  PTA Medication Issues: alendronate, carvedilol, metformin  Best Practices: hep gtt>>LMWH, PPI  Goal of Therapy:  Heparin level 0.3-0.7 units/ml Monitor platelets by anticoagulation protocol: Yes   Plan:  Change IV heparin to Lovenox 1.5mg /kg/day = 120mg /day Coumadin 5mg  po x 1 tonight. Daily HL and CBC, INR Follow-up any bleeding    Susan Shepherd, PharmD, BCPS Clinical Staff Pharmacist Pager [phone]  Susan Shepherd, Susan Shepherd 04/21/2015,9:34 AM

## 2015-04-21 NOTE — Progress Notes (Signed)
PULMONARY / CRITICAL CARE MEDICINE   Name: Susan Shepherd MRN: [id number] DOB: 11/17/1950    ADMISSION DATE:  04/17/2015 CONSULTATION DATE:  04/21/2015   PCP:  Lajuana Matte, MD in [place] [phone]).  Followed by [facility] program.   REFERRING MD :  [facility] ED  CHIEF COMPLAINT:  SOB  INITIAL PRESENTATION:  64 y/o F with PMH of paraplegia, Sjogren's (on imuran / pred) and DM who presented to [facility] with 2 day hx of SOB.  Found to have a submassive PE.  Hx of GIB (diverticular disease) s/p embolization in 2015.  DNR/DNI but full medical care.  Underwent EKOS by IR on 6/3 with transition to heparin gtt.    STUDIES:  6/04  ECHO >> PASP 68 (48 in 12/2014), RV mildly dilated, RVEF reduced, LVEF 60% 6/03  Venous duplex >>  EVENTS 6/02 CTA chest >> acute bilateral PE with CT evidence of right heart strain (RV / LV 2.4). 6/02  Transferred to [facility] ICU with bilateral PE for consideration EKOS. 6/03  EKOS (bilateral).  DVT + at their eval 6/04  EKOS completed, IV heparin initiated.   6/05  Remains on IV heparin, sats 93% on RA.   6/06  No bleeding issues, foley removed to resume self cath regimen    SUBJECTIVE:  Pt reports BP up overnight. Noted SBP > 200.  Pt reports this is usually not an issue and she is not on medications for HTN.  She also notes her bladder schedule is off and she has had difficulty holding urine.       VITAL SIGNS: Temp:  [97.8 F (36.6 C)-98.6 F (37 C)] 98.5 F (36.9 C) (06/07 0432) Pulse Rate:  [80-100] 100 (06/07 0921) Resp:  [18] 18 (06/07 0921) BP: (121-180)/(82-127) 156/102 mmHg (06/07 0921) SpO2:  [94 %-98 %] 98 % (06/07 0921) Weight:  [221 lb 9 oz (100.5 kg)] 221 lb 9 oz (100.5 kg) (06/07 0432)  INTAKE / OUTPUT: Intake/Output      06/06 0701 - 06/07 0700 06/07 0701 - 06/08 0700   P.O. 1260 660   I.V. (mL/kg) 406 (4)    Total Intake(mL/kg) 1666 (16.6) 660 (6.6)   Urine (mL/kg/hr) 4150 (1.7) 3200 (9.1)   Stool     Total Output 4150 3200   Net -2484  -2540        Urine Occurrence 1 x      PHYSICAL EXAMINATION: General: Pleasant adult AA female, in NAD. Neuro: A&O x 3, non-focal.  HEENT: [place]/AT. PERRL, sclerae anicteric.   Cardiovascular: s1s2 rrr, no M/R/G.  Lungs: Respirations even and unlabored.  CTA bilaterally Abdomen: BS x 4, soft, NT/ND.  Musculoskeletal: Paralyzed from waist down.  No gross deformities, 2+ pitting BLE edema bilaterally to knees.  Skin: Intact, warm, no rashes.  LABS:  CBC  Recent Labs Lab 04/19/15 0215 04/20/15 0317 04/21/15 0335  HGB 8.4* 8.2* 8.5*  HCT 28.1* 27.7* 28.3*  WBC 6.5 6.2 6.4  PLT 195 203 208   COAGULATION  Recent Labs Lab 04/16/15 2137 04/21/15 0335  INR 1.17 1.26   CARDIAC  Recent Labs Lab 04/16/15 2137 04/17/15 0228 04/17/15 0754  TROPONINI 0.12* 0.16* 0.18*   No results for input(s): PROBNP in the last 168 hours.  CHEMISTRY  Recent Labs Lab 04/17/15 0754 04/18/15 1334 04/19/15 0215 04/20/15 0317 04/21/15 0335  NA 137 138 140 140 143  K 4.6 4.1 4.0 4.1 3.8  CL 107 108 113* 111 109  CO2 19* 20* 22 22 25  GLUCOSE 87 110* 135* 126* 94  BUN 26* 13 14 11 10   CREATININE 1.54* 0.73 0.65 0.73 0.60  CALCIUM 8.0* 7.9* 8.1* 8.5* 8.7*  MG 1.9  --  1.9 1.9 1.7  PHOS 5.5*  --  2.1* 2.2* 3.3   Estimated Creatinine Clearance: 81.4 mL/min (by C-G formula based on Cr of 0.6).  LIVER  Recent Labs Lab 04/16/15 2137 04/21/15 0335  AST 71*  --   ALT 35  --   ALKPHOS 62  --   BILITOT 0.8  --   PROT 6.7  --   ALBUMIN 3.3*  --   INR 1.17 1.26   ENDOCRINE CBG (last 3)   Recent Labs  04/20/15 1647 04/20/15 2051 04/21/15 0617  GLUCAP 136* 110* 100*     IMAGING x48h No results found.     ASSESSMENT / PLAN:  PULMONARY A: Acute hypoxic respiratory failure due to bilateral PE with CT evidence of right heart strain (RV / LV 2.4).   Submassive / massive PE with transient hypotension at admit.  Underwent EKOS  6/3 - 6/4 with subsequent transition to  heparin.    P:   O2 as needed to maintain SpO2 > 92%. D/C heparin gtt Change to lovenox with teaching for planned d/c in am 6/8 Coumadin per pharmacy  Avoid IVC filter given VERY HIGH RISK for DVT syndrome. - d./w PCP  Will need anticoagulation for at minimum 6 months but would consider longer, up to 24 months coumadin with inr > 2, then INR > 1.5 (d/w PCP - feel this approach balances best risk of bleed vs death from PE)   CARDIOVASCULAR A:  Hx HTN Trop leak - secondary to PE.  ECHO with RV strain post EKOS on 04/18/15 - Pulmonary Hypertension Pulmonary HTN - ECHO with RV strain post EKOS P:  Given ECHO findings, consider follow up with [facility]  Resume Coreg   RENAL A:   AKI - resolved 04/19/15 Mild hypomagnesemia (<2) Mild hypophostemia (<2.3) P:   Trend BMP / UOP Replace electrolytes as indicated   GASTROINTESTINAL A:   Morbid Obesity  Hx GI bleeding due to diverticulosis - required embolization in Dec 2015, then bled again Feb 2016 requiring transfusion P:   Regular diet Will need [facility] and [facility] follow[facility]  Monitor for bleeding   HEMATOLOGIC A:   Anemia - chronic - no active bleeding P:  Transfuse per usual ICU guidelines. CBC intermittently   ENDOCRINE A:   DM P:   SSI. Hold outpatient metformin.  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 Hx neuromyelitis optica with paraplegia since 2013 P:   Continue outpatient baclofen, gabapentin, tizanidine.  GU A: Neurogenic Bladder  P: Q4 I/O Cath for now Continue Mirabegron  RHEUMATOLOGIC A: Hx Sjogrens P: Continue outpatient azathioprine, prednisone. - immunisuppressants   High risk toxic medications Monitior cell count periodically with this   Family updated: patient updated at bedside.     GLOBAL:  She is concerned with bladder issues and blood pressure about going home.  Resume home BP meds.  Ensure patient has adequate teaching regarding  lovenox, bladder training.  Hopeful for d/c in am 6/8    Susan BrimBrandi Davis Vannatter, NP-C [facility] Pgr: [phone] or [phone] 04/21/2015 10:30 AM

## 2015-04-21 NOTE — Progress Notes (Addendum)
Paged Dr. Marchelle Gearingamaswamy regarding pt elevated BP. Orders received to give PRN lebatalol and continue to monitor.     1150 notified Dr. Marchelle Gearingamaswamy of pt urine output of 3100 ml over the past 4 hours. No new orders, continue to self cath q 4 hr.  Jilda PandaBethany Dawana Asper RN

## 2015-04-21 NOTE — Progress Notes (Signed)
eLink Physician-Brief Progress Note Patient Name: Susan HeadyRosalie Shepherd DOB: 01-15-1951 MRN: [id number]   Date of Service  04/21/2015  HPI/Events of Note  Hypertension with systolic BP greater than 200 with HR in the 90s  eICU Interventions  Order for PRN labetalol 10 mg IV q2 hours prn systolic greater than 170 mmHg     Intervention Category Intermediate Interventions: Hypertension - evaluation and management  DETERDING,ELIZABETH 04/21/2015, 6:31 AM

## 2015-04-22 ENCOUNTER — Encounter: Payer: Self-pay | Admitting: Adult Health

## 2015-04-22 ENCOUNTER — Telehealth: Payer: Self-pay | Admitting: Internal Medicine

## 2015-04-22 DIAGNOSIS — R339 Retention of urine, unspecified: Secondary | ICD-10-CM

## 2015-04-22 DIAGNOSIS — N179 Acute kidney failure, unspecified: Secondary | ICD-10-CM

## 2015-04-22 LAB — BASIC METABOLIC PANEL
ANION GAP: 10 (ref 5–15)
BUN: 11 mg/dL (ref 6–20)
CO2: 26 mmol/L (ref 22–32)
Calcium: 8.8 mg/dL — ABNORMAL LOW (ref 8.9–10.3)
Chloride: 106 mmol/L (ref 101–111)
Creatinine, Ser: 0.55 mg/dL (ref 0.44–1.00)
GFR calc Af Amer: >60 mL/min (ref >60)
GFR calc non Af Amer: >60 mL/min (ref >60)
Glucose, Bld: 99 mg/dL (ref 65–99)
Potassium: 3.8 mmol/L (ref 3.5–5.1)
SODIUM: 142 mmol/L (ref 135–145)

## 2015-04-22 LAB — GLUCOSE, CAPILLARY
GLUCOSE-CAPILLARY: 96 mg/dL (ref 65–99)
GLUCOSE-CAPILLARY: 150 mg/dL — AB (ref 65–99)

## 2015-04-22 LAB — MAGNESIUM: Magnesium: 1.5 mg/dL — ABNORMAL LOW (ref 1.7–2.4)

## 2015-04-22 LAB — PROTIME-INR
INR: 1.2 (ref 0.00–1.49)
PROTHROMBIN TIME: 15.3 s — AB (ref 11.6–15.2)

## 2015-04-22 LAB — PHOSPHORUS: Phosphorus: 4.4 mg/dL (ref 2.5–4.6)

## 2015-04-22 MED ORDER — WARFARIN SODIUM 7.5 MG PO TABS
7.5000 mg | ORAL_TABLET | Freq: Once | ORAL | Status: DC
  Filled 2015-04-22: qty 1

## 2015-04-22 MED ORDER — AMLODIPINE BESYLATE 5 MG PO TABS: 5.0000 mg | ORAL_TABLET | Freq: Every day | ORAL | Status: DC

## 2015-04-22 MED ORDER — MAGNESIUM SULFATE 2 GM/50ML IV SOLN
2.0000 g | Freq: Once | INTRAVENOUS | Status: AC
  Administered 2015-04-22: 2 g via INTRAVENOUS
  Filled 2015-04-22: qty 50

## 2015-04-22 MED ORDER — FERROUS SULFATE 325 (65 FE) MG PO TABS: 325.0000 mg | ORAL_TABLET | Freq: Every day | ORAL | Status: DC

## 2015-04-22 MED ORDER — ENOXAPARIN SODIUM 120 MG/0.8ML ~~LOC~~ SOLN: 120.0000 mg | SUBCUTANEOUS | Status: DC

## 2015-04-22 MED ORDER — WARFARIN SODIUM 5 MG PO TABS: ORAL_TABLET | ORAL | Status: DC

## 2015-04-22 NOTE — Telephone Encounter (Signed)
[name]  Plese call patient PCP Susan Shepherd in [place]. [name] got discharged 04/22/2015 but urine culture from yesterday is75K ENterococcus. SEnsit pending. She can start abx  Dr. Kalman ShanMurali Velina Drollinger, M.D., [facility].P Pulmonary and Critical Care Medicine Staff Physician [facility] Pager: [phone], If no answer or between  15:00h - 7:00h: call [phone]  04/22/2015 4:33 PM

## 2015-04-22 NOTE — Progress Notes (Signed)
I/O cath performed with 325cc urine output. Changed daily dressing on Right groin r/t cardiac cath that was previously performed.

## 2015-04-22 NOTE — Progress Notes (Addendum)
Informed by Steward DroneBrenda, Case Manager, to call [facility] at [phone], [phone], when pt is ready to be discharged.  I have not called this number bec pt is not ready to be discharged.  However, driver arrived at hospital to pick up pt, even though I did not call for them to do so.  Also, pt is now getting Magnesium IV as per physician's order.  Once mag is finished, and pt finishes lunch, we will then perform and I/O cath, and get pt ready to go.  Driver is waiting for pt to be ready for transfer. Discharge orders were placed at 1305.

## 2015-04-22 NOTE — Discharge Summary (Signed)
Physician Discharge Summary  Patient ID: Susan Shepherd MRN: [id number] DOB/AGE: 64/28/1952 64 y.o.  Admit date: 04/17/2015 Discharge date: 04/22/2015    Discharge Diagnoses:  Active Problems:   Acute pulmonary embolism   Pulmonary embolism   Acute renal injury   Hypertension   Urinary retention    Brief Summary: Susan Shepherd is a 64 y/o F with PMH of paraplegia, Sjogren's (on imuran / pred) and DM who presented to [facility] with 2 day hx of SOB. Found to have a submassive PE with evidence of R heart strain on echo.  Mildly dilated RV on echo with elevated PA pressure.  Underwent EKOS by IR on 6/3-6/4 with transition to heparin gtt and eventual transition to coumadin with lovenox bridge managed by pharmacy. INR continues to climb slowly.  Course c/b acute kidney injury, now resolved and HTN which is much improved on amlodipine.  At time of d/c she is much improved, denies SOB or chest pain, good oxygenation on RA.  She is medically cleared for d/c home with home health assist, [facility] assistance and close outpt PCP f/u for coumadin management.   STUDIES:  6/04 ECHO >> PASP 68 (48 in 12/2014), RV mildly dilated, RVEF reduced, LVEF 60% 6/03 Venous duplex >> non-occlusive DVT in LEFT popliteal vein.  Superficial thrombosis in LSV.  Occlusive DVT R popliteal.   EVENTS 6/02 CTA chest >> acute bilateral PE with CT evidence of right heart strain (RV / LV 2.4). 6/02 Transferred to [facility] ICU with bilateral PE for consideration EKOS. 6/03 EKOS (bilateral). DVT + at their eval 6/04 EKOS completed, IV heparin initiated.  6/05 Remains on IV heparin, sats 93% on RA.  6/06 No bleeding issues, foley removed to resume self cath regimen 04/21/15: Pt reports BP up overnight. Noted SBP > 200. Pt reports this is usually not an issue and she is not on medications for HTN. She also notes her bladder schedule is off and she has had difficulty holding urine.                                                                       **Discharge Plan for outpt MD by Discharge Diagnosis**  Acute hypoxic respiratory failure - r/t bilat PE with CT evidence R heart strain  Submassive PE - s/p EKOS 6/3-6/4 D/c plan --  Continue coumadin with lovenox bridge - will need minimum 2-3 more days overlap.   Will d/c on coumadin 7.27m daily and lovenox 1239mSQ daily until seen 6/10 by PCP  Outpt PCP f/u Friday 6/10 for INR check and coumadin dosing  Minimum 6 months anticoagulation but ideally would consider longer -- 12-24 months with INR >2 then INR 1.5   HTN D/c plan --  Cont low dose coreg Continue new rx amlodipine at d/c - PCP to decide on keeping this v remove this  AKI  Hypomagnesemia  D/c plan --  outpt f/u chem, mg   Morbid Obesity  Hx GI bleeding due to diverticulosis - required embolization in Dec 2015, then bled again Feb 2016 requiring transfusion P:  Regular diet PCP f/u -- She needs [facility] follow-up but is unsure of MD  DM D/c plan --   Resume outpatient metformin. PCP f/u  Paraplegia - paralyzed from waist down. Hx neuromyelitis optica with paraplegia since 2013 D/c plan -- :  Continue outpatient baclofen, gabapentin, tizanidine. PCP f/u   Neurogenic Bladder  D/c plan --  Continue Mirabegron PCP f/u   Hx Sjogrens D/c plan --  Continue outpatient azathioprine, prednisone. - immunisuppressants High risk toxic medications Monitior cell count periodically with this PCP f/u    Filed Vitals:   04/21/15 1437 04/21/15 1514 04/22/15 0416 04/22/15 0835  BP: 123/86 114/80 143/101 150/96  Pulse: 113 123 95 113  Temp: 97.4 F (36.3 C)  98.9 F (37.2 C)   TempSrc: Oral  Oral   Resp: 18  20   Height:      Weight:   210 lb 15.7 oz (95.7 kg)   SpO2: 98%  98%      Discharge Labs  BMET  Recent Labs Lab 04/17/15 0754 04/18/15 1334 04/19/15 0215 04/20/15 0317 04/21/15 0335 04/22/15 0339  NA 137 138 140 140 143 142  K 4.6 4.1 4.0 4.1 3.8 3.8  CL  107 108 113* 111 109 106  CO2 19* 20* _0 GLUCOSE 87 110* 135* 126* 94 99  BUN 26* _1 CREATININE 1.54* 0.73 0.65 0.73 0.60 0.55  CALCIUM 8.0* 7.9* 8.1* 8.5* 8.7* 8.8*  MG 1.9  --  1.9 1.9 1.7 1.5*  PHOS 5.5*  --  2.1* 2.2* 3.3 4.4     CBC   Recent Labs Lab 04/19/15 0215 04/20/15 0317 04/21/15 0335  HGB 8.4* 8.2* 8.5*  HCT 28.1* 27.7* 28.3*  WBC 6.5 6.2 6.4  PLT 195 203 208   Anti-Coagulation  Recent Labs Lab 04/16/15 2137 04/21/15 0335 04/22/15 0339  INR 1.17 1.26 1.20             Follow-up Information    Follow up with Gareth Morgan, MD On 04/24/2015.   Why:  [phone] - fax.  Attn Dr Coralie Common.    Contact information:   [facility] [address], [place] [address] [phone]          Medication List    STOP taking these medications        ciprofloxacin 500 MG tablet  Commonly known as:  CIPRO     SEPTRA PO      TAKE these medications        acetaminophen 325 MG tablet  Commonly known as:  TYLENOL  Take 650 mg by mouth daily as needed.     alendronate 70 MG tablet  Commonly known as:  FOSAMAX  Take 70 mg by mouth once a week. Saturday. Take with a full glass of water on an empty stomach.     amLODipine 5 MG tablet  Commonly known as:  NORVASC  Take 1 tablet (5 mg total) by mouth daily.     azaTHIOprine 50 MG tablet  Commonly known as:  IMURAN  Take 100 mg by mouth 2 (two) times daily.     baclofen 10 MG tablet  Commonly known as:  LIORESAL  Take 10 mg by mouth 3 (three) times daily.     calcium carbonate 1250 (500 CA) MG tablet  Commonly known as:  OS-CAL - dosed in mg of elemental calcium  Take 1 tablet by mouth daily with breakfast.     carvedilol 3.125 MG tablet  Commonly known as:  COREG  Take 3.125 mg by mouth 2 (two) times daily with a meal.  cholecalciferol 1000 UNITS tablet  Commonly known as:  VITAMIN D  Take 1,000 Units by mouth daily.     enoxaparin 120  MG/0.8ML injection  Commonly known as:  LOVENOX  Inject 0.8 mLs (120 mg total) into the skin daily.     EPINEPHrine 0.3 mg/0.3 mL Soaj injection  Commonly known as:  EPI-PEN  Inject 0.3 mg into the muscle once.     ferrous sulfate 325 (65 FE) MG tablet  Take 1 tablet (325 mg total) by mouth daily with breakfast.     gabapentin 400 MG capsule  Commonly known as:  NEURONTIN  Take 400 mg by mouth 2 (two) times daily. At 9:am and noon     gabapentin 300 MG capsule  Commonly known as:  NEURONTIN  Take 600 mg by mouth at bedtime.     metFORMIN 500 MG tablet  Commonly known as:  GLUCOPHAGE  Take 500 mg by mouth daily with breakfast.     mirabegron ER 50 MG Tb24 tablet  Commonly known as:  MYRBETRIQ  Take 50 mg by mouth at bedtime.     nystatin cream  Commonly known as:  MYCOSTATIN  Apply 1 application topically daily as needed for dry skin.     omeprazole 40 MG capsule  Commonly known as:  PRILOSEC  Take 40 mg by mouth daily.     predniSONE 10 MG tablet  Commonly known as:  DELTASONE  Take 10 mg by mouth daily with breakfast.     senna 8.6 MG Tabs tablet  Commonly known as:  SENOKOT  Take 2 tablets by mouth at bedtime as needed for mild constipation or moderate constipation.     solifenacin 10 MG tablet  Commonly known as:  VESICARE  Take 10 mg by mouth daily.     tiZANidine 2 MG tablet  Commonly known as:  ZANAFLEX  Take 2 mg by mouth 3 (three) times daily. Take 42m at 8, and noon, then 466mat 6:pm     vitamin C 500 MG tablet  Commonly known as:  ASCORBIC ACID  Take 500 mg by mouth 2 (two) times daily.     warfarin 5 MG tablet  Commonly known as:  COUMADIN  Take 7.53m52m1.5 tablets) daily until Friday 6/10 then as directed by Dr. ReiCoralie Common        Disposition: Home/[facility] center with home health   Discharged Condition: RosStarkisha Tulliss met maximum benefit of inpatient care and is medically stable and cleared for discharge.  Patient is pending follow up as  above.      Time spent on disposition:  Greater than 35 minutes.   Signed: WDarlina SicilianP 04/22/2015  11:43 AM Pager: [phone] or [phone] during the described time interval was provided by me and/or other providers on the critical care team. I have reviewed this patient's available data, including medical history, events of note, physical examination and test results as part of my evaluation.

## 2015-04-22 NOTE — Discharge Instructions (Signed)
Information on my medicine - Coumadin®   (Warfarin) ° °This medication education was reviewed with me or my healthcare representative as part of my discharge preparation.  The pharmacist that spoke with me during my hospital stay was:  Aroldo Galli Stillinger, RPH ° °Why was Coumadin prescribed for you? °Coumadin was prescribed for you because you have a blood clot or a medical condition that can cause an increased risk of forming blood clots. Blood clots can cause serious health problems by blocking the flow of blood to the heart, lung, or brain. Coumadin can prevent harmful blood clots from forming. °As a reminder your indication for Coumadin is:   Blood Clotting Disorder ° °What test will check on my response to Coumadin? °While on Coumadin (warfarin) you will need to have an INR test regularly to ensure that your dose is keeping you in the desired range. The INR (international normalized ratio) number is calculated from the result of the laboratory test called prothrombin time (PT). ° °If an INR APPOINTMENT HAS NOT ALREADY BEEN MADE FOR YOU please schedule an appointment to have this lab work done by your health care provider within 7 days. °Your INR goal is usually a number between:  2 to 3 or your provider may give you a more narrow range like 2-2.5.  Ask your health care provider during an office visit what your goal INR is. ° °What  do you need to  know  About  COUMADIN? °Take Coumadin (warfarin) exactly as prescribed by your healthcare provider about the same time each day.  DO NOT stop taking without talking to the doctor who prescribed the medication.  Stopping without other blood clot prevention medication to take the place of Coumadin may increase your risk of developing a new clot or stroke.  Get refills before you run out. ° °What do you do if you miss a dose? °If you miss a dose, take it as soon as you remember on the same day then continue your regularly scheduled regimen the next day.  Do not  take two doses of Coumadin at the same time. ° °Important Safety Information °A possible side effect of Coumadin (Warfarin) is an increased risk of bleeding. You should call your healthcare provider right away if you experience any of the following: °? Bleeding from an injury or your nose that does not stop. °? Unusual colored urine (red or dark brown) or unusual colored stools (red or black). °? Unusual bruising for unknown reasons. °? A serious fall or if you hit your head (even if there is no bleeding). ° °Some foods or medicines interact with Coumadin® (warfarin) and might alter your response to warfarin. To help avoid this: °? Eat a balanced diet, maintaining a consistent amount of Vitamin K. °? Notify your provider about major diet changes you plan to make. °? Avoid alcohol or limit your intake to 1 drink for women and 2 drinks for men per day. °(1 drink is 5 oz. wine, 12 oz. beer, or 1.5 oz. liquor.) ° °Make sure that ANY health care provider who prescribes medication for you knows that you are taking Coumadin (warfarin).  Also make sure the healthcare provider who is monitoring your Coumadin knows when you have started a new medication including herbals and non-prescription products. ° °Coumadin® (Warfarin)  Major Drug Interactions  °Increased Warfarin Effect Decreased Warfarin Effect  °Alcohol (large quantities) °Antibiotics (esp. Septra/Bactrim, Flagyl, Cipro) °Amiodarone (Cordarone) °Aspirin (ASA) °Cimetidine (Tagamet) °Megestrol (Megace) °NSAIDs (ibuprofen, naproxen, etc.) °Piroxicam (  Feldene) Propafenone (Rythmol SR) Propranolol (Inderal) Isoniazid (INH) Posaconazole (Noxafil) Barbiturates (Phenobarbital) Carbamazepine (Tegretol) Chlordiazepoxide (Librium) Cholestyramine (Questran) Griseofulvin Oral Contraceptives Rifampin Sucralfate (Carafate) Vitamin K   Coumadin (Warfarin) Major Herbal Interactions  Increased Warfarin Effect Decreased Warfarin Effect  Garlic Ginseng Ginkgo biloba  Coenzyme Q10 Green tea St. Johns wort    Coumadin (Warfarin) FOOD Interactions  Eat a consistent number of servings per week of foods HIGH in Vitamin K (1 serving =  cup)  Collards (cooked, or boiled & drained) Kale (cooked, or boiled & drained) Mustard greens (cooked, or boiled & drained) Parsley *serving size only =  cup Spinach (cooked, or boiled & drained) Swiss chard (cooked, or boiled & drained) Turnip greens (cooked, or boiled & drained)  Eat a consistent number of servings per week of foods MEDIUM-HIGH in Vitamin K (1 serving = 1 cup)  Asparagus (cooked, or boiled & drained) Broccoli (cooked, boiled & drained, or raw & chopped) Brussel sprouts (cooked, or boiled & drained) *serving size only =  cup Lettuce, raw (green leaf, endive, romaine) Spinach, raw Turnip greens, raw & chopped   These websites have more information on Coumadin (warfarin):  http://www.king-russell.com/; https://www.hines.net/;    Fibrinolytic Therapy Fibrinolytic therapy is medication that is used to break up blood clots. Blood clots in the body can cause serious problems. It is usually necessary to try and break up (dissolve) these clots before serious damage occurs such as:  Heart attack (myocardial infarction).  Stroke.  Blood clots in the lungs (pulmonary embolism).  Blood clots in the legs (deep vein thrombosis or DVT). CAUSES   Blood clots can form due to different reasons such as:  Fat deposits in the arteries or veins (atherosclerosis).  Diabetes.  Disease states that cause blood clots.  Smoking.  Trauma/Burns DIAGNOSIS  Many different tests can be used to diagnose a blockage in an artery or vein. Some of these may include:  Ultrasonography: This test diagnoses clots in a vessel by bouncing sound waves off the problem area.  Arteriography: This test studies arteries by taking x-rays after putting a dye into the problem vessel. The blood clot or blocked area then shows  up on the x-ray.  Venography: This test studies veins by taking x-rays after putting a dye into problem vessels. The blood clot or blocked area then shows up on the x-ray. TREATMENT  The decision to give fibrinolytic therapy is based upon:  Symptoms the patient is having.  Diagnostic test results.  Patient history.  Physical exam. The sooner clot busting medication is given, the better the outcome will be and the less damage will be done. This window of opportunity is a very short amount of time. By dissolving the clot, the blood is able to start flowing again to the damaged or blocked area. For example:  For heart attacks, thrombolytic therapy should be started within 30 minutes or less of heart attack symptoms.  For strokes that are caused by a clot, thrombolytic therapy should be started as soon as tests reveal that the stroke is caused by a clot. FIBRINOLYTIC THERAPY CANNOT BE USED IF:  A person has had prior bleeding into the brain.  A known stroke has occurred within the past 3 months.  The patient has active bleeding.  The patient has a possible aortic dissection.  A closed head injury has occurred within the past 3 months. FIBRINOLYTIC THERAPY IS USED WITH CAUTION FOR THE FOLLOWING:  A patient has very high blood pressure.  Recent internal  bleeding (within 2-4 weeks).  Active stomach ulcers.  Pregnancy.  A patient is on blood thinner medication such as coumadin or heparin. RISKS AND COMPLICATIONS  Bleeding (hemorrhage) is the most common risk associated with clot-busting medications.  Allergic reactions.  Bleeding into the brain (Intracerebral hemorrhage). SEEK IMMEDIATE MEDICAL CARE IF:   You throw up or cough up blood.  You have blood in bowel movements or your bowel movements have a coffee ground appearance.  You have blood in your urine or your urine is pink colored.  You have large amounts of bruising on your skin.  You have a severe headache that  does not go away. MAKE SURE YOU:   Understand these instructions.  Will watch your condition.  Will get help right away if you are not doing well or get worse. Document Released: 01/21/2004 Document Revised: 01/23/2012 Document Reviewed: 01/16/2009 [facility] Patient Information 2015 [place], [place]. This information is not intended to replace advice given to you by your health care provider. Make sure you discuss any questions you have with your health care provider.  Pulmonary Embolism A pulmonary (lung) embolism (PE) is a blood clot that has traveled to the lung and results in a blockage of blood flow in the affected lung. Most clots come from deep veins in the legs or pelvis. PE is a dangerous and potentially life-threatening condition that can be treated if identified. CAUSES Blood clots form in a vein for different reasons. Usually several things cause blood clots. They include:  The flow of blood slows down.  The inside of the vein is damaged in some way.  The person has a condition that makes the blood clot more easily. RISK FACTORS Some people are more likely than others to develop PE. Risk factors include:   Smoking.  Being overweight (obese).  Sitting or lying still for a long time. This includes long-distance travel, paralysis, or recovery from an illness or surgery. Other factors that increase risk are:   Older age, especially over 66 years of age.  Having a family history of blood clots or if you have already had a blood clot.  Having major or lengthy surgery. This is especially true for surgery on the hip, knee, or belly (abdomen). Hip surgery is particularly high risk.  Having a long, thin tube (catheter) placed inside a vein during a medical procedure.  Breaking a hip or leg.  Having cancer or cancer treatment.  Medicines containing the female hormone estrogen. This includes birth control pills and hormone replacement therapy.  Other circulation or heart  problems.  Pregnancy and childbirth.  Hormone changes make the blood clot more easily during pregnancy.  The fetus puts pressure on the veins of the pelvis.  There is a risk of injury to veins during delivery or a caesarean delivery. The risk is highest just after childbirth.  PREVENTION   Exercise the legs regularly. Take a brisk 30 minute walk every day.  Maintain a weight that is appropriate for your height.  Avoid sitting or lying in bed for long periods of time without moving your legs.  Women, particularly those over the age of 35 years, should consider the risks and benefits of taking estrogen medicines, including birth control pills.  Do not smoke, especially if you take estrogen medicines.  Long-distance travel can increase your risk. You should exercise your legs by walking or pumping the muscles every hour.  Many of the risk factors above relate to situations that exist with hospitalization, either for  illness, injury, or elective surgery. Prevention may include medical and nonmedical measures.   Your health care provider will assess you for the need for venous thromboembolism prevention when you are admitted to the hospital. If you are having surgery, your surgeon will assess you the day of or day after surgery.  SYMPTOMS  The symptoms of a PE usually start suddenly and include:  Shortness of breath.  Coughing.  Coughing up blood or blood-tinged mucus.  Chest pain. Pain is often worse with deep breaths.  Rapid heartbeat. DIAGNOSIS  If a PE is suspected, your health care provider will take a medical history and perform a physical exam. Other tests that may be required include:  Blood tests, such as studies of the clotting properties of your blood.  Imaging tests, such as ultrasound, CT, MRI, and other tests to see if you have clots in your legs or lungs.  An electrocardiogram. This can look for heart strain from blood clots in the lungs. TREATMENT   The  most common treatment for a PE is blood thinning (anticoagulant) medicine, which reduces the blood's tendency to clot. Anticoagulants can stop new blood clots from forming and old clots from growing. They cannot dissolve existing clots. Your body does this by itself over time. Anticoagulants can be given by mouth, through an intravenous (IV) tube, or by injection. Your health care provider will determine the best program for you.  Less commonly, clot-dissolving medicines (thrombolytics) are used to dissolve a PE. They carry a high risk of bleeding, so they are used mainly in severe cases.  Very rarely, a blood clot in the leg needs to be removed surgically.  If you are unable to take anticoagulants, your health care provider may arrange for you to have a filter placed in a main vein in your abdomen. This filter prevents clots from traveling to your lungs. HOME CARE INSTRUCTIONS   Take all medicines as directed by your health care provider.  Learn as much as you can about DVT.  Wear a medical alert bracelet or carry a medical alert card.  Ask your health care provider how soon you can go back to normal activities. It is important to stay active to prevent blood clots. If you are on anticoagulant medicine, avoid contact sports.  It is very important to exercise. This is especially important while traveling, sitting, or standing for long periods of time. Exercise your legs by walking or by tightening and relaxing your leg muscles regularly. Take frequent walks.  You may need to wear compression stockings. These are tight elastic stockings that apply pressure to the lower legs. This pressure can help keep the blood in the legs from clotting. Taking Warfarin Warfarin is a daily medicine that is taken by mouth. Your health care provider will advise you on the length of treatment (usually 3-6 months, sometimes lifelong). If you take warfarin:  Understand how to take warfarin and foods that can affect  how warfarin works in Public relations account executive.  Too much and too little warfarin are both dangerous. Too much warfarin increases the risk of bleeding. Too little warfarin continues to allow the risk for blood clots. Warfarin and Regular Blood Testing While taking warfarin, you will need to have regular blood tests to measure your blood clotting time. These blood tests usually include both the prothrombin time (PT) and international normalized ratio (INR) tests. The PT and INR results allow your health care provider to adjust your dose of warfarin. It is very important that  you have your PT and INR tested as often as directed by your health care provider.  Warfarin and Your Diet Avoid major changes in your diet, or notify your health care provider before changing your diet. Arrange a visit with a registered dietitian to answer your questions. Many foods, especially foods high in vitamin K, can interfere with warfarin and affect the PT and INR results. You should eat a consistent amount of foods high in vitamin K. Foods high in vitamin K include:   Spinach, kale, broccoli, cabbage, collard and turnip greens, Brussels sprouts, peas, cauliflower, seaweed, and parsley.  Beef and pork liver.  Green tea.  Soybean oil. Warfarin with Other Medicines Many medicines can interfere with warfarin and affect the PT and INR results. You must:  Tell your health care provider about any and all medicines, vitamins, and supplements you take, including aspirin and other over-the-counter anti-inflammatory medicines. Be especially cautious with aspirin and anti-inflammatory medicines. Ask your health care provider before taking these.  Do not take or discontinue any prescribed or over-the-counter medicine except on the advice of your health care provider or pharmacist. Warfarin Side Effects Warfarin can have side effects, such as easy bruising and difficulty stopping bleeding. Ask your health care provider or pharmacist about  other side effects of warfarin. You will need to:  Hold pressure over cuts for longer than usual.  Notify your dentist and other health care providers that you are taking warfarin before you undergo any procedures where bleeding may occur. Warfarin with Alcohol and Tobacco   Drinking alcohol frequently can increase the effect of warfarin, leading to excess bleeding. It is best to avoid alcoholic drinks or consume only very small amounts while taking warfarin. Notify your health care provider if you change your alcohol intake.  Do not use any tobacco products including cigarettes, chewing tobacco, or electronic cigarettes. If you smoke, quit. Ask your health care provider for help with quitting smoking. Alternative Medicines to Warfarin: Factor Xa Inhibitor Medicines  These blood thinning medicines are taken by mouth, usually for several weeks or longer. It is important to take the medicine every single day, at the same time each day.  There are no regular blood tests required when using these medicines.  There are fewer food and drug interactions than with warfarin.  The side effects of this class of medicine is similar to that of warfarin, including excessive bruising or bleeding. Ask your health care provider or pharmacist about other potential side effects. SEEK MEDICAL CARE IF:   You notice a rapid heartbeat.  You feel weaker or more tired than usual.  You feel faint.  You notice increased bruising.  Your symptoms are not getting better in the time expected.  You are having side effects of medicine. SEEK IMMEDIATE MEDICAL CARE IF:   You have chest pain.  You have trouble breathing.  You have new or increased swelling or pain in one leg.  You cough up blood.  You notice blood in vomit, in a bowel movement, or in urine.  You have a fever. Symptoms of PE may represent a serious problem that is an emergency. Do not wait to see if the symptoms will go away. Get medical  help right away. Call your local emergency services (911 in the [place]). Do not drive yourself to the hospital. Document Released: 10/28/2000 Document Revised: 03/17/2014 Document Reviewed: 11/11/2013 [facility] Patient Information 2015 [place]. This information is not intended to replace advice given to you by  your health care provider. Make sure you discuss any questions you have with your health care provider. ° °

## 2015-04-22 NOTE — Progress Notes (Signed)
Orders received for pt discharge.  Discharge summary printed and reviewed with pt.  Explained medication regimen, and pt had no further questions at this time.  IV removed and site remains clean, dry, intact.  Telemetry removed.  Pt in stable condition and awaiting transport. 

## 2015-04-22 NOTE — Care Management Note (Signed)
Case Management Note  Patient Details  Name: Susan HeadyRosalie Shepherd MRN: [id number] Date of Birth: 08/21/1951  Subjective/Objective:        Admitted with Acute PE            Action/Plan: Patient is to be discharged home today [place] [facility] [place]. [facility] called and is aware of discharge home today; [facility] on lovenox, [facility]   Expected Discharge Date:    04/22/2015              Expected Discharge Plan:   home with services provided by [facility]  Discharge planning Services  CM consult     [facility]  Other - See comment  Status of Service:  In process, will continue to follow  Medicare Important Message Given:  Yes Date Medicare IM Given:  04/21/15 Medicare IM give by:  Abelino DerrickB Raphael Espe RN  Cherrie DistanceChandler, Ahmya Bernick L, RN,BSN,MHA [phone] 04/22/2015, 11:52 AM

## 2015-04-22 NOTE — Progress Notes (Addendum)
ANTICOAGULATION CONSULT NOTE - Follow Up Consult  Pharmacy Consult for Lovenox/Coumadin Indication: PE/DVT  Allergies  Allergen Reactions  . Bee Venom Anaphylaxis  . Tocilizumab     Patient Measurements: Height: 5\' 3"  (160 cm) Weight: 210 lb 15.7 oz (95.7 kg) (bedscale ) IBW/kg (Calculated) : 52.4 Heparin Dosing Weight:    Vital Signs: Temp: 98.9 F (37.2 C) (06/08 0416) Temp Source: Oral (06/08 0416) BP: 150/96 mmHg (06/08 0835) Pulse Rate: 113 (06/08 0835)  Labs:  Recent Labs  04/19/15 1650 04/20/15 0317 04/21/15 0335 04/22/15 0339  HGB  --  8.2* 8.5*  --   HCT  --  27.7* 28.3*  --   PLT  --  203 208  --   LABPROT  --   --  16.0* 15.3*  INR  --   --  1.26 1.20  HEPARINUNFRC 0.44 0.70 0.33  --   CREATININE  --  0.73 0.60 0.55    Estimated Creatinine Clearance: 79.2 mL/min (by C-G formula based on Cr of 0.55).  Assessment:  Anticoag: Heparin>>Lovenox for extensive b/l PE with R heart strain (RV/LV 2.4). EKOS 6/3, successful and removed 6/4 AM. Had lots of bleeding while on EKOS, now resolved. +DVT.  Baseline INR 1.17. INR 1.26>1.2 today. No new CBC **Noted pt with GIB 12/15 and rebleed 12/2014 - CCM considering long-term coag options **Try to avoid IVC filter given VERY HIGH RISK for DVT syndrome.**  Goal of Therapy:  INR 2-3 Monitor platelets by anticoagulation protocol: Yes   Plan:  Lovenox 1.5mg /kg/day = 120mg /day Increase Coumadin to 7.5mg  daily with INR check by Friday. **Bactrim daily PTA (DO NOT RESUME WHILE ON COUMADIN!!)--pt doesn't remember taking this**   Susan Shepherd S. Merilynn Finlandobertson, PharmD, BCPS Clinical Staff Pharmacist Pager [phone]  Susan Shepherd, Susan Shepherd 04/22/2015,10:19 AM

## 2015-04-22 NOTE — Progress Notes (Signed)
PULMONARY / CRITICAL CARE MEDICINE   Name: Susan HeadyRosalie Shepherd MRN: [id number] DOB: 1951-11-10    ADMISSION DATE:  04/17/2015 CONSULTATION DATE:  04/22/2015   PCP:  Lajuana MatteSharon Riley, [place] in [place] [phone]).  Followed by [facility].   REFERRING MD :  [facility]  CHIEF COMPLAINT:  SOB  INITIAL PRESENTATION:  64 y/o F with PMH of paraplegia, Sjogren's (on imuran / pred) and DM who [facility] with 2 day hx of SOB.  Found to have a submassive PE.  Hx of GIB (diverticular disease) s/p embolization in 2015.  DNR/DNI but full medical care.  Underwent EKOS by IR on 6/3 with transition to heparin gtt.    STUDIES:  6/04  ECHO >> PASP 68 (48 in 12/2014), RV mildly dilated, RVEF reduced, LVEF 60% 6/03  Venous duplex >>  EVENTS 6/02 CTA chest >> acute bilateral PE with CT evidence of right heart strain (RV / LV 2.4). 6/02  [facility] with bilateral PE for consideration EKOS. 6/03  EKOS (bilateral).  DVT + at their eval 6/04  EKOS completed, IV heparin initiated.   6/05  Remains on IV heparin, sats 93% on RA.   6/06  No bleeding issues, foley removed to resume self cath regimen 04/21/15: Pt reports BP up overnight. Noted SBP > 200.  Pt reports this is usually not an issue and she is not on medications for HTN.  She also notes her bladder schedule is off and she has had difficulty holding urine.      SUBJECTIVE/OVERNIGHT/INTERVAL HX 04/22/15: urinary frequency improved though some leak +. BP improved after starting new scheduled amlodipine. Feels well.No bleeding. On lovenox Rx dose/coumading. FEels ready to go home. Wants notes sent to PCP above. RN noted that patient prefers DNR but order fell off computer. REclarified with patient - ok for short term ventilation. Ok for full medical care including pressors but NO CPR   VITAL SIGNS: Temp:  [97.4 F (36.3 C)-98.9 F (37.2 C)] 98.9 F (37.2 C) (06/08 0416) Pulse Rate:  [95-123] 113 (06/08 0835) Resp:  [18-20] 20 (06/08  0416) BP: (114-176)/(80-124) 150/96 mmHg (06/08 0835) SpO2:  [98 %] 98 % (06/08 0416) Weight:  [95.7 kg (210 lb 15.7 oz)] 95.7 kg (210 lb 15.7 oz) (06/08 0416)  INTAKE / OUTPUT: Intake/Output      06/07 0701 - 06/08 0700 06/08 0701 - 06/09 0700   P.O. 1360 600   I.V. (mL/kg) 72.5 (0.8)    Total Intake(mL/kg) 1432.5 (15) 600 (6.3)   Urine (mL/kg/hr) 7650 (3.3) 325 (1)   Total Output 7650 325   Net -6217.5 +275        Urine Occurrence 3 x 1 x     PHYSICAL EXAMINATION: General: Pleasant adult AA female, in NAD. Neuro: A&O x 3, non-focal.  HEENT: [place]/AT. PERRL, sclerae anicteric.   Cardiovascular: s1s2 rrr, no M/R/G.  Lungs: Respirations even and unlabored.  CTA bilaterally Abdomen: BS x 4, soft, NT/ND.  Musculoskeletal: Paralyzed from waist down.  No gross deformities, 1+ pitting BLE edema bilaterally to knees. BOt sides warm Skin: Intact, warm, no rashes.  LABS:  CBC  Recent Labs Lab 04/19/15 0215 04/20/15 0317 04/21/15 0335  HGB 8.4* 8.2* 8.5*  HCT 28.1* 27.7* 28.3*  WBC 6.5 6.2 6.4  PLT 195 203 208   COAGULATION  Recent Labs Lab 04/16/15 2137 04/21/15 0335 04/22/15 0339  INR 1.17 1.26 1.20   CARDIAC  Recent Labs Lab 04/16/15 2137 04/17/15 0228 04/17/15  0754  TROPONINI 0.12* 0.16* 0.18*   No results for input(s): PROBNP in the last 168 hours.  CHEMISTRY  Recent Labs Lab 04/17/15 0754 04/18/15 1334 04/19/15 0215 04/20/15 0317 04/21/15 0335 04/22/15 0339  NA 137 138 140 140 143 142  K 4.6 4.1 4.0 4.1 3.8 3.8  CL 107 108 113* 111 109 106  CO2 19* 20* GLUCOSE 87 110* 135* 126* 94 99  BUN 26* CREATININE 1.54* 0.73 0.65 0.73 0.60 0.55  CALCIUM 8.0* 7.9* 8.1* 8.5* 8.7* 8.8*  MG 1.9  --  1.9 1.9 1.7 1.5*  PHOS 5.5*  --  2.1* 2.2* 3.3 4.4   Estimated Creatinine Clearance: 79.2 mL/min (by C-G formula based on Cr of 0.55).  LIVER  Recent Labs Lab 04/16/15 2137 04/21/15 0335 04/22/15 0339  AST 71*  --   --    ALT 35  --   --   ALKPHOS 62  --   --   BILITOT 0.8  --   --   PROT 6.7  --   --   ALBUMIN 3.3*  --   --   INR 1.17 1.26 1.20   ENDOCRINE CBG (last 3)   Recent Labs  04/21/15 1609 04/21/15 2050 04/22/15 0601  GLUCAP 149* 120* 96     IMAGING x48h No results found.     Recent Labs Lab 04/16/15 2137 04/21/15 0335 04/22/15 0339  INR 1.17 1.26 1.20    ASSESSMENT / PLAN:  PULMONARY A: Acute hypoxic respiratory failure due to bilateral PE with CT evidence of right heart strain (RV / LV 2.4).   Submassive / massive PE with transient hypotension at admit.  Underwent EKOS  6/3 - 6/4 with subsequent transition to heparin.     - doing well on RA  - on lovenox /coumadin. INR still only 1.2  P:   OPD lovenox/coumadin - D2 is 04/22/2015  Avoid IVC filter given VERY HIGH RISK for DVT syndrome. - d./w PCP  Will need anticoagulation for at minimum 6 months but ideally would consider longer, 12 up to 24 months coumadin with inr > 2, then INR > 1.5 (d/w PCP - feel this approach balances best risk of bleed vs death from PE)   CARDIOVASCULAR A:  Hx HTN Trop leak - secondary to PE.  ECHO with RV strain post EKOS on 04/18/15 - Pulmonary Hypertension Pulmonary HTN - ECHO with RV strain post EKOS   04/22/15 BP improved but still higher side after restart home coreg yestserday + adding new amlodipine yesterday  P:  Coreg New amlodipine - PCP to decide on keeping this v remove this  RENAL A:   AKI - resolved 04/19/15 Moderate  hypomagnesemia (1.5) 04/22/2015  P:   Rpelete mag prior to dc 04/22/2015 Trend BMP / UOP Replace electrolytes as indicated   GASTROINTESTINAL A:   Morbid Obesity  Hx GI bleeding due to diverticulosis - required embolization in Dec 2015, then bled again Feb 2016 requiring transfusion   - none so  Far. She does not know name of her [facility] GI docs for [place] to send records P:   Regular diet Will need send note to her PCP . She needs [facility] followup Monitor for  bleeding   HEMATOLOGIC A:   Anemia - chronic - no active bleeding P:  Transfuse per usual ICU guidelines. CBC intermittently   ENDOCRINE A:   DM P:   SSI. Hold outpatient  metformin.  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 Hx neuromyelitis optica with paraplegia since 2013 P:   Continue outpatient baclofen, gabapentin, tizanidine.  GU A: Neurogenic Bladder    - improving fast. Urine culture 04/21/15 - pending  P: Q4 I/O Cath for now Continue Mirabegron  RHEUMATOLOGIC A: Hx Sjogrens P: Continue outpatient azathioprine, prednisone. - immunisuppressants   High risk toxic medications Monitior cell count periodically with this   Family updated: patient updated at bedside.     GLOBAL:  Home 04/22/2015   CODE - clarified again    Code Status Orders        Start     Ordered   04/22/15 1030  Limited resuscitation (code)   Continuous    Question Answer Comment  In the event of cardiac or respiratory ARREST: Initiate Code Blue, Call Rapid Response Yes   In the event of cardiac or respiratory ARREST: Perform CPR No   In the event of cardiac or respiratory ARREST: Perform Intubation/Mechanical Ventilation Yes   In the event of cardiac or respiratory ARREST: Use NIPPV/BiPAp only if indicated Yes   In the event of cardiac or respiratory ARREST: Administer ACLS medications if indicated No   In the event of cardiac or respiratory ARREST: Perform Defibrillation or Cardioversion if indicated No      04/22/15 1030    Advance Directive Documentation        Most Recent Value   Type of Advance Directive  Healthcare Power of Attorney   Pre-existing out of facility DNR order (yellow form or pink MOST form)     "MOST" Form in Place?       Dr. Kalman Shan, M.D., [facility] Pulmonary and Critical Care Medicine Staff Physician [facility] Pager: [phone], If no answer or between  15:00h - 7:00h: call [phone]  04/22/2015 10:48 AM

## 2015-04-23 ENCOUNTER — Ambulatory Visit: Payer: Medicare (Managed Care) | Admitting: Physical Therapy

## 2015-04-23 LAB — URINE CULTURE: Colony Count: 75000

## 2015-04-23 NOTE — Telephone Encounter (Signed)
Called and spoke to pt's nurse and informed her of the results and recs to start abx. Nurse verbalized understanding and stated she would relay the information to Dr. Purcell Mouton. Nothing further needed.

## 2015-04-30 ENCOUNTER — Encounter: Payer: Self-pay | Admitting: Physical Therapy

## 2015-04-30 ENCOUNTER — Ambulatory Visit: Payer: Medicare (Managed Care) | Attending: Family Medicine | Admitting: Physical Therapy

## 2015-04-30 DIAGNOSIS — M6281 Muscle weakness (generalized): Secondary | ICD-10-CM | POA: Insufficient documentation

## 2015-04-30 DIAGNOSIS — R531 Weakness: Secondary | ICD-10-CM

## 2015-04-30 DIAGNOSIS — R279 Unspecified lack of coordination: Secondary | ICD-10-CM | POA: Diagnosis not present

## 2015-04-30 DIAGNOSIS — R293 Abnormal posture: Secondary | ICD-10-CM

## 2015-04-30 DIAGNOSIS — G360 Neuromyelitis optica [Devic]: Secondary | ICD-10-CM | POA: Diagnosis present

## 2015-04-30 NOTE — Therapy (Signed)
[place] MAIN [facility] SERVICES [address] [place], [place], [address] Phone: [phone]   Fax:  [phone]  Physical Therapy Treatment  Patient Details  Name: Susan Shepherd MRN: [id number] Date of Birth: [birth year] Referring Provider:  Gareth Morgan, MD  Encounter Date: 04/30/2015      PT End of Session - 04/30/15 1518    Visit Number 33   Number of Visits 37   Date for PT Re-Evaluation 05/21/15   Authorization Type Supposed to get Medicare April 15, 2015   PT Start Time 1302   PT Stop Time 1358   PT Time Calculation (min) 56 min   Activity Tolerance Patient tolerated treatment well;Patient limited by fatigue   Behavior During Therapy [facility] for tasks assessed/performed      Past Medical History  Diagnosis Date  . Neuromuscular disorder   . Neuromyelitis optica   . Diabetes mellitus without complication   . Hypertension   . Paraplegia   . Sjogren's syndrome     Past Surgical History  Procedure Laterality Date  . Eye surgery      There were no vitals filed for this visit.  Visit Diagnosis:  Weakness  Lack of coordination  Abnormal posture      Subjective Assessment - 04/30/15 1517    Subjective Patient was recently admitted to the hospital for a PE; She reports getting warfarin and other meds to help reduce risk for additional clots; Patient reports feeling pretty good with minimal weakness following recent hospitalization; She reports still doing well with sliding board transfers;    Patient Stated Goals to be able to transfer independently and get back to PLOF.   Currently in Pain? No/denies      TREATMENT: Sliding board transfer wheelchair<>mat table, supervision with short sliding board (able to place board independently and demonstrates good scooting ability);  Supine: Great toe flexion/extension PROM x5 reps bilaterally, AROM x5 reps on LLE, AAROM x5 reps on RLE; Ankle DF/PF: PROM x5 reps bilaterally,  AAROM x5 reps bilaterally; Patient able to initiate LLE ankle DF through partial range independently;  Heel slides (passive concentric with cues to control eccentric return, min A) x5 bilaterally;  Sidelying: Pelvic elevation/depression with manual resistance 2x5 reps bilaterally;  Resisted BUE exercise: HOIST bicep curl, plate #1, 4M01; HOIST tricep press, plate #3, U27, O53, G64  Patient required min-moderate verbal/tactile cues for correct exercise technique including cues for increased ROM and muscle activation and cues for better positioning to improve muscle recruitment.     PT recommends patient start having PT at [facility] work on LE Strengthening/ROM to improve LE muscle activation;                       PT Education - 04/30/15 1518    Education provided Yes   Education Details LE strengthening, plan of care   Person(s) Educated Patient   Methods Explanation   Comprehension Verbalized understanding;Returned demonstration;Verbal cues required;Tactile cues required             PT Long Term Goals - 04/09/15 1414    PT LONG TERM GOAL #1   Title Patient will increase BUE gross strength to 5/5 to imrpove ability to push through BUE when repositioning for increased mobility at home. target 05/21/15   Status Partially Met   PT LONG TERM GOAL #2   Title Patient will be independent in home exercise program to improve strength/mobility for better functional independence with ADLs. Target  05/21/15   Status On-going   PT LONG TERM GOAL #3   Title Patient will increase functional reach to >10 inches with single UE in unsupported sitting to demonstrate an improvement in dynamic sitting balance for ADLs. target 05/21/15   Status Partially Met   PT LONG TERM GOAL #4   Title Patient will be independent in sliding board transfers for home ADLs. target 05/21/15   Status Achieved   PT LONG TERM GOAL #5   Title Patient will demonstrate increased LE strength as evidenced by  initiating AROM of BLE through partial range to assist with LE movement for bed mobility/transfers by 05/21/15   Status New               Plan - 04/30/15 1519    Clinical Impression Statement Advanced LE strengthening with additional supine exercise. Patient able to initiate good LLE ankle DF ROM however is still limited with RLE ankle movement. She was able to demonstrate better pelvic activation; Patient is still supervision for sliding board transfers. She would benefit from additional skilled PT intervention to improve balance and strength;    Pt will benefit from skilled therapeutic intervention in order to improve on the following deficits Decreased range of motion;Impaired flexibility;Postural dysfunction;Impaired sensation;Decreased safety awareness;Decreased endurance;Decreased activity tolerance;Obesity;Impaired UE functional use;Decreased balance;Decreased strength   Rehab Potential Fair   Clinical Impairments Affecting Rehab Potential positive: motivation, [facility] support, negative: chronic progressive condition   PT Frequency 1x / week   PT Duration 12 weeks   PT Treatment/Interventions ADLs/Self Care Home Management;Therapeutic exercise;Therapeutic activities;Cryotherapy;Moist Heat;Gait training;Neuromuscular re-education;Aquatic Therapy;Electrical Stimulation;Manual techniques;Energy conservation;Balance training;Passive range of motion;Functional mobility training   PT Next Visit Plan continue with UE resisted exercise with machines, work on [place] continue as previously given;        Problem List Patient Active Problem List   Diagnosis Date Noted  . Hypertension 04/21/2015  . Urinary retention 04/21/2015  . Acute renal injury 04/18/2015  . Acute pulmonary embolism 04/17/2015  . Pulmonary embolism 04/17/2015  . Diverticulosis of colon with hemorrhage   . Decubitus ulcer, stage I   . Pressure sore   . Chronic diastolic CHF (congestive  heart failure)   . Pulmonary hypertension   . LVH (left ventricular hypertrophy)   . Sjogren's disease   . Chronic paraplegia   . Acute GI bleeding 01/08/2015  . Acute blood loss anemia 01/08/2015  . Neuromyelitis optica 01/08/2015  . Essential hypertension 01/08/2015  . Diabetes mellitus type 2, controlled 01/08/2015    Hopkins,Margaret, PT, DPT 04/30/2015, 3:21 PM  [place] MAIN [facility] SERVICES [address] [place], [place], [address] Phone: [phone]   Fax:  [phone]

## 2015-05-07 ENCOUNTER — Ambulatory Visit: Payer: Medicare (Managed Care) | Admitting: Physical Therapy

## 2015-05-13 ENCOUNTER — Encounter: Payer: Self-pay | Admitting: Physical Therapy

## 2015-05-13 NOTE — Therapy (Signed)
[place] MAIN [facility] SERVICES [address] [place], [place], [address] Phone: [phone]   Fax:  [phone]  Discharge Summary  Patient Details  Name: Susan Shepherd MRN: [id number] Date of Birth: Jan 09, 1951 Referring Provider:  Gareth Morgan, MD  Encounter Date: 05/13/2015    Past Medical History  Diagnosis Date  . Neuromuscular disorder   . Neuromyelitis optica   . Diabetes mellitus without complication   . Hypertension   . Paraplegia   . Sjogren's syndrome     Past Surgical History  Procedure Laterality Date  . Eye surgery      There were no vitals filed for this visit.  Visit Diagnosis:  No diagnosis found.       PHYSICAL THERAPY DISCHARGE SUMMARY  Visits from Start of Care: 33 out of37  Current functional level related to goals / functional outcomes: Patient did not attend last appointment, unable to obtain objective measures; She was mod I for sliding board transfers and able to sit unsupported for short periods of time   Remaining deficits: Weakness, impaired balance, decreased mobility   Education / Equipment: Educated patient in positioning and functional mobility;  Plan: Patient agrees to discharge.  Patient goals were not met. Patient is being discharged due to the physician's request.  ?????                                   PT Long Term Goals - 04/09/15 1414    PT LONG TERM GOAL #1   Title Patient will increase BUE gross strength to 5/5 to imrpove ability to push through BUE when repositioning for increased mobility at home. target 05/21/15   Status Partially Met   PT LONG TERM GOAL #2   Title Patient will be independent in home exercise program to improve strength/mobility for better functional independence with ADLs. Target 05/21/15   Status On-going   PT LONG TERM GOAL #3   Title Patient will increase functional reach to >10 inches with single UE in unsupported  sitting to demonstrate an improvement in dynamic sitting balance for ADLs. target 05/21/15   Status Partially Met   PT LONG TERM GOAL #4   Title Patient will be independent in sliding board transfers for home ADLs. target 05/21/15   Status Achieved   PT LONG TERM GOAL #5   Title Patient will demonstrate increased LE strength as evidenced by initiating AROM of BLE through partial range to assist with LE movement for bed mobility/transfers by 05/21/15   Status New               Problem List Patient Active Problem List   Diagnosis Date Noted  . Hypertension 04/21/2015  . Urinary retention 04/21/2015  . Acute renal injury 04/18/2015  . Acute pulmonary embolism 04/17/2015  . Pulmonary embolism 04/17/2015  . Diverticulosis of colon with hemorrhage   . Decubitus ulcer, stage I   . Pressure sore   . Chronic diastolic CHF (congestive heart failure)   . Pulmonary hypertension   . LVH (left ventricular hypertrophy)   . Sjogren's disease   . Chronic paraplegia   . Acute GI bleeding 01/08/2015  . Acute blood loss anemia 01/08/2015  . Neuromyelitis optica 01/08/2015  . Essential hypertension 01/08/2015  . Diabetes mellitus type 2, controlled 01/08/2015   Thank you for this referral.  Hopkins,Margaret, PT, DPT 05/13/2015, 10:12 AM  [place], [place], [address] Phone: [phone]   Fax:  [phone]

## 2015-05-14 ENCOUNTER — Ambulatory Visit: Payer: Medicare (Managed Care) | Admitting: Physical Therapy

## 2015-07-04 ENCOUNTER — Encounter: Payer: Self-pay | Admitting: Emergency Medicine

## 2015-07-04 ENCOUNTER — Emergency Department: Payer: Medicare (Managed Care)

## 2015-07-04 ENCOUNTER — Inpatient Hospital Stay
Admission: EM | Admit: 2015-07-04 | Discharge: 2015-07-07 | DRG: 563 | Disposition: A | Discharge status: Skilled Nursing Facility | Payer: Medicare (Managed Care) | Attending: Internal Medicine | Admitting: Internal Medicine

## 2015-07-04 DIAGNOSIS — G360 Neuromyelitis optica [Devic]: Secondary | ICD-10-CM | POA: Diagnosis present

## 2015-07-04 DIAGNOSIS — Z888 Allergy status to other drugs, medicaments and biological substances status: Secondary | ICD-10-CM | POA: Diagnosis not present

## 2015-07-04 DIAGNOSIS — S82102A Unspecified fracture of upper end of left tibia, initial encounter for closed fracture: Secondary | ICD-10-CM | POA: Diagnosis present

## 2015-07-04 DIAGNOSIS — S82101A Unspecified fracture of upper end of right tibia, initial encounter for closed fracture: Secondary | ICD-10-CM | POA: Diagnosis present

## 2015-07-04 DIAGNOSIS — K5900 Constipation, unspecified: Secondary | ICD-10-CM | POA: Diagnosis present

## 2015-07-04 DIAGNOSIS — Z91030 Bee allergy status: Secondary | ICD-10-CM | POA: Diagnosis not present

## 2015-07-04 DIAGNOSIS — S82202A Unspecified fracture of shaft of left tibia, initial encounter for closed fracture: Secondary | ICD-10-CM

## 2015-07-04 DIAGNOSIS — I10 Essential (primary) hypertension: Secondary | ICD-10-CM | POA: Diagnosis present

## 2015-07-04 DIAGNOSIS — S82002A Unspecified fracture of left patella, initial encounter for closed fracture: Secondary | ICD-10-CM | POA: Diagnosis present

## 2015-07-04 DIAGNOSIS — S82831A Other fracture of upper and lower end of right fibula, initial encounter for closed fracture: Secondary | ICD-10-CM | POA: Diagnosis present

## 2015-07-04 DIAGNOSIS — S82001A Unspecified fracture of right patella, initial encounter for closed fracture: Secondary | ICD-10-CM

## 2015-07-04 DIAGNOSIS — E119 Type 2 diabetes mellitus without complications: Secondary | ICD-10-CM | POA: Diagnosis present

## 2015-07-04 DIAGNOSIS — N390 Urinary tract infection, site not specified: Secondary | ICD-10-CM | POA: Diagnosis present

## 2015-07-04 DIAGNOSIS — D638 Anemia in other chronic diseases classified elsewhere: Secondary | ICD-10-CM | POA: Diagnosis present

## 2015-07-04 DIAGNOSIS — N319 Neuromuscular dysfunction of bladder, unspecified: Secondary | ICD-10-CM | POA: Diagnosis present

## 2015-07-04 DIAGNOSIS — Z7952 Long term (current) use of systemic steroids: Secondary | ICD-10-CM

## 2015-07-04 DIAGNOSIS — S82401A Unspecified fracture of shaft of right fibula, initial encounter for closed fracture: Secondary | ICD-10-CM

## 2015-07-04 DIAGNOSIS — S82009A Unspecified fracture of unspecified patella, initial encounter for closed fracture: Secondary | ICD-10-CM | POA: Diagnosis present

## 2015-07-04 DIAGNOSIS — G8220 Paraplegia, unspecified: Secondary | ICD-10-CM | POA: Diagnosis present

## 2015-07-04 DIAGNOSIS — W19XXXA Unspecified fall, initial encounter: Secondary | ICD-10-CM | POA: Diagnosis present

## 2015-07-04 DIAGNOSIS — S82201A Unspecified fracture of shaft of right tibia, initial encounter for closed fracture: Secondary | ICD-10-CM | POA: Diagnosis present

## 2015-07-04 DIAGNOSIS — Z79899 Other long term (current) drug therapy: Secondary | ICD-10-CM | POA: Diagnosis not present

## 2015-07-04 DIAGNOSIS — Z86711 Personal history of pulmonary embolism: Secondary | ICD-10-CM | POA: Diagnosis not present

## 2015-07-04 DIAGNOSIS — M3500 Sicca syndrome, unspecified: Secondary | ICD-10-CM | POA: Diagnosis present

## 2015-07-04 DIAGNOSIS — R339 Retention of urine, unspecified: Secondary | ICD-10-CM | POA: Diagnosis present

## 2015-07-04 DIAGNOSIS — Y92003 Bedroom of unspecified non-institutional (private) residence as the place of occurrence of the external cause: Secondary | ICD-10-CM

## 2015-07-04 DIAGNOSIS — Z7901 Long term (current) use of anticoagulants: Secondary | ICD-10-CM | POA: Diagnosis not present

## 2015-07-04 DIAGNOSIS — Y9389 Activity, other specified: Secondary | ICD-10-CM | POA: Diagnosis not present

## 2015-07-04 HISTORY — DX: Paraplegia, unspecified: G82.20

## 2015-07-04 HISTORY — DX: Other pulmonary embolism without acute cor pulmonale: I26.99

## 2015-07-04 LAB — URINALYSIS COMPLETE WITH MICROSCOPIC (ARMC ONLY)
BILIRUBIN URINE: NEGATIVE
Bacteria, UA: NONE SEEN
GLUCOSE, UA: NEGATIVE mg/dL
Hgb urine dipstick: NEGATIVE
NITRITE: NEGATIVE
Protein, ur: NEGATIVE mg/dL
Specific Gravity, Urine: 1.018 (ref 1.005–1.030)
pH: 5 (ref 5.0–8.0)

## 2015-07-04 LAB — COMPREHENSIVE METABOLIC PANEL
ALK PHOS: 47 U/L (ref 38–126)
ALT: 44 U/L (ref 14–54)
ANION GAP: 9 (ref 5–15)
AST: 49 U/L — ABNORMAL HIGH (ref 15–41)
Albumin: 3.1 g/dL — ABNORMAL LOW (ref 3.5–5.0)
BUN: 17 mg/dL (ref 6–20)
CALCIUM: 8.5 mg/dL — AB (ref 8.9–10.3)
CO2: 23 mmol/L (ref 22–32)
Chloride: 108 mmol/L (ref 101–111)
Creatinine, Ser: 0.62 mg/dL (ref 0.44–1.00)
GFR calc Af Amer: >60 mL/min (ref >60)
GFR calc non Af Amer: >60 mL/min (ref >60)
Glucose, Bld: 87 mg/dL (ref 65–99)
POTASSIUM: 4.6 mmol/L (ref 3.5–5.1)
SODIUM: 140 mmol/L (ref 135–145)
Total Bilirubin: 1.3 mg/dL — ABNORMAL HIGH (ref 0.3–1.2)
Total Protein: 6 g/dL — ABNORMAL LOW (ref 6.5–8.1)

## 2015-07-04 LAB — CBC WITH DIFFERENTIAL/PLATELET
BASOS ABS: 0 10*3/uL (ref 0–0.1)
BASOS PCT: 1 %
EOS ABS: 0 10*3/uL (ref 0–0.7)
EOS PCT: 1 %
HCT: 27.8 % — ABNORMAL LOW (ref 35.0–47.0)
Hemoglobin: 8.7 g/dL — ABNORMAL LOW (ref 12.0–16.0)
LYMPHS ABS: 0.8 10*3/uL — AB (ref 1.0–3.6)
Lymphocytes Relative: 22 %
MCH: 24.3 pg — AB (ref 26.0–34.0)
MCHC: 31.3 g/dL — ABNORMAL LOW (ref 32.0–36.0)
MCV: 77.7 fL — ABNORMAL LOW (ref 80.0–100.0)
Monocytes Absolute: 0.8 10*3/uL (ref 0.2–0.9)
Monocytes Relative: 24 %
NEUTROS PCT: 52 %
Neutro Abs: 1.8 10*3/uL (ref 1.4–6.5)
PLATELETS: 174 10*3/uL (ref 150–440)
RBC: 3.58 MIL/uL — AB (ref 3.80–5.20)
RDW: 20.3 % — ABNORMAL HIGH (ref 11.5–14.5)
WBC: 3.4 10*3/uL — ABNORMAL LOW (ref 3.6–11.0)

## 2015-07-04 LAB — PROTIME-INR
INR: 2.78
PROTHROMBIN TIME: 29.4 s — AB (ref 11.4–15.0)

## 2015-07-04 LAB — APTT: aPTT: 78 seconds — ABNORMAL HIGH (ref 24–36)

## 2015-07-04 LAB — GLUCOSE, CAPILLARY: Glucose-Capillary: 87 mg/dL (ref 65–99)

## 2015-07-04 MED ORDER — MORPHINE SULFATE (PF) 4 MG/ML IV SOLN
4.0000 mg | INTRAVENOUS | Status: DC | PRN
Start: 2015-07-04 — End: 2015-07-07

## 2015-07-04 MED ORDER — TIZANIDINE HCL 4 MG PO TABS
2.0000 mg | ORAL_TABLET | Freq: Three times a day (TID) | ORAL | Status: DC
  Administered 2015-07-04 – 2015-07-07 (×8): 2 mg via ORAL
  Filled 2015-07-04 (×8): qty 1

## 2015-07-04 MED ORDER — WARFARIN SODIUM 2 MG PO TABS
2.0000 mg | ORAL_TABLET | Freq: Every day | ORAL | Status: DC
  Filled 2015-07-04: qty 1

## 2015-07-04 MED ORDER — PREDNISONE 5 MG PO TABS
10.0000 mg | ORAL_TABLET | Freq: Every day | ORAL | Status: DC
  Administered 2015-07-05 – 2015-07-07 (×3): 10 mg via ORAL
  Filled 2015-07-04 (×3): qty 2

## 2015-07-04 MED ORDER — MORPHINE SULFATE (PF) 2 MG/ML IV SOLN
INTRAVENOUS | Status: AC
  Filled 2015-07-04: qty 1

## 2015-07-04 MED ORDER — CARVEDILOL 6.25 MG PO TABS
6.2500 mg | ORAL_TABLET | Freq: Two times a day (BID) | ORAL | Status: DC
  Administered 2015-07-05 – 2015-07-07 (×5): 6.25 mg via ORAL
  Filled 2015-07-04 (×5): qty 1

## 2015-07-04 MED ORDER — SODIUM CHLORIDE 0.9 % IV SOLN
INTRAVENOUS | Status: DC
  Administered 2015-07-05: 05:00:00 via INTRAVENOUS

## 2015-07-04 MED ORDER — BACLOFEN 10 MG PO TABS
10.0000 mg | ORAL_TABLET | Freq: Three times a day (TID) | ORAL | Status: DC
  Administered 2015-07-04 – 2015-07-07 (×8): 10 mg via ORAL
  Filled 2015-07-04 (×8): qty 1

## 2015-07-04 MED ORDER — METFORMIN HCL 500 MG PO TABS
500.0000 mg | ORAL_TABLET | Freq: Every day | ORAL | Status: DC
  Administered 2015-07-05 – 2015-07-07 (×3): 500 mg via ORAL
  Filled 2015-07-04 (×3): qty 1

## 2015-07-04 MED ORDER — SODIUM CHLORIDE 0.9 % IV BOLUS (SEPSIS)
500.0000 mL | INTRAVENOUS | Status: AC
  Administered 2015-07-04: 500 mL via INTRAVENOUS

## 2015-07-04 MED ORDER — DARIFENACIN HYDROBROMIDE ER 7.5 MG PO TB24
15.0000 mg | ORAL_TABLET | Freq: Every day | ORAL | Status: DC
  Administered 2015-07-06 – 2015-07-07 (×2): 15 mg via ORAL
  Filled 2015-07-04 (×3): qty 2

## 2015-07-04 MED ORDER — WARFARIN SODIUM 2.5 MG PO TABS: 5.0000 mg | ORAL_TABLET | ORAL | Status: DC

## 2015-07-04 MED ORDER — CIPROFLOXACIN HCL 500 MG PO TABS
500.0000 mg | ORAL_TABLET | Freq: Two times a day (BID) | ORAL | Status: DC
  Administered 2015-07-04 – 2015-07-05 (×2): 500 mg via ORAL
  Filled 2015-07-04 (×2): qty 1

## 2015-07-04 MED ORDER — AZATHIOPRINE 50 MG PO TABS
100.0000 mg | ORAL_TABLET | Freq: Two times a day (BID) | ORAL | Status: DC
  Administered 2015-07-05 – 2015-07-07 (×5): 100 mg via ORAL
  Filled 2015-07-04 (×5): qty 2

## 2015-07-04 MED ORDER — HYDROCODONE-ACETAMINOPHEN 5-325 MG PO TABS
1.0000 | ORAL_TABLET | ORAL | Status: DC | PRN
  Administered 2015-07-05: 1 via ORAL
  Administered 2015-07-05 (×2): 2 via ORAL
  Administered 2015-07-05: 1 via ORAL
  Administered 2015-07-06 (×3): 2 via ORAL
  Administered 2015-07-06: 1 via ORAL
  Administered 2015-07-07 (×2): 2 via ORAL
  Filled 2015-07-04: qty 2
  Filled 2015-07-04: qty 1
  Filled 2015-07-04: qty 2
  Filled 2015-07-04: qty 1
  Filled 2015-07-04 (×4): qty 2
  Filled 2015-07-04: qty 1
  Filled 2015-07-04: qty 2
  Filled 2015-07-04: qty 1

## 2015-07-04 MED ORDER — INSULIN ASPART 100 UNIT/ML ~~LOC~~ SOLN
0.0000 [IU] | Freq: Every day | SUBCUTANEOUS | Status: DC
Start: 2015-07-04 — End: 2015-07-07

## 2015-07-04 MED ORDER — SODIUM CHLORIDE 0.9 % IV SOLN
Freq: Once | INTRAVENOUS | Status: AC
  Administered 2015-07-04: 20:00:00 via INTRAVENOUS

## 2015-07-04 MED ORDER — ONDANSETRON HCL 4 MG/2ML IJ SOLN
4.0000 mg | Freq: Four times a day (QID) | INTRAMUSCULAR | Status: DC | PRN
  Administered 2015-07-06: 4 mg via INTRAVENOUS
  Filled 2015-07-04: qty 2

## 2015-07-04 MED ORDER — INSULIN ASPART 100 UNIT/ML ~~LOC~~ SOLN: 0.0000 [IU] | Freq: Three times a day (TID) | SUBCUTANEOUS | Status: DC

## 2015-07-04 MED ORDER — ONDANSETRON HCL 4 MG/2ML IJ SOLN
4.0000 mg | INTRAMUSCULAR | Status: AC
  Administered 2015-07-04: 4 mg via INTRAVENOUS
  Filled 2015-07-04: qty 2

## 2015-07-04 MED ORDER — PANTOPRAZOLE SODIUM 40 MG PO TBEC
40.0000 mg | DELAYED_RELEASE_TABLET | Freq: Every day | ORAL | Status: DC
Start: 2015-07-05 — End: 2015-07-07
  Administered 2015-07-05 – 2015-07-07 (×3): 40 mg via ORAL
  Filled 2015-07-04 (×3): qty 1

## 2015-07-04 MED ORDER — GABAPENTIN 300 MG PO CAPS
600.0000 mg | ORAL_CAPSULE | Freq: Every day | ORAL | Status: DC
  Administered 2015-07-04 – 2015-07-06 (×3): 600 mg via ORAL
  Filled 2015-07-04 (×3): qty 2

## 2015-07-04 MED ORDER — MORPHINE SULFATE (PF) 4 MG/ML IV SOLN
4.0000 mg | Freq: Once | INTRAVENOUS | Status: DC
  Filled 2015-07-04: qty 1

## 2015-07-04 MED ORDER — SENNOSIDES-DOCUSATE SODIUM 8.6-50 MG PO TABS: 1.0000 | ORAL_TABLET | Freq: Every evening | ORAL | Status: DC | PRN

## 2015-07-04 MED ORDER — GABAPENTIN 400 MG PO CAPS
400.0000 mg | ORAL_CAPSULE | Freq: Two times a day (BID) | ORAL | Status: DC
  Administered 2015-07-05 – 2015-07-07 (×5): 400 mg via ORAL
  Filled 2015-07-04 (×5): qty 1

## 2015-07-04 MED ORDER — MORPHINE SULFATE (PF) 4 MG/ML IV SOLN
4.0000 mg | Freq: Once | INTRAVENOUS | Status: AC
  Administered 2015-07-04: 4 mg via INTRAVENOUS

## 2015-07-04 MED ORDER — MORPHINE SULFATE (PF) 4 MG/ML IV SOLN: 4.0000 mg | Freq: Once | INTRAVENOUS | Status: DC

## 2015-07-04 MED ORDER — WARFARIN - PHARMACIST DOSING INPATIENT: Freq: Every day | Status: DC

## 2015-07-04 MED ORDER — MIRABEGRON ER 50 MG PO TB24
50.0000 mg | ORAL_TABLET | Freq: Every day | ORAL | Status: DC
  Administered 2015-07-05 – 2015-07-06 (×2): 50 mg via ORAL
  Filled 2015-07-04 (×4): qty 1

## 2015-07-04 MED ORDER — ONDANSETRON HCL 4 MG PO TABS: 4.0000 mg | ORAL_TABLET | Freq: Four times a day (QID) | ORAL | Status: DC | PRN

## 2015-07-04 MED ORDER — MORPHINE SULFATE (PF) 2 MG/ML IV SOLN
2.0000 mg | Freq: Once | INTRAVENOUS | Status: AC
Start: 2015-07-04 — End: 2015-07-04
  Administered 2015-07-04: 2 mg via INTRAVENOUS
  Filled 2015-07-04: qty 1

## 2015-07-04 NOTE — H&P (Signed)
[facility] [place] at [facility]   PATIENT NAME: Susan Shepherd    MR#:  [id number]  DATE OF BIRTH:  05/18/1951  DATE OF ADMISSION:  07/04/2015  PRIMARY CARE PHYSICIAN: No primary care provider on file.   REQUESTING/REFERRING PHYSICIAN: York Cerise, MD  CHIEF COMPLAINT:   Chief Complaint  Patient presents with  . Fall  . Leg Injury    R Leg    HISTORY OF PRESENT ILLNESS:  Susan Shepherd  is a 64 y.o. 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 Orthopedics saw her in the ED and stated that they would treat her nonoperatively. Hospitalists were called for admission.  PAST MEDICAL HISTORY:   Past Medical History  Diagnosis Date  . Neuromuscular disorder   . Neuromyelitis optica   . Diabetes mellitus without complication   . Hypertension   . Paraplegia   . Sjogren's syndrome   . PE (pulmonary embolism)   . Chronic paraplegia     PAST SURGICAL HISTORY:   Past Surgical History  Procedure Laterality Date  . Eye surgery    . Pulmonary embolism surgery    . Stomach surgery    . Tubal ligation      SOCIAL HISTORY:   Social History  Substance Use Topics  . Smoking status: Never Smoker   . Smokeless tobacco: Never Used  . Alcohol Use: No    FAMILY HISTORY:   Family History  Problem Relation Age of Onset  . Adopted: Yes    DRUG ALLERGIES:   Allergies  Allergen Reactions  . Bee Venom Anaphylaxis  . Tocilizumab     MEDICATIONS AT HOME:   Prior to Admission medications   Medication Sig Start Date End Date Taking? Authorizing Provider  ciprofloxacin (CIPRO) 500 MG tablet Take 500 mg by mouth 2 (two) times daily. 07/03/15 07/07/15 Yes Historical Provider, MD  warfarin (COUMADIN) 10 MG tablet Take 20 mg by mouth daily at  6 PM.   Yes Historical Provider, MD  warfarin (COUMADIN) 10 MG tablet Take 10 mg by mouth every Saturday at 6 PM.   Yes Historical Provider, MD  acetaminophen (TYLENOL) 325 MG tablet Take 650 mg by mouth daily as needed.    Historical Provider, MD  alendronate (FOSAMAX) 70 MG tablet Take 70 mg by mouth once a week. Saturday. Take with a full glass of water on an empty stomach.    Historical Provider, MD  azaTHIOprine (IMURAN) 50 MG tablet Take 100 mg by mouth 2 (two) times daily.     Historical Provider, MD  baclofen (LIORESAL) 10 MG tablet Take 10 mg by mouth 3 (three) times daily.     Historical Provider, MD  calcium carbonate (OS-CAL - DOSED IN MG OF ELEMENTAL CALCIUM) 1250 (500 CA) MG tablet Take 1 tablet by mouth daily with breakfast.    Historical Provider, MD  carvedilol (COREG) 3.125 MG tablet Take 6.25 mg by mouth 2 (two) times daily with a meal.     Historical Provider, MD  cholecalciferol (VITAMIN D) 1000 UNITS tablet Take 1,000 Units by mouth daily.    Historical Provider, MD  EPINEPHrine 0.3 mg/0.3 mL IJ SOAJ injection Inject 0.3 mg into the muscle once.    Historical Provider, MD  gabapentin (NEURONTIN) 300 MG capsule Take 600 mg by mouth at bedtime.  Historical Provider, MD  gabapentin (NEURONTIN) 400 MG capsule Take 400 mg by mouth 2 (two) times daily. At 9:am and noon    Historical Provider, MD  metFORMIN (GLUCOPHAGE) 500 MG tablet Take 500 mg by mouth daily with breakfast.     Historical Provider, MD  mirabegron ER (MYRBETRIQ) 50 MG TB24 tablet Take 50 mg by mouth at bedtime.    Historical Provider, MD  omeprazole (PRILOSEC) 40 MG capsule Take 40 mg by mouth daily.    Historical Provider, MD  predniSONE (DELTASONE) 10 MG tablet Take 10 mg by mouth daily with breakfast.     Historical Provider, MD  senna (SENOKOT) 8.6 MG TABS tablet Take 2 tablets by mouth at bedtime as needed for mild constipation or moderate constipation.    Historical Provider, MD  solifenacin (VESICARE) 10 MG  tablet Take 10 mg by mouth daily.    Historical Provider, MD  tiZANidine (ZANAFLEX) 2 MG tablet Take 2 mg by mouth 3 (three) times daily. Take 6mg  at 8, and noon, then 4mg  at 6:pm    Historical Provider, MD  vitamin C (ASCORBIC ACID) 500 MG tablet Take 500 mg by mouth 2 (two) times daily.    Historical Provider, MD  warfarin (COUMADIN) 5 MG tablet Take 7.5mg  (1.5 tablets) daily until Friday 6/10 then as directed by Dr. Purcell Mouton. Patient taking differently: Take 5 mg by mouth every Saturday at 6 PM. Take 7.5mg  (1.5 tablets) daily until Friday 6/10 then as directed by Dr. Purcell Mouton. 04/22/15   Bernadene Person, NP    REVIEW OF SYSTEMS:  Review of Systems  Constitutional: Negative for fever, chills, weight loss and malaise/fatigue.  HENT: Negative for ear pain, hearing loss and tinnitus.   Eyes: Negative for blurred vision, double vision, pain and redness.       Chronic right eye blindness  Respiratory: Negative for cough, hemoptysis and shortness of breath.   Cardiovascular: Negative for chest pain, palpitations, orthopnea and leg swelling.  Gastrointestinal: Negative for nausea, vomiting, abdominal pain, diarrhea and constipation.  Genitourinary: Negative for dysuria, frequency and hematuria.       Chronic urinary retention  Musculoskeletal: Positive for falls. Negative for back pain and neck pain.       Bilateral leg pain  Skin:       No acne, rash, or lesions  Neurological: Negative for dizziness, tremors, focal weakness and weakness.  Endo/Heme/Allergies: Negative for polydipsia. Does not bruise/bleed easily.  Psychiatric/Behavioral: Negative for depression. The patient is not nervous/anxious and does not have insomnia.      VITAL SIGNS:   Filed Vitals:   07/04/15 1645 07/04/15 1730  BP: 101/79 110/85  Pulse: 98 100  Temp: 100.1 F (37.8 C)   TempSrc: Oral   Resp: 20   Height: 5\' 3"  (1.6 m)   Weight: 89.812 kg (198 lb)   SpO2: 96% 96%   Wt Readings from Last 3 Encounters:   07/04/15 89.812 kg (198 lb)  04/22/15 95.7 kg (210 lb 15.7 oz)  04/16/15 96.4 kg (212 lb 8.4 oz)    PHYSICAL EXAMINATION:  Physical Exam  Vitals reviewed. Constitutional: She is oriented to person, place, and time. She appears well-developed and well-nourished. No distress.  HENT:  Head: Normocephalic and atraumatic.  Mouth/Throat: Oropharynx is clear and moist.  Eyes: Conjunctivae and EOM are normal. Pupils are equal, round, and reactive to light. No scleral icterus.  Neck: Normal range of motion. Neck supple. No JVD present. No thyromegaly present.  Cardiovascular: Normal rate,  regular rhythm and intact distal pulses.  Exam reveals no gallop and no friction rub.   No murmur heard. Respiratory: Effort normal and breath sounds normal. No respiratory distress. She has no wheezes. She has no rales.  GI: Soft. Bowel sounds are normal. She exhibits no distension. There is no tenderness.  Musculoskeletal: Normal range of motion. She exhibits no edema.  Bilateral lower extremity fractures, bilateral patellar, left tibial, right tib-fib, all closed. She has noticeable deformity of her right lower extremity.  Lymphadenopathy:    She has no cervical adenopathy.  Neurological: She is alert and oriented to person, place, and time. No cranial nerve deficit.  No dysarthria, no aphasia  Skin: Skin is warm and dry. No rash noted. No erythema.  Psychiatric: She has a normal mood and affect. Her behavior is normal. Judgment and thought content normal.    LABORATORY PANEL:   CBC  Recent Labs Lab 07/04/15 1943  WBC 3.4*  HGB 8.7*  HCT 27.8*  PLT 174   ------------------------------------------------------------------------------------------------------------------  Chemistries   Recent Labs Lab 07/04/15 1943  NA 140  K 4.6  CL 108  CO2 23  GLUCOSE 87  BUN 17  CREATININE 0.62  CALCIUM 8.5*  AST 49*  ALT 44  ALKPHOS 47  BILITOT 1.3*    ------------------------------------------------------------------------------------------------------------------  Cardiac Enzymes No results for input(s): TROPONINI in the last 168 hours. ------------------------------------------------------------------------------------------------------------------  RADIOLOGY:  Dg Tibia/fibula Left  07/04/2015   CLINICAL DATA:  Status post fall with pain  EXAM: LEFT TIBIA AND FIBULA - 2 VIEW  COMPARISON:  None.  FINDINGS: There is displaced fracture of the patella. There is subtle curvilinear lucency in the proximal tibial shaft suspicious for nondisplaced fracture.  IMPRESSION: There is displaced fracture of the patella. There is subtle curvilinear lucency in the proximal tibial shaft suspicious for nondisplaced fracture.   Electronically Signed   By: Sherian Rein M.D.   On: 07/04/2015 18:12   Dg Tibia/fibula Right  07/04/2015   CLINICAL DATA:  Fall.  Fracture.  EXAM: RIGHT TIBIA AND FIBULA - 2 VIEW  COMPARISON:  Knee films same day.  FINDINGS: There is an obliquely oriented proximal tibial metaphysis fracture. 1 cortex width lateral displacement of the tibia of measuring 5 mm. There is also a fibular neck fracture which is mildly displaced. Diffuse subcutaneous edema is present in the leg. Osteopenia. There appears to be synostosis in the distal leg from previous trauma.  IMPRESSION: Proximal tibial and fibular metaphysis fractures with mild displacement. Diffuse osteopenia, likely secondary to disuse.   Electronically Signed   By: Andreas Newport M.D.   On: 07/04/2015 18:12   Dg Chest Portable 1 View  07/04/2015   CLINICAL DATA:  Patient with fever. No chest pain or shortness of breath.  EXAM: PORTABLE CHEST - 1 VIEW  COMPARISON:  Chest radiograph 06/10/2011  FINDINGS: Marked cardiomegaly, increased from prior. Scattered linear opacities within the left mid and right lower lung. No pleural effusion or pneumothorax. Regional skeleton is unremarkable.   IMPRESSION: Increased cardiomegaly.  Linear opacity within the left midlung is nonspecific however may represent infection, atelectasis and/or scarring. Short-term radiographic followup is recommended to ensure resolution and exclude the possibility of underlying mass at this location.   Electronically Signed   By: Annia Belt M.D.   On: 07/04/2015 20:32   Dg Knee Ap/lat W/sunrise Left  07/04/2015   CLINICAL DATA:  Status post fall yesterday with knee pain.  EXAM: LEFT KNEE 3 VIEWS  COMPARISON:  None.  FINDINGS: There is fracture of the patella, minimally displaced. There is curvilinear lucency projected in the proximal tibia seen on the lateral view. Further evaluation with dedicated tibia and fibular film is suggested.  IMPRESSION: Fracture of the patella.  There is curvilinear lucency projected in the proximal tibia seen on the lateral view. Further evaluation with dedicated tibia and fibular film is suggested.   Electronically Signed   By: Sherian Rein M.D.   On: 07/04/2015 18:08   Dg Knee Ap/lat W/sunrise Right  07/04/2015   CLINICAL DATA:  Status post fall with knee pain  EXAM: RIGHT KNEE 3 VIEWS  COMPARISON:  None.  FINDINGS: There is displaced fracture of the proximal tibia. There is comminuted displaced fracture proximal fibula. There is a suprapatellar effusion.  IMPRESSION: Fractures of the right tibia and fibula.   Electronically Signed   By: Sherian Rein M.D.   On: 07/04/2015 18:09    EKG:   Orders placed or performed during the hospital encounter of 04/16/15  . EKG 12-Lead  . EKG 12-Lead  . EKG    IMPRESSION AND PLAN:  Principal Problem:   Fracture of tibia with fibula, right, closed - orthopedics on board, will treat nonoperatively, control pain tonight, right leg already splinted. Active Problems:   Patellar fracture - follow orthopedics recommendations   Fracture of tibia, proximal, left, closed - as above   Neuromyelitis optica - continue azathioprine   Essential  hypertension - continue home medications, well controlled here   Diabetes mellitus type 2, controlled - continue home meds, add sliding scale insulin with appropriate glucose checks and carb modified diet   Urinary retention - continue home meds for this   History of pulmonary embolism - On warfarin for chronic anticoagulation, INR therapeutic, continue home meds   UTI - on Cipro, continue while here   All the records are reviewed and case discussed with ED provider. Management plans discussed with the patient and/or family.  DVT PROPHYLAXIS: Systemic anticoagulation  ADMISSION STATUS: Inpatient  CODE STATUS: full, the patient states she would not want prolonged invasive intervention for non-reversible conditions, or greater than 7 days  Advance Directive Documentation        Most Recent Value   Type of Advance Directive  Healthcare Power of Attorney   Pre-existing out of facility DNR order (yellow form or pink MOST form)     "MOST" Form in Place?        TOTAL TIME TAKING CARE OF THIS PATIENT: 60 minutes.    Shondra Capps FIELDING 07/04/2015, 9:50 PM  [facility]  Office  [phone]  CC: Primary care physician; No primary care provider on file.

## 2015-07-04 NOTE — ED Notes (Signed)
Pt presents via [facility]. Pt states she fell yesterday at home, called her PACE team this morning and had a mobile X-ray done. Per pt she was told that she has multiple lower leg fractures and to come to hospital. Pt states she took a hydrocodone prior to coming. R leg presents in a splint.

## 2015-07-04 NOTE — Progress Notes (Signed)
ANTICOAGULATION CONSULT NOTE - Initial Consult  Pharmacy Consult for warfarin Indication: history of pulmonary embolus  Allergies  Allergen Reactions  . Bee Venom Anaphylaxis  . Tocilizumab     Patient Measurements: Height:  (160 cm) Weight: 198 lb (89.812 kg) IBW/kg (Calculated) : 52.4 Heparin Dosing Weight:   Vital Signs: Temp: 97.9 F (36.6 C) (08/20 2237) Temp Source: Oral (08/20 2237) BP: 101/47 mmHg (08/20 2237) Pulse Rate: 88 (08/20 2237)  Labs:  Recent Labs  07/04/15 1943  HGB 8.7*  HCT 27.8*  PLT 174  APTT 78*  LABPROT 29.4*  INR 2.78  CREATININE 0.62    Estimated Creatinine Clearance: 76.6 mL/min (by C-G formula based on Cr of 0.62).   Medical History: Past Medical History  Diagnosis Date  . Neuromuscular disorder   . Neuromyelitis optica   . Diabetes mellitus without complication   . Hypertension   . Paraplegia   . Sjogren's syndrome   . PE (pulmonary embolism)   . Chronic paraplegia     Medications:  Infusions:  . sodium chloride      Assessment: 63 yof cc fall at home with bilateral patellar and left tibial/right tib-fib fractures. History of PE and paraplegia. Recently started Ciprofloxacin for UTI (started 07/03/15). She reports she already took her Coumadin 5 mg dose today, INR at 1943 was 2.78.  Goal of Therapy:  INR 2-3 Monitor platelets by anticoagulation protocol: Yes   Plan:  INR 2.78, recently started ciprofloxacin, will empirically reduce dose to 2 mg po on Sunday 07/05/15 and recheck INR on Monday 07/06/15.  Carola Frost, Pharm.D. Clinical Pharmacist 07/04/2015,10:52 PM

## 2015-07-04 NOTE — ED Provider Notes (Signed)
[facility] Emergency Department Provider Note  ____________________________________________  Time seen: Approximately 6:48 PM  I have reviewed the triage vital signs and the nursing notes.   HISTORY  Chief Complaint Fall and Leg Injury    HPI Susan Shepherd is a 64 y.o. 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  She presents with bilateral leg injuries after a fall at home yesterday.  She was transferring to her bed with assistance when something happened and she slipped down to the ground.  They tried to lower her gently but her knees were hyperflexed and she impacted on the ground.  She had outpatient x-rays performed (she is part of the pace program) which indicated bilateral fractures so she was sent to the emergency department for further evaluation.  She typically does not have any sensation in her lower extremities but is able to feel severe 8 out of 10 pain bilaterally but worse on the right.  It is much worse with movement.  She did not sustain any injuries to her head or neck and denies any other symptoms as indicated below and her review of systems.   Past Medical History  Diagnosis Date  . Neuromuscular disorder   . Neuromyelitis optica   . Diabetes mellitus without complication   . Hypertension   . Paraplegia   . Sjogren's syndrome   . PE (pulmonary embolism)   . Chronic paraplegia     Patient Active Problem List   Diagnosis Date Noted  . Patellar fracture 07/04/2015  . Fracture of tibia with fibula, right, closed 07/04/2015  . Fracture of tibia, proximal, left, closed 07/04/2015  . History of pulmonary embolism 07/04/2015  . Closed fracture of right tibia and fibula 07/04/2015  . UTI (lower urinary tract infection) 07/04/2015  . Hypertension 04/21/2015  . Urinary retention  04/21/2015  . Acute renal injury 04/18/2015  . Acute pulmonary embolism 04/17/2015  . Pulmonary embolism 04/17/2015  . Diverticulosis of colon with hemorrhage   . Decubitus ulcer, stage I   . Pressure sore   . Chronic diastolic CHF (congestive heart failure)   . Pulmonary hypertension   . LVH (left ventricular hypertrophy)   . Sjogren's disease   . Chronic paraplegia   . Acute GI bleeding 01/08/2015  . Acute blood loss anemia 01/08/2015  . Neuromyelitis optica 01/08/2015  . Essential hypertension 01/08/2015  . Diabetes mellitus type 2, controlled 01/08/2015    Past Surgical History  Procedure Laterality Date  . Eye surgery    . Pulmonary embolism surgery    . Stomach surgery    . Tubal ligation      No current outpatient prescriptions on file.  Allergies Bee venom and Tocilizumab  Family History  Problem Relation Age of Onset  . Adopted: Yes    Social History Social History  Substance Use Topics  . Smoking status: Never Smoker   . Smokeless tobacco: Never Used  . Alcohol Use: No    Review of Systems Constitutional: No fever/chills Eyes: No visual changes. ENT: No sore throat. Cardiovascular: Denies chest pain. Respiratory: Denies shortness of breath. Gastrointestinal: No abdominal pain.  No nausea, no vomiting.  No diarrhea.  No constipation. Genitourinary: Negative for dysuria. Musculoskeletal: pain and fractures of bilateral lower extremities Skin: Negative for rash. Neurological: Negative for headaches, focal weakness or numbness.  10-point ROS otherwise negative.  ____________________________________________  PHYSICAL EXAM:  VITAL SIGNS: ED Triage Vitals  Enc Vitals Group     BP 07/04/15 1645 101/79 mmHg     Pulse Rate 07/04/15 1645 98     Resp 07/04/15 1645 20     Temp 07/04/15 1645 100.1 F (37.8 C)     Temp Source 07/04/15 1645 Oral     SpO2 07/04/15 1645 96 %     Weight 07/04/15 1645 198 lb (89.812 kg)     Height 07/04/15 1645 5\' 3"   (1.6 m)     Head Cir --      Peak Flow --      Pain Score 07/04/15 1646 7     Pain Loc --      Pain Edu? --      Excl. in GC? --     Constitutional: Alert and oriented. Well appearing and in no acute distress. Eyes: Conjunctivae are normal. PERRL. EOMI. Head: Atraumatic. Nose: No congestion/rhinnorhea. Mouth/Throat: Mucous membranes are moist.  Oropharynx non-erythematous. Neck: No stridor.  No cervical spine tenderness to palpation. Cardiovascular: Normal rate, regular rhythm. Grossly normal heart sounds.  Good peripheral circulation. Respiratory: Normal respiratory effort.  No retractions. Lungs CTAB. Gastrointestinal: obese, soft and nontender. No distention. No abdominal bruits. No CVA tenderness. Musculoskeletal: pain and swelling around both of her knees but worse on the right.  Tenderness to palpation of bilateral knees and proximal tibia. Neurologic:  Normal speech and language. Patient has lower extremity paraplegia chronically. Skin:  Skin is warm, dry and intact. No rash noted. Psychiatric: Mood and affect are normal. Speech and behavior are normal.  ____________________________________________   LABS (all labs ordered are listed, but only abnormal results are displayed)  Labs Reviewed  CBC WITH DIFFERENTIAL/PLATELET - Abnormal; Notable for the following:    WBC 3.4 (*)    RBC 3.58 (*)    Hemoglobin 8.7 (*)    HCT 27.8 (*)    MCV 77.7 (*)    MCH 24.3 (*)    MCHC 31.3 (*)    RDW 20.3 (*)    Lymphs Abs 0.8 (*)    All other components within normal limits  PROTIME-INR - Abnormal; Notable for the following:    Prothrombin Time 29.4 (*)    All other components within normal limits  APTT - Abnormal; Notable for the following:    aPTT 78 (*)    All other components within normal limits  COMPREHENSIVE METABOLIC PANEL - Abnormal; Notable for the following:    Calcium 8.5 (*)    Total Protein 6.0 (*)    Albumin 3.1 (*)    AST 49 (*)    Total Bilirubin 1.3 (*)     All other components within normal limits  URINALYSIS COMPLETEWITH MICROSCOPIC ([facility] ONLY) - Abnormal; Notable for the following:    Color, Urine AMBER (*)    APPearance HAZY (*)    Ketones, ur TRACE (*)    Leukocytes, UA 3+ (*)    Squamous Epithelial / LPF 0-5 (*)    All other components within normal limits  URINE CULTURE  GLUCOSE, CAPILLARY  PROTIME-INR  BASIC METABOLIC PANEL  CBC  HEMOGLOBIN A1C   ____________________________________________  EKG  Not indicated ____________________________________________  RADIOLOGY I, Lorilee Cafarella, personally viewed and evaluated these images (plain radiographs) as part of my medical decision making.   Dg Tibia/fibula Left  07/04/2015   CLINICAL DATA:  Status post fall with pain  EXAM: LEFT TIBIA AND FIBULA - 2 VIEW  COMPARISON:  None.  FINDINGS: There is displaced fracture of the patella. There is subtle curvilinear lucency in the proximal tibial shaft suspicious for nondisplaced fracture.  IMPRESSION: There is displaced fracture of the patella. There is subtle curvilinear lucency in the proximal tibial shaft suspicious for nondisplaced fracture.   Electronically Signed   By: Sherian Rein M.D.   On: 07/04/2015 18:12   Dg Tibia/fibula Right  07/04/2015   CLINICAL DATA:  Fall.  Fracture.  EXAM: RIGHT TIBIA AND FIBULA - 2 VIEW  COMPARISON:  Knee films same day.  FINDINGS: There is an obliquely oriented proximal tibial metaphysis fracture. 1 cortex width lateral displacement of the tibia of measuring 5 mm. There is also a fibular neck fracture which is mildly displaced. Diffuse subcutaneous edema is present in the leg. Osteopenia. There appears to be synostosis in the distal leg from previous trauma.  IMPRESSION: Proximal tibial and fibular metaphysis fractures with mild displacement. Diffuse osteopenia, likely secondary to disuse.   Electronically Signed   By: Andreas Newport M.D.   On: 07/04/2015 18:12   Dg Chest Portable 1 View  07/04/2015    CLINICAL DATA:  Patient with fever. No chest pain or shortness of breath.  EXAM: PORTABLE CHEST - 1 VIEW  COMPARISON:  Chest radiograph 06/10/2011  FINDINGS: Marked cardiomegaly, increased from prior. Scattered linear opacities within the left mid and right lower lung. No pleural effusion or pneumothorax. Regional skeleton is unremarkable.  IMPRESSION: Increased cardiomegaly.  Linear opacity within the left midlung is nonspecific however may represent infection, atelectasis and/or scarring. Short-term radiographic followup is recommended to ensure resolution and exclude the possibility of underlying mass at this location.   Electronically Signed   By: Annia Belt M.D.   On: 07/04/2015 20:32   Dg Knee Ap/lat W/sunrise Left  07/04/2015   CLINICAL DATA:  Status post fall yesterday with knee pain.  EXAM: LEFT KNEE 3 VIEWS  COMPARISON:  None.  FINDINGS: There is fracture of the patella, minimally displaced. There is curvilinear lucency projected in the proximal tibia seen on the lateral view. Further evaluation with dedicated tibia and fibular film is suggested.  IMPRESSION: Fracture of the patella.  There is curvilinear lucency projected in the proximal tibia seen on the lateral view. Further evaluation with dedicated tibia and fibular film is suggested.   Electronically Signed   By: Sherian Rein M.D.   On: 07/04/2015 18:08   Dg Knee Ap/lat W/sunrise Right  07/04/2015   CLINICAL DATA:  Status post fall with knee pain  EXAM: RIGHT KNEE 3 VIEWS  COMPARISON:  None.  FINDINGS: There is displaced fracture of the proximal tibia. There is comminuted displaced fracture proximal fibula. There is a suprapatellar effusion.  IMPRESSION: Fractures of the right tibia and fibula.   Electronically Signed   By: Sherian Rein M.D.   On: 07/04/2015 18:09    ____________________________________________   PROCEDURES  Procedure(s) performed: None  Critical Care performed:  No ____________________________________________   INITIAL IMPRESSION / ASSESSMENT AND PLAN / ED COURSE  Pertinent labs & imaging results that were available during my care of the patient were reviewed by me and considered in my medical decision making (see chart for details).  The patient has bilateral tib-fib fractures and patellar fractures.  I spoke by phone with Dr. Joice Lofts who feels that this should be best managed nonoperatively.  She is requiring multiple doses of pain medication to control the pain and needs to be completely nonweightbearing and needs evaluation in the  hospital.  I have discussed the case with the hospitalist who will admit.  Dr. Victory Dakin, her outpatient doctor, called to discuss the case and pointed out that though the patient's hemoglobin is typically in the eights and nines in our records, she reportedly had an outpatient hemoglobin 2 days ago 14.  Today it is back at 8.7 but the patient is completely asymptomatic with no lightheadedness, dizziness, nor tachycardia. Discussed this with the patient and she denies seeing any blood in her urine or in her stool which is more than usual, but she does state that she always has a little bit of blood in her stool since her prior episodes of GI bleeding requiring embolization.  I passed along the message from Dr. Victory Dakin to the hospitalist and he will decide how to proceed, likely with serial H&H's.  Additionally, the patient has a low-grade fever with no respiratory distress, no hypoxemia, no tachycardia, and a slight lucency on her chest x-ray.I did not originally see her urinalysis because it was not available at the time of admission, but subsequently came back infected.  The hospitalist is Artie ordered a urine culture and antibiotics.  ____________________________________________  FINAL CLINICAL IMPRESSION(S) / ED DIAGNOSES  Final diagnoses:  Bilateral tibial fractures, closed, initial encounter  Patellar fracture, right,  closed, initial encounter  Patellar fracture, left, closed, initial encounter  Urinary tract infection without hematuria, site unspecified      NEW MEDICATIONS STARTED DURING THIS VISIT:  Current Discharge Medication List       Loleta Rose, MD 07/05/15 [phone]

## 2015-07-04 NOTE — ED Notes (Signed)
Pt straight cathed - pt usually self caths but we did not have kit. Output 250cc. Did not keep sample, as I did not realize sample not received.

## 2015-07-05 LAB — BASIC METABOLIC PANEL
Anion gap: 4 — ABNORMAL LOW (ref 5–15)
BUN: 14 mg/dL (ref 6–20)
CALCIUM: 7.9 mg/dL — AB (ref 8.9–10.3)
CO2: 28 mmol/L (ref 22–32)
CREATININE: 0.7 mg/dL (ref 0.44–1.00)
Chloride: 110 mmol/L (ref 101–111)
GFR calc non Af Amer: >60 mL/min (ref >60)
GLUCOSE: 106 mg/dL — AB (ref 65–99)
Potassium: 3.8 mmol/L (ref 3.5–5.1)
Sodium: 142 mmol/L (ref 135–145)

## 2015-07-05 LAB — PROTIME-INR
INR: 3.24
PROTHROMBIN TIME: 33.1 s — AB (ref 11.4–15.0)

## 2015-07-05 LAB — CBC
HCT: 25 % — ABNORMAL LOW (ref 35.0–47.0)
Hemoglobin: 7.6 g/dL — ABNORMAL LOW (ref 12.0–16.0)
MCH: 24 pg — AB (ref 26.0–34.0)
MCHC: 30.5 g/dL — AB (ref 32.0–36.0)
MCV: 78.6 fL — ABNORMAL LOW (ref 80.0–100.0)
PLATELETS: 132 10*3/uL — AB (ref 150–440)
RBC: 3.18 MIL/uL — ABNORMAL LOW (ref 3.80–5.20)
RDW: 20.7 % — ABNORMAL HIGH (ref 11.5–14.5)
WBC: 3 10*3/uL — ABNORMAL LOW (ref 3.6–11.0)

## 2015-07-05 LAB — GLUCOSE, CAPILLARY
GLUCOSE-CAPILLARY: 141 mg/dL — AB (ref 65–99)
Glucose-Capillary: 78 mg/dL (ref 65–99)
Glucose-Capillary: 141 mg/dL — ABNORMAL HIGH (ref 65–99)
Glucose-Capillary: 151 mg/dL — ABNORMAL HIGH (ref 65–99)

## 2015-07-05 LAB — CK: Total CK: 60 U/L (ref 38–234)

## 2015-07-05 LAB — HEMOGLOBIN A1C: HEMOGLOBIN A1C: 5.9 % (ref 4.0–6.0)

## 2015-07-05 MED ORDER — LEVOFLOXACIN 250 MG PO TABS
250.0000 mg | ORAL_TABLET | Freq: Every day | ORAL | Status: DC
  Administered 2015-07-05 – 2015-07-06 (×2): 250 mg via ORAL
  Filled 2015-07-05 (×2): qty 1

## 2015-07-05 NOTE — Consult Note (Signed)
ORTHOPAEDIC CONSULTATION  REQUESTING PHYSICIAN: Katha Hamming, MD  Chief Complaint:   Bilateral lower extremity injuries.  History of Present Illness: Susan Shepherd is a 64 y.o. 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 Apparently, she has been able to function independently at home with strong support services. She performs self-catheterizations and performs a daily bowel regimen at home. She is non-ambulatory and gets around in a wheelchair. She is not able to perform any stand to transfers either. Apparently, yesterday while being transferred from her bed to her wheelchair with assistance, she apparently went down to her knees slowly, causing both knees to flex significantly as her feet were caught underneath her. She was brought to the emergency room where x-rays demonstrated the injuries described below. The patient denies any loss of consciousness, lightheadedness, or other symptoms that may have predisposed her to this "fall".  Past Medical History  Diagnosis Date  . Neuromuscular disorder   . Neuromyelitis optica   . Diabetes mellitus without complication   . Hypertension   . Paraplegia   . Sjogren's syndrome   . PE (pulmonary embolism)   . Chronic paraplegia    Past Surgical History  Procedure Laterality Date  . Eye surgery    . Pulmonary embolism surgery    . Stomach surgery    . Tubal ligation     Social History   Social History  . Marital Status: Single    Spouse Name: N/A  . Number of Children: N/A  . Years of Education: N/A   Social History Main Topics  . Smoking status: Never Smoker   . Smokeless tobacco: Never Used  . Alcohol Use: No  . Drug Use: No  . Sexual Activity: Not Asked   Other Topics Concern  . None   Social History Narrative   Family History  Problem Relation Age  of Onset  . Adopted: Yes   Allergies  Allergen Reactions  . Bee Venom Anaphylaxis  . Tocilizumab    Prior to Admission medications   Medication Sig Start Date End Date Taking? Authorizing Provider  acetaminophen (TYLENOL) 325 MG tablet Take 650 mg by mouth daily as needed.   Yes Historical Provider, MD  azaTHIOprine (IMURAN) 50 MG tablet Take 100 mg by mouth 2 (two) times daily.    Yes Historical Provider, MD  baclofen (LIORESAL) 10 MG tablet Take 10 mg by mouth 3 (three) times daily.    Yes Historical Provider, MD  calcium carbonate (OS-CAL - DOSED IN MG OF ELEMENTAL CALCIUM) 1250 (500 CA) MG tablet Take 1 tablet by mouth daily with breakfast.   Yes Historical Provider, MD  carvedilol (COREG) 3.125 MG tablet Take 6.25 mg by mouth 2 (two) times daily with a meal.    Yes Historical Provider, MD  cholecalciferol (VITAMIN D) 1000 UNITS tablet Take 1,000 Units by mouth daily.   Yes Historical Provider, MD  ciprofloxacin (CIPRO) 500 MG tablet Take 500 mg by mouth 2 (two) times daily. 07/03/15 07/07/15 Yes Historical Provider, MD  EPINEPHrine 0.3 mg/0.3 mL IJ SOAJ injection Inject 0.3 mg into the muscle once.   Yes Historical Provider, MD  gabapentin (NEURONTIN) 300 MG capsule Take 600 mg by mouth at bedtime.   Yes Historical Provider, MD  gabapentin (NEURONTIN) 400 MG capsule Take 400 mg by mouth 2 (two) times daily. At 9:am and noon   Yes Historical Provider, MD  HYDROcodone-acetaminophen (NORCO/VICODIN) 5-325 MG per tablet  Take 1 tablet by mouth 3 (three) times daily.   Yes Historical Provider, MD  metFORMIN (GLUCOPHAGE) 500 MG tablet Take 500 mg by mouth daily with breakfast.    Yes Historical Provider, MD  mirabegron ER (MYRBETRIQ) 50 MG TB24 tablet Take 50 mg by mouth at bedtime.   Yes Historical Provider, MD  omeprazole (PRILOSEC) 40 MG capsule Take 40 mg by mouth daily.   Yes Historical Provider, MD  predniSONE (DELTASONE) 10 MG tablet Take 10 mg by mouth daily with breakfast.    Yes  Historical Provider, MD  senna (SENOKOT) 8.6 MG TABS tablet Take 2 tablets by mouth at bedtime as needed for mild constipation or moderate constipation.   Yes Historical Provider, MD  solifenacin (VESICARE) 10 MG tablet Take 10 mg by mouth daily.   Yes Historical Provider, MD  tiZANidine (ZANAFLEX) 2 MG tablet Take 2 mg by mouth 3 (three) times daily. Take 6mg  at 8, and noon, then 4mg  at 6:pm   Yes Historical Provider, MD  vitamin C (ASCORBIC ACID) 500 MG tablet Take 500 mg by mouth 2 (two) times daily.   Yes Historical Provider, MD  warfarin (COUMADIN) 10 MG tablet Take 20 mg by mouth daily at 6 PM.   Yes Historical Provider, MD  warfarin (COUMADIN) 10 MG tablet Take 10 mg by mouth every Saturday at 6 PM.   Yes Historical Provider, MD  alendronate (FOSAMAX) 70 MG tablet Take 70 mg by mouth once a week. Saturday. Take with a full glass of water on an empty stomach.    Historical Provider, MD  warfarin (COUMADIN) 5 MG tablet Take 7.5mg  (1.5 tablets) daily until Friday 6/10 then as directed by Dr. Purcell Mouton. Patient not taking: Reported on 07/04/2015 04/22/15   Bernadene Person, NP   Dg Tibia/fibula Left  07/04/2015   CLINICAL DATA:  Status post fall with pain  EXAM: LEFT TIBIA AND FIBULA - 2 VIEW  COMPARISON:  None.  FINDINGS: There is displaced fracture of the patella. There is subtle curvilinear lucency in the proximal tibial shaft suspicious for nondisplaced fracture.  IMPRESSION: There is displaced fracture of the patella. There is subtle curvilinear lucency in the proximal tibial shaft suspicious for nondisplaced fracture.   Electronically Signed   By: Sherian Rein M.D.   On: 07/04/2015 18:12   Dg Tibia/fibula Right  07/04/2015   CLINICAL DATA:  Fall.  Fracture.  EXAM: RIGHT TIBIA AND FIBULA - 2 VIEW  COMPARISON:  Knee films same day.  FINDINGS: There is an obliquely oriented proximal tibial metaphysis fracture. 1 cortex width lateral displacement of the tibia of measuring 5 mm. There is also a  fibular neck fracture which is mildly displaced. Diffuse subcutaneous edema is present in the leg. Osteopenia. There appears to be synostosis in the distal leg from previous trauma.  IMPRESSION: Proximal tibial and fibular metaphysis fractures with mild displacement. Diffuse osteopenia, likely secondary to disuse.   Electronically Signed   By: Andreas Newport M.D.   On: 07/04/2015 18:12   Dg Chest Portable 1 View  07/04/2015   CLINICAL DATA:  Patient with fever. No chest pain or shortness of breath.  EXAM: PORTABLE CHEST - 1 VIEW  COMPARISON:  Chest radiograph 06/10/2011  FINDINGS: Marked cardiomegaly, increased from prior. Scattered linear opacities within the left mid and right lower lung. No pleural effusion or pneumothorax. Regional skeleton is unremarkable.  IMPRESSION: Increased cardiomegaly.  Linear opacity within the left midlung is nonspecific however may represent infection, atelectasis and/or  scarring. Short-term radiographic followup is recommended to ensure resolution and exclude the possibility of underlying mass at this location.   Electronically Signed   By: Annia Belt M.D.   On: 07/04/2015 20:32   Dg Knee Ap/lat W/sunrise Left  07/04/2015   CLINICAL DATA:  Status post fall yesterday with knee pain.  EXAM: LEFT KNEE 3 VIEWS  COMPARISON:  None.  FINDINGS: There is fracture of the patella, minimally displaced. There is curvilinear lucency projected in the proximal tibia seen on the lateral view. Further evaluation with dedicated tibia and fibular film is suggested.  IMPRESSION: Fracture of the patella.  There is curvilinear lucency projected in the proximal tibia seen on the lateral view. Further evaluation with dedicated tibia and fibular film is suggested.   Electronically Signed   By: Sherian Rein M.D.   On: 07/04/2015 18:08   Dg Knee Ap/lat W/sunrise Right  07/04/2015   CLINICAL DATA:  Status post fall with knee pain  EXAM: RIGHT KNEE 3 VIEWS  COMPARISON:  None.  FINDINGS: There is  displaced fracture of the proximal tibia. There is comminuted displaced fracture proximal fibula. There is a suprapatellar effusion.  IMPRESSION: Fractures of the right tibia and fibula.   Electronically Signed   By: Sherian Rein M.D.   On: 07/04/2015 18:09    Positive ROS: All other systems have been reviewed and were otherwise negative with the exception of those mentioned in the HPI and as above.  Physical Exam: General:  Alert, no acute distress Psychiatric:  Patient is competent for consent with normal mood and affect   Cardiovascular:  No pedal edema Respiratory:  No wheezing, non-labored breathing GI:  Abdomen is soft and non-tender Skin:  No lesions in the area of chief complaint Neurologic:  Sensation intact distally Lymphatic:  No axillary or cervical lymphadenopathy  Orthopedic Exam:  Orthopedic examination is limited to both lower extremities. The right lower extremity demonstrates a 2+ effusion of the right knee with mild-moderate tenderness diffusely around the knee. She has more moderate to severe tenderness to palpation over the proximal tibia. There appears to be some mild ecchymosis over the anterolateral aspect of the proximal portion of the right lower leg. The posterior and lateral compartments are soft. The anterior compartment is slightly firmer but still soft. Sensation is minimally present subjectively diffusely over both the medial and lateral aspects of her right lower extremity and foot. She has no active motor function of any of the muscles of her right lower extremity. She has 1+ dorsalis pedis pulse and good capillary refill to her right foot.  The left lower extremity also demonstrates a 2+ effusion of the left knee with moderate to severe tenderness to palpation over the patellar region anteriorly. She also has moderate tenderness to palpation over the proximal tibia anteriorly. There is no tenseness of the posterior, lateral, or anterior compartments of the left  lower extremity. She has no active motor function of any of the muscles of her left lower extremity. Subjectively, she notes minimally present sensation to light touch diffusely over all aspects of the left lower extremity. She has a 1-2+ dorsalis pedis pulse and good capillary refill to her left foot.  X-rays:  X-rays of the right knee and right tibia and fibula are available for review. These films demonstrate essentially nondisplaced fractures of the proximal tibia and proximal fibula. The proximal tibial fracture is a long oblique fracture which appears to be well aligned in both the AP and lateral  projections, while the proximal fibular fracture is more transverse and essentially nondisplaced. Other than diffuse disuse osteopenia, no other abnormalities are noted.  X-rays of the left knee and left tibia and fibula also are available for review. These films demonstrate an essentially nondisplaced transverse fracture of the patella which runs obliquely posteriorly and distally on the lateral view and therefore does not affect the articular surface of the patella. These films also demonstrate a nondisplaced crack/fracture in the proximal tibial metaphysis only seen on the lateral view. Again, diffuse disuse osteopenia is noted throughout bones visualized. No other abnormalities are identified.  Assessment: 1. Status post essentially nondisplaced right proximal tibia fibular fractures in nonambulatory paraplegic patient. 2. Status post essentially nondisplaced patella fracture and nondisplaced proximal tibial metaphyseal fracture in nonambulatory paraplegic patient.   Plan: The treatment options are discussed with the patient and Dr. Purcell Mouton, her primary care provider, who was at the bedside. Given that she is nonambulatory and the fractures are satisfactorily aligned, I do not feel that surgical intervention is necessary at this time. I feel that these fractures can be managed nonsurgically with knee  immobilizers and tincture of time. She needs to remain nonweightbearing on both legs. She also is to receive appropriate pain medication. She has been independent at home with strong support. It is possible that she may be able to return home in this fashion with her knee immobilizers, so long as she can continue to straight catheter herself and perform her bowel regimen. These things will need to be assessed while she is in the hospital to determine if she can go home or would need short-term rehabilitation placement.  At this point, I do not see any evidence for compartment syndrome of either lower extremity, but the nursing staff has been made aware that they will have to monitor this closely.  Thank you for asking me to participate in the care of this most pleasant, yet unfortunate woman. I will be happy to follow her with you.   Maryagnes Amos, MD  Beeper #:  [phone]  07/05/2015 2:00 PM

## 2015-07-05 NOTE — Progress Notes (Signed)
Informed Dr. Betti Cruz that pt's HGB dropped from 8.7 to 7.6. No active bleeding noted. Informed MD pt did have a fall and has two fractures in that right lower extremity. MD wants pt monitored and inform rounding MD this AM.

## 2015-07-05 NOTE — Progress Notes (Signed)
[facility] [place] [facility]   PATIENT NAME: Susan Shepherd    MR#:  [id number]  DATE OF BIRTH:  1950/12/20  SUBJECTIVE: Did for fall and the noted to have a right tibia-fibula fracture, patient seen today. Complaints of pain in the right leg , patient has good dorsalis pedis pulse. No tightness in the calves. Alert and oriented.   CHIEF COMPLAINT:   Chief Complaint  Patient presents with  . Fall  . Leg Injury    R Leg    REVIEW OF SYSTEMS:    Review of Systems  Constitutional: Negative for fever and chills.  HENT: Negative for hearing loss.   Eyes: Negative for blurred vision, double vision and photophobia.  Respiratory: Negative for cough, hemoptysis and shortness of breath.   Cardiovascular: Negative for palpitations, orthopnea and leg swelling.  Gastrointestinal: Negative for vomiting, abdominal pain and diarrhea.  Genitourinary: Negative for dysuria and urgency.  Musculoskeletal: Negative for myalgias and neck pain.       Right leg pain present  Skin: Negative for rash.  Neurological: Negative for dizziness, focal weakness, seizures, weakness and headaches.  Psychiatric/Behavioral: Negative for memory loss. The patient does not have insomnia.     Nutrition:  Tolerating Diet: Tolerating PT:      DRUG ALLERGIES:   Allergies  Allergen Reactions  . Bee Venom Anaphylaxis  . Tocilizumab     VITALS:  Blood pressure 118/71, pulse 52, temperature 99.1 F (37.3 C), temperature source Oral, resp. rate 18, height  (1.6 m), weight 92.443 kg (203 lb 12.8 oz), SpO2 100 %.  PHYSICAL EXAMINATION:   Physical Exam  GENERAL:  64 y.o.-year-old patient lying in the bed with no acute distress.  EYES: Pupils equal, round, reactive to light and accommodation. No scleral icterus. Extraocular muscles intact.  HEENT: Head atraumatic, normocephalic. Oropharynx and nasopharynx clear.  NECK:  Supple, no jugular venous distention. No thyroid  enlargement, no tenderness.  LUNGS: Normal breath sounds bilaterally, no wheezing, rales,rhonchi or crepitation. No use of accessory muscles of respiration.  CARDIOVASCULAR: S1, S2 normal. No murmurs, rubs, or gallops.  ABDOMEN: Soft, nontender, nondistended. Bowel sounds present. No organomegaly or mass.  EXTREMITIES: Right leg cast present , Intact the dorsalis pedis pulse bilaterally.  NEUROLOGIC: Cranial nerves II through XII are intact. Muscle strength 5/5 in all extremities. Sensation intact. Gait not checked.  PSYCHIATRIC: The patient is alert and oriented x 3.  SKIN: No obvious rash, lesion, or ulcer.    LABORATORY PANEL:   CBC  Recent Labs Lab 07/05/15 0350  WBC 3.0*  HGB 7.6*  HCT 25.0*  PLT 132*   ------------------------------------------------------------------------------------------------------------------  Chemistries   Recent Labs Lab 07/04/15 1943 07/05/15 0350  NA 140 142  K 4.6 3.8  CL 108 110  CO2 23 28  GLUCOSE 87 106*  BUN 17 14  CREATININE 0.62 0.70  CALCIUM 8.5* 7.9*  AST 49*  --   ALT 44  --   ALKPHOS 47  --   BILITOT 1.3*  --    ------------------------------------------------------------------------------------------------------------------  Cardiac Enzymes No results for input(s): TROPONINI in the last 168 hours. ------------------------------------------------------------------------------------------------------------------  RADIOLOGY:  Dg Tibia/fibula Left  07/04/2015   CLINICAL DATA:  Status post fall with pain  EXAM: LEFT TIBIA AND FIBULA - 2 VIEW  COMPARISON:  None.  FINDINGS: There is displaced fracture of the patella. There is subtle curvilinear lucency in the proximal tibial shaft suspicious for nondisplaced fracture.  IMPRESSION: There is displaced fracture  of the patella. There is subtle curvilinear lucency in the proximal tibial shaft suspicious for nondisplaced fracture.   Electronically Signed   By: Sherian Rein M.D.   On:  07/04/2015 18:12   Dg Tibia/fibula Right  07/04/2015   CLINICAL DATA:  Fall.  Fracture.  EXAM: RIGHT TIBIA AND FIBULA - 2 VIEW  COMPARISON:  Knee films same day.  FINDINGS: There is an obliquely oriented proximal tibial metaphysis fracture. 1 cortex width lateral displacement of the tibia of measuring 5 mm. There is also a fibular neck fracture which is mildly displaced. Diffuse subcutaneous edema is present in the leg. Osteopenia. There appears to be synostosis in the distal leg from previous trauma.  IMPRESSION: Proximal tibial and fibular metaphysis fractures with mild displacement. Diffuse osteopenia, likely secondary to disuse.   Electronically Signed   By: Andreas Newport M.D.   On: 07/04/2015 18:12   Dg Chest Portable 1 View  07/04/2015   CLINICAL DATA:  Patient with fever. No chest pain or shortness of breath.  EXAM: PORTABLE CHEST - 1 VIEW  COMPARISON:  Chest radiograph 06/10/2011  FINDINGS: Marked cardiomegaly, increased from prior. Scattered linear opacities within the left mid and right lower lung. No pleural effusion or pneumothorax. Regional skeleton is unremarkable.  IMPRESSION: Increased cardiomegaly.  Linear opacity within the left midlung is nonspecific however may represent infection, atelectasis and/or scarring. Short-term radiographic followup is recommended to ensure resolution and exclude the possibility of underlying mass at this location.   Electronically Signed   By: Annia Belt M.D.   On: 07/04/2015 20:32   Dg Knee Ap/lat W/sunrise Left  07/04/2015   CLINICAL DATA:  Status post fall yesterday with knee pain.  EXAM: LEFT KNEE 3 VIEWS  COMPARISON:  None.  FINDINGS: There is fracture of the patella, minimally displaced. There is curvilinear lucency projected in the proximal tibia seen on the lateral view. Further evaluation with dedicated tibia and fibular film is suggested.  IMPRESSION: Fracture of the patella.  There is curvilinear lucency projected in the proximal tibia seen on  the lateral view. Further evaluation with dedicated tibia and fibular film is suggested.   Electronically Signed   By: Sherian Rein M.D.   On: 07/04/2015 18:08   Dg Knee Ap/lat W/sunrise Right  07/04/2015   CLINICAL DATA:  Status post fall with knee pain  EXAM: RIGHT KNEE 3 VIEWS  COMPARISON:  None.  FINDINGS: There is displaced fracture of the proximal tibia. There is comminuted displaced fracture proximal fibula. There is a suprapatellar effusion.  IMPRESSION: Fractures of the right tibia and fibula.   Electronically Signed   By: Sherian Rein M.D.   On: 07/04/2015 18:09     ASSESSMENT AND PLAN:   Principal Problem:   Fracture of tibia with fibula, right, closed Active Problems:   Neuromyelitis optica   Essential hypertension   Diabetes mellitus type 2, controlled   Urinary retention   Patellar fracture   Fracture of tibia, proximal, left, closed   History of pulmonary embolism   UTI (lower urinary tract infection)   #1 history of fall with fracture of fibula and tibia on the right side: Right leg cast present, patient will be seen by orthopedic. Primary care physician was concerned about acute compartment syndrome but it's unlikely because patient has no significant pain out of proportion, no pallor, compartments are not tender. Continue Lyrica and monitor compartment pressures needed, we'll check the CK. Patient has weakness and paraplegia from before.  #2  left patellar fracture: Continue pain medication, orthopedic consult appreciated. #3 history of PE: Coumadin is therapeutic INR is therapeutic continue Coumadin. Hemoglobin did drop a little bit if but patient has no evidence of gross bleeding hemoglobin dropped and be dilutional.. #4  diabetes mellitus type 2 controlled; continue metformin, insulin with sliding scale coverage. Patient requested a low-sodium diet consider diabetic diet even though she is diabetic. 5. neuromyelitis optica with the arm history of urinary retention, ;  in and out catheter. Continue azathioprine., Continue Cipro for a UTIs, continue Myrbetriq for the overactive bladder.  All the records are reviewed and case discussed with Care Management/Social Workerr. Management plans discussed with the patient, family and they are in agreement.  CODE STATUS: Full  TOTAL TIME TAKING CARE OF THIS PATIENT: .   POSSIBLE D/C IN 2-3 DAYS DAYS, DEPENDING ON CLINICAL CONDITION.   Katha Hamming M.D on 07/05/2015 at 9:29 AM  Between 7am to 6pm - Pager - [phone]  After 6pm go to [email or web address] - password [email or web address] [facility]  [facility]  Office  [phone]  CC: Primary care physician; No primary care provider on file.

## 2015-07-05 NOTE — Progress Notes (Signed)
ANTICOAGULATION CONSULT NOTE -Follow up Consult  Pharmacy Consult for warfarin Indication: history of pulmonary embolus  Allergies  Allergen Reactions  . Bee Venom Anaphylaxis  . Tocilizumab     Patient Measurements: Height:  (160 cm) Weight: 203 lb 12.8 oz (92.443 kg) IBW/kg (Calculated) : 52.4 Heparin Dosing Weight:   Vital Signs: Temp: 99.1 F (37.3 C) (08/21 0736) Temp Source: Oral (08/21 0736) BP: 118/71 mmHg (08/21 0736) Pulse Rate: 52 (08/21 0736)  Labs:  Recent Labs  07/04/15 1943 07/05/15 0350  HGB 8.7* 7.6*  HCT 27.8* 25.0*  PLT 174 132*  APTT 78*  --   LABPROT 29.4* 33.1*  INR 2.78 3.24  CREATININE 0.62 0.70    Estimated Creatinine Clearance: 77.7 mL/min (by C-G formula based on Cr of 0.7).   Medical History: Past Medical History  Diagnosis Date  . Neuromuscular disorder   . Neuromyelitis optica   . Diabetes mellitus without complication   . Hypertension   . Paraplegia   . Sjogren's syndrome   . PE (pulmonary embolism)   . Chronic paraplegia     Medications:  Infusions:     Assessment: 63 yof cc fall at home with bilateral patellar and left tibial/right tib-fib fractures. History of PE and paraplegia. Recently started Ciprofloxacin for UTI (started 07/03/15). She reports she already took her Coumadin 5 mg dose today 8/20, INR at 1943 was 2.78.  Goal of Therapy:  INR 2-3 Monitor platelets by anticoagulation protocol: Yes  8/20:  Coumadin 5 mg (at home) 8/21: Hold dose   Plan:  8/20: INR 2.78, recently started ciprofloxacin, will empirically reduce dose to 2 mg po on Sunday 07/05/15 and recheck INR on Monday 07/06/15.  8/21: INR= 3.24. Will hold dose today. Anticipate resuming on 8/22 Monday with lower dose if INR 2.0-3.0. Drug interaction with Cipro. Recheck INR in am  Lot Medford A, Pharm.D. Clinical Pharmacist 07/05/2015,9:51 AM

## 2015-07-05 NOTE — Progress Notes (Signed)
Patient's primary care provider also called Dr. Terri Skains and voiced concerns about pt hgb drop and worreis about bleeding into that leg and having compartment syndrome. Will pass on to rounding MD and told Dr. Kyung Rudd this information.

## 2015-07-06 LAB — GLUCOSE, CAPILLARY
GLUCOSE-CAPILLARY: 102 mg/dL — AB (ref 65–99)
Glucose-Capillary: 94 mg/dL (ref 65–99)
Glucose-Capillary: 111 mg/dL — ABNORMAL HIGH (ref 65–99)
Glucose-Capillary: 125 mg/dL — ABNORMAL HIGH (ref 65–99)

## 2015-07-06 LAB — CBC
HCT: 25.4 % — ABNORMAL LOW (ref 35.0–47.0)
HEMOGLOBIN: 7.7 g/dL — AB (ref 12.0–16.0)
MCH: 24 pg — ABNORMAL LOW (ref 26.0–34.0)
MCHC: 30.1 g/dL — ABNORMAL LOW (ref 32.0–36.0)
MCV: 79.6 fL — ABNORMAL LOW (ref 80.0–100.0)
Platelets: 136 10*3/uL — ABNORMAL LOW (ref 150–440)
RBC: 3.2 MIL/uL — AB (ref 3.80–5.20)
RDW: 20.7 % — ABNORMAL HIGH (ref 11.5–14.5)
WBC: 2.5 10*3/uL — AB (ref 3.6–11.0)

## 2015-07-06 LAB — URINE CULTURE
Culture: NO GROWTH
Special Requests: NORMAL

## 2015-07-06 LAB — PROTIME-INR
INR: 4.12 — AB
PROTHROMBIN TIME: 39.8 s — AB (ref 11.4–15.0)

## 2015-07-06 MED ORDER — LEVOFLOXACIN 250 MG PO TABS: 250.0000 mg | ORAL_TABLET | Freq: Every day | ORAL | Status: AC

## 2015-07-06 MED ORDER — HYDROCODONE-ACETAMINOPHEN 5-325 MG PO TABS: 1.0000 | ORAL_TABLET | ORAL | Status: AC | PRN

## 2015-07-06 MED ORDER — WARFARIN SODIUM 10 MG PO TABS: 5.0000 mg | ORAL_TABLET | ORAL | Status: DC

## 2015-07-06 NOTE — Progress Notes (Signed)
[facility] - [place] at [facility]   PATIENT NAME: Susan Shepherd    MR#:  [id number]  DATE OF BIRTH:  [birth year]  SUBJECTIVE: Admitted for fall ,has tibia and fibula fractures and left patella pfracture.pain is present in both legs but better than yesterday.INR 4.,constipation present.trying to do self cath,due to her elevated INR ortho recommends keeping her one more day and discharge to rehab tomorrow,  CHIEF COMPLAINT:   Chief Complaint  Patient presents with  . Fall  . Leg Injury    R Leg    REVIEW OF SYSTEMS:    Review of Systems  Constitutional: Negative for fever and chills.  HENT: Negative for hearing loss.   Eyes: Negative for blurred vision, double vision and photophobia.  Respiratory: Negative for cough, hemoptysis and shortness of breath.   Cardiovascular: Negative for palpitations, orthopnea and leg swelling.  Gastrointestinal: Negative for vomiting, abdominal pain and diarrhea.  Genitourinary: Negative for dysuria and urgency.  Musculoskeletal: Negative for myalgias and neck pain.       Right leg pain present  Skin: Negative for rash.  Neurological: Negative for dizziness, focal weakness, seizures, weakness and headaches.  Psychiatric/Behavioral: Negative for memory loss. The patient does not have insomnia.     Nutrition:  Tolerating Diet: Tolerating PT:      DRUG ALLERGIES:   Allergies  Allergen Reactions  . Bee Venom Anaphylaxis  . Tocilizumab     VITALS:  Blood pressure 152/92, pulse 84, temperature 98.2 F (36.8 C), temperature source Oral, resp. rate 16, height  (1.6 m), weight 92.443 kg (203 lb 12.8 oz), SpO2 95 %.  PHYSICAL EXAMINATION:   Physical Exam  GENERAL:  64 y.o.-year-old patient lying in the bed with no acute distress.  EYES: Pupils equal, round, reactive to light and accommodation. No scleral icterus. Extraocular muscles intact.  HEENT: Head atraumatic, normocephalic. Oropharynx and  nasopharynx clear.  NECK:  Supple, no jugular venous distention. No thyroid enlargement, no tenderness.  LUNGS: Normal breath sounds bilaterally, no wheezing, rales,rhonchi or crepitation. No use of accessory muscles of respiration.  CARDIOVASCULAR: S1, S2 normal. No murmurs, rubs, or gallops.  ABDOMEN: Soft, nontender, nondistended. Bowel sounds present. No organomegaly or mass.  EXTREMITIES: Right leg cast present , Intact the dorsalis pedis pulse bilaterally.  NEUROLOGIC: Cranial nerves II through XII are intact. Muscle strength 5/5 in all extremities. Sensation intact. Gait not checked.  PSYCHIATRIC: The patient is alert and oriented x 3.  SKIN: No obvious rash, lesion, or ulcer.    LABORATORY PANEL:   CBC  Recent Labs Lab 07/06/15 0301  WBC 2.5*  HGB 7.7*  HCT 25.4*  PLT 136*   ------------------------------------------------------------------------------------------------------------------  Chemistries   Recent Labs Lab 07/04/15 1943 07/05/15 0350  NA 140 142  K 4.6 3.8  CL 108 110  CO2 23 28  GLUCOSE 87 106*  BUN 17 14  CREATININE 0.62 0.70  CALCIUM 8.5* 7.9*  AST 49*  --   ALT 44  --   ALKPHOS 47  --   BILITOT 1.3*  --    ------------------------------------------------------------------------------------------------------------------  Cardiac Enzymes No results for input(s): TROPONINI in the last 168 hours. ------------------------------------------------------------------------------------------------------------------  RADIOLOGY:  Dg Tibia/fibula Left  07/04/2015   CLINICAL DATA:  Status post fall with pain  EXAM: LEFT TIBIA AND FIBULA - 2 VIEW  COMPARISON:  None.  FINDINGS: There is displaced fracture of the patella. There is subtle curvilinear lucency in the proximal tibial shaft suspicious for nondisplaced fracture.  IMPRESSION: There is displaced fracture of the patella. There is subtle curvilinear lucency in the proximal tibial shaft suspicious for  nondisplaced fracture.   Electronically Signed   By: Sherian Rein M.D.   On: 07/04/2015 18:12   Dg Tibia/fibula Right  07/04/2015   CLINICAL DATA:  Fall.  Fracture.  EXAM: RIGHT TIBIA AND FIBULA - 2 VIEW  COMPARISON:  Knee films same day.  FINDINGS: There is an obliquely oriented proximal tibial metaphysis fracture. 1 cortex width lateral displacement of the tibia of measuring 5 mm. There is also a fibular neck fracture which is mildly displaced. Diffuse subcutaneous edema is present in the leg. Osteopenia. There appears to be synostosis in the distal leg from previous trauma.  IMPRESSION: Proximal tibial and fibular metaphysis fractures with mild displacement. Diffuse osteopenia, likely secondary to disuse.   Electronically Signed   By: Andreas Newport M.D.   On: 07/04/2015 18:12   Dg Chest Portable 1 View  07/04/2015   CLINICAL DATA:  Patient with fever. No chest pain or shortness of breath.  EXAM: PORTABLE CHEST - 1 VIEW  COMPARISON:  Chest radiograph 06/10/2011  FINDINGS: Marked cardiomegaly, increased from prior. Scattered linear opacities within the left mid and right lower lung. No pleural effusion or pneumothorax. Regional skeleton is unremarkable.  IMPRESSION: Increased cardiomegaly.  Linear opacity within the left midlung is nonspecific however may represent infection, atelectasis and/or scarring. Short-term radiographic followup is recommended to ensure resolution and exclude the possibility of underlying mass at this location.   Electronically Signed   By: Annia Belt M.D.   On: 07/04/2015 20:32   Dg Knee Ap/lat W/sunrise Left  07/04/2015   CLINICAL DATA:  Status post fall yesterday with knee pain.  EXAM: LEFT KNEE 3 VIEWS  COMPARISON:  None.  FINDINGS: There is fracture of the patella, minimally displaced. There is curvilinear lucency projected in the proximal tibia seen on the lateral view. Further evaluation with dedicated tibia and fibular film is suggested.  IMPRESSION: Fracture of the  patella.  There is curvilinear lucency projected in the proximal tibia seen on the lateral view. Further evaluation with dedicated tibia and fibular film is suggested.   Electronically Signed   By: Sherian Rein M.D.   On: 07/04/2015 18:08   Dg Knee Ap/lat W/sunrise Right  07/04/2015   CLINICAL DATA:  Status post fall with knee pain  EXAM: RIGHT KNEE 3 VIEWS  COMPARISON:  None.  FINDINGS: There is displaced fracture of the proximal tibia. There is comminuted displaced fracture proximal fibula. There is a suprapatellar effusion.  IMPRESSION: Fractures of the right tibia and fibula.   Electronically Signed   By: Sherian Rein M.D.   On: 07/04/2015 18:09     ASSESSMENT AND PLAN:   Principal Problem:   Fracture of tibia with fibula, right, closed Active Problems:   Neuromyelitis optica   Essential hypertension   Diabetes mellitus type 2, controlled   Urinary retention   Patellar fracture   Fracture of tibia, proximal, left, closed   History of pulmonary embolism   UTI (lower urinary tract infection)   #1 history of fall with fracture of fibula and tibia on the right side: Right leg cast present, patient will be seen by orthopedic. Primary care physician was concerned about acute compartment syndrome but it's unlikely because patient has no significant pain out of proportion, no pallor, compartments are not tender. Continue Lyrica and monitor compartment pressures needed, we'll check the CK. Patient has weakness and  paraplegia from before.  #2 left patellar fracture: Continue pain medication, orthopedic consult appreciated. #3 history of PE:  On Coumadin,INR 4 today,Hold coumadin,watch for compartment syndrome,Hemoglobin did drop a little bit if but patient has no evidence of gross bleeding . #4  diabetes mellitus type 2 controlled; continue metformin, insulin with sliding scale coverage. Patient requested a low-sodium diet consider diabetic diet even though she is diabetic. 5. neuromyelitis  optica with the arm history of urinary retention, ; in and out catheter. Continue azathioprine., continue levaquin continue Myrbetriq for the overactive bladder. Hold discharge today due to ongoing pain,constipation,elevated INR, All the records are reviewed and case discussed with Care Management/Social Workerr. Management plans discussed with the patient, family and they are in agreement.  CODE STATUS: Full  TOTAL TIME TAKING CARE OF THIS PATIENT: .   POSSIBLE D/C IN 2-3 DAYS DAYS, DEPENDING ON CLINICAL CONDITION.   Katha Hamming M.D on 07/06/2015 at 11:36 AM  Between 7am to 6pm - Pager - [phone]  After 6pm go to [email or web address] - password [email or web address] [facility]  [place] [facility]  Office  [phone]  CC: Primary care physician; No primary care provider on file.

## 2015-07-06 NOTE — Clinical Social Work Placement (Signed)
   CLINICAL SOCIAL WORK PLACEMENT  NOTE  Date:  07/06/2015  Patient Details  Name: Susan Shepherd MRN: [id number] Date of Birth: 11/24/1950  Clinical Social Work is seeking post-discharge placement for this patient at the Skilled  Nursing Facility level of care (*CSW will initial, date and re-position this form in  chart as items are completed):  Yes   Patient/family provided with [place] Clinical Social Work Department's list of facilities offering this level of care within the geographic area requested by the patient (or if unable, by the patient's family).  Yes   Patient/family informed of their freedom to choose among providers that offer the needed level of care, that participate in Medicare, Medicaid or managed care program needed by the patient, have an available bed and are willing to accept the patient.  Yes   Patient/family informed of [facility]'s ownership interest in [facility] and [facility], as well as of the fact that they are under no obligation to receive care at these facilities.  PASRR submitted to EDS on       PASRR number received on       Existing PASRR number confirmed on 07/06/15     FL2 transmitted to all facilities in geographic area requested by pt/family on 07/06/15     FL2 transmitted to all facilities within larger geographic area on 07/06/15     Patient informed that his/her managed care company has contracts with or will negotiate with certain facilities, including the following:        Yes   Patient/family informed of bed offers received.  Patient chooses bed at  [facility])     Physician recommends and patient chooses bed at      Patient to be transferred to   on  .  Patient to be transferred to facility by       Patient family notified on   of transfer.  Name of family member notified:        PHYSICIAN       Additional Comment:    _______________________________________________ Haig Prophet,  LCSW 07/06/2015, 11:47 AM

## 2015-07-06 NOTE — Plan of Care (Signed)
Problem: Phase I Progression Outcomes Goal: Voiding-avoid urinary catheter unless indicated Outcome: Not Met (add Reason) Pt self caths     

## 2015-07-06 NOTE — Progress Notes (Signed)
ANTICOAGULATION CONSULT NOTE -Follow up Consult  Pharmacy Consult for warfarin Indication: history of pulmonary embolus  Allergies  Allergen Reactions  . Bee Venom Anaphylaxis  . Tocilizumab     Patient Measurements: Height:  (160 cm) Weight: 203 lb 12.8 oz (92.443 kg) IBW/kg (Calculated) : 52.4 Heparin Dosing Weight:   Vital Signs: Temp: 98 F (36.7 C) (08/22 0418) Temp Source: Oral (08/22 0418) BP: 138/91 mmHg (08/22 0418) Pulse Rate: 78 (08/22 0418)  Labs:  Recent Labs  07/04/15 1943 07/05/15 0350 07/05/15 0956 07/06/15 0301  HGB 8.7* 7.6*  --  7.7*  HCT 27.8* 25.0*  --  25.4*  PLT 174 132*  --  136*  APTT 78*  --   --   --   LABPROT 29.4* 33.1*  --  39.8*  INR 2.78 3.24  --  4.12*  CREATININE 0.62 0.70  --   --   CKTOTAL  --   --  60  --     Estimated Creatinine Clearance: 77.7 mL/min (by C-G formula based on Cr of 0.7).   Medical History: Past Medical History  Diagnosis Date  . Neuromuscular disorder   . Neuromyelitis optica   . Diabetes mellitus without complication   . Hypertension   . Paraplegia   . Sjogren's syndrome   . PE (pulmonary embolism)   . Chronic paraplegia     Medications:  Infusions:     Assessment: 63 yof cc fall at home with bilateral patellar and left tibial/right tib-fib fractures. History of PE and paraplegia. Recently started Ciprofloxacin for UTI (started 07/03/15). She reports she already took her Coumadin 5 mg dose today 8/20, INR at 1943 was 2.78.  Goal of Therapy:  INR 2-3 Monitor platelets by anticoagulation protocol: Yes  8/20:  Coumadin 5 mg (at home) 8/21: Hold dose   Plan:  8/20: INR 2.78, recently started ciprofloxacin, will empirically reduce dose to 2 mg po on Sunday 07/05/15 and recheck INR on Monday 07/06/15.  8/21: INR= 3.24. Will hold dose today. Anticipate resuming on 8/22 Monday with lower dose if INR 2.0-3.0. Drug interaction with Cipro. Recheck INR in am  08/22: INR 4.12. Will continue to  hold. Recheck INR in AM.   Carola Frost, Pharm.D. Clinical Pharmacist 07/06/2015,4:38 AM

## 2015-07-06 NOTE — Clinical Social Work Note (Signed)
Clinical Social Work Assessment  Patient Details  Name: Susan Shepherd MRN: [id number] Date of Birth: [birth year]  Date of referral:  07/06/15               Reason for consult:  Facility Placement, Other (Comment Required) ([facility] Patient )                Permission sought to share information with:  Susan Shepherd granted to share information::  Yes, Verbal Permission Granted  Name::      Susan Shepherd::   Susan Shepherd  Relationship::     Contact Information:     Housing/Transportation Living arrangements for the past 2 months:  Susan Shepherd of Information:  Patient Patient Interpreter Needed:  None Criminal Activity/Legal Involvement Pertinent to Current Situation/Hospitalization:  No - Comment as needed Significant Relationships:  Adult Children, Other(Comment) (PACE patient ) Lives with:  Self Do you feel safe going back to the place where you live?  Yes Need for family participation in patient care:  No (Coment)  Care giving concerns: Patient lives in [name] alone and is a [facility] patient.    Social Worker assessment / plan: Holiday representative (CSW) received verbal consult from Chief Strategy Officer for SNF placement. Patient is a PACE patient. CSW met with patient alone at bedside. CSW introduced self and explained role of CSW department. Patient reported that she lives alone in [place] and her daughter [place] lives near by. Patient reported that she is a PACE patient and would like to go to rehab at [facility]. CSW spoke with Actuary at [employer]. Per [facility] patient can go to [facility] and requires EMS for transport. FL2 completed and faxed out. [facility] admissions coordinator at [facility] has extended a bed offer. Plan is for patient to D/C to [facility] tomorrow (07/07/15). MD, PACE, [facility], RN and patient are aware of above. CSW will continue to follow and assist as needed.     Employment status:  Retired Forensic scientist:  Other (Comment Required) (PACE patient. ) PT Recommendations:  Not assessed at this time Information / Referral to community resources:  [place]  Patient/Family's Response to care: Patient is agreeable to going to [facility].   Patient/Family's Understanding of and Emotional Response to Diagnosis, Current Treatment, and Prognosis: Patient was pleasant throughout assessment and thanked CSW for visit.   Emotional Assessment Appearance:  Appears stated age Attitude/Demeanor/Rapport:    Affect (typically observed):  Accepting, Adaptable, Pleasant Orientation:  Oriented to Self, Oriented to  Time, Oriented to Place, Oriented to Situation Alcohol / Substance use:  Not Applicable Psych involvement (Current and /or in the community):  No (Comment)  Discharge Needs  Concerns to be addressed:  Discharge Planning Concerns Readmission within the last 30 days:  No Current discharge risk:  Lives alone Barriers to Discharge:  Continued Medical Work up   [name], LCSW 07/06/2015, 11:52 AM

## 2015-07-06 NOTE — Discharge Summary (Signed)
Susan Shepherd, is a 64 y.o. female  DOB 02-20-1951  MRN [id number].  Admission date:  07/04/2015  Admitting Physician  Oralia Manis, MD  Discharge Date:  07/06/2015   Primary MD  No primary care provider on file.  Recommendations for primary care physician for things to follow:   Follow-up with Dr. Leron Croak  in 1 week.   Admission Diagnosis  Patellar fracture, left, closed, initial encounter [S82.002A] Patellar fracture, right, closed, initial encounter [S82.001A] Bilateral tibial fractures, closed, initial encounter [S82.201A, S82.202A]   Discharge Diagnosis  Patellar fracture, left, closed, initial encounter [S82.002A] Patellar fracture, right, closed, initial encounter [S82.001A] Bilateral tibial fractures, closed, initial encounter [S82.201A, S82.202A]    Principal Problem:   Fracture of tibia with fibula, right, closed Active Problems:   Neuromyelitis optica   Essential hypertension   Diabetes mellitus type 2, controlled   Urinary retention   Patellar fracture   Fracture of tibia, proximal, left, closed   History of pulmonary embolism   UTI (lower urinary tract infection)      Past Medical History  Diagnosis Date  . Neuromuscular disorder   . Neuromyelitis optica   . Diabetes mellitus without complication   . Hypertension   . Paraplegia   . Sjogren's syndrome   . PE (pulmonary embolism)   . Chronic paraplegia     Past Surgical History  Procedure Laterality Date  . Eye surgery    . Pulmonary embolism surgery    . Stomach surgery    . 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 X-rays in the emergency room revealed showed right fibula fracture fibula fracture, left patellar fracture.  Hospital Course  #1 fracture of tibia and fibula on the right side closed fracture, colon patient to has nondisplaced right proximal and tibial fracture. Patient is nonambulatory because of paraplegia. Orthopedics felt that based the contractures does not need any surgical interventions. Patient is placed on immobilizer on both sides. Advised to have nonweightbearing on both legs. The patient does self catheter because of neurogenic bladder but normal because of her recent fractures she may need Foley catheter and needs to go to rehabilitation before she goes home. Patient is going to provide a banana today. And patient is agreeable for that. #2 history of PE on Coumadin; INR  Is  4  today so I decreased the dose of Coumadin and the patient will go  To NH  on Coumadin 6 mg daily instead 10 mg daily. She needs to have INR checked within 1-2 days and adjust the Coumadin.  3.History of forearm neuromyelitis optica patient ; bound and paraplegic continue with  Azathiprine  tizanidine for muscle spasms 4. Diabetes ;type II continue home medications Anemia of chronic disease, probably immunosuppression with Azathiprine ;no indication for transfusion, #5 UTIs ;Cipro has been changed to Levaquin secondary to interaction with the Cipro and the tizanidine. Discharge Condition: Stable   Follow UP      Discharge Instructions  and  Discharge Medications       Medication  List    STOP taking these medications        ciprofloxacin 500 MG tablet  Commonly known as:  CIPRO      TAKE these medications        acetaminophen 325 MG tablet  Commonly known as:  TYLENOL  Take 650 mg by  mouth daily as needed.     alendronate 70 MG tablet  Commonly known as:  FOSAMAX  Take 70 mg by mouth once a week. Saturday. Take with a full glass of water on an empty stomach.     azaTHIOprine 50 MG tablet  Commonly known as:  IMURAN  Take 100 mg by mouth 2 (two) times daily.     baclofen 10 MG tablet  Commonly known as:  LIORESAL  Take 10 mg by mouth 3 (three) times daily.     calcium carbonate 1250 (500 CA) MG tablet  Commonly known as:  OS-CAL - dosed in mg of elemental calcium  Take 1 tablet by mouth daily with breakfast.     carvedilol 3.125 MG tablet  Commonly known as:  COREG  Take 6.25 mg by mouth 2 (two) times daily with a meal.     cholecalciferol 1000 UNITS tablet  Commonly known as:  VITAMIN D  Take 1,000 Units by mouth daily.     EPINEPHrine 0.3 mg/0.3 mL Soaj injection  Commonly known as:  EPI-PEN  Inject 0.3 mg into the muscle once.     gabapentin 400 MG capsule  Commonly known as:  NEURONTIN  Take 400 mg by mouth 2 (two) times daily. At 9:am and noon     gabapentin 300 MG capsule  Commonly known as:  NEURONTIN  Take 600 mg by mouth at bedtime.     HYDROcodone-acetaminophen 5-325 MG per tablet  Commonly known as:  NORCO/VICODIN  Take 1 tablet by mouth 3 (three) times daily.     HYDROcodone-acetaminophen 5-325 MG per tablet  Commonly known as:  NORCO/VICODIN  Take 1-2 tablets by mouth every 4 (four) hours as needed for moderate pain.     levofloxacin 250 MG tablet  Commonly known as:  LEVAQUIN  Take 1 tablet (250 mg total) by mouth daily.     metFORMIN 500 MG tablet  Commonly known as:  GLUCOPHAGE  Take 500 mg by mouth daily with breakfast.     mirabegron ER 50 MG Tb24 tablet  Commonly known as:  MYRBETRIQ  Take 50 mg by mouth at bedtime.     omeprazole 40 MG capsule  Commonly known as:  PRILOSEC  Take 40 mg by mouth daily.     predniSONE 10 MG tablet  Commonly known as:  DELTASONE  Take 10 mg by mouth daily with breakfast.     senna  8.6 MG Tabs tablet  Commonly known as:  SENOKOT  Take 2 tablets by mouth at bedtime as needed for mild constipation or moderate constipation.     solifenacin 10 MG tablet  Commonly known as:  VESICARE  Take 10 mg by mouth daily.     tiZANidine 2 MG tablet  Commonly known as:  ZANAFLEX  Take 2 mg by mouth 3 (three) times daily. Take 6mg  at 8, and noon, then 4mg  at 6:pm     vitamin C 500 MG tablet  Commonly known as:  ASCORBIC ACID  Take 500 mg by mouth 2 (two) times daily.     warfarin 10 MG tablet  Commonly known as:  COUMADIN  Take 0.5 tablets (  5 mg total) by mouth every Saturday at 6 PM.          Diet and Activity recommendation: See Discharge Instructions above   Consults obtained - ortho   Major procedures and Radiology Reports - PLEASE review detailed and final reports for all details, in brief -      Dg Tibia/fibula Left  07/04/2015   CLINICAL DATA:  Status post fall with pain  EXAM: LEFT TIBIA AND FIBULA - 2 VIEW  COMPARISON:  None.  FINDINGS: There is displaced fracture of the patella. There is subtle curvilinear lucency in the proximal tibial shaft suspicious for nondisplaced fracture.  IMPRESSION: There is displaced fracture of the patella. There is subtle curvilinear lucency in the proximal tibial shaft suspicious for nondisplaced fracture.   Electronically Signed   By: Sherian Rein M.D.   On: 07/04/2015 18:12   Dg Tibia/fibula Right  07/04/2015   CLINICAL DATA:  Fall.  Fracture.  EXAM: RIGHT TIBIA AND FIBULA - 2 VIEW  COMPARISON:  Knee films same day.  FINDINGS: There is an obliquely oriented proximal tibial metaphysis fracture. 1 cortex width lateral displacement of the tibia of measuring 5 mm. There is also a fibular neck fracture which is mildly displaced. Diffuse subcutaneous edema is present in the leg. Osteopenia. There appears to be synostosis in the distal leg from previous trauma.  IMPRESSION: Proximal tibial and fibular metaphysis fractures with mild  displacement. Diffuse osteopenia, likely secondary to disuse.   Electronically Signed   By: Andreas Newport M.D.   On: 07/04/2015 18:12   Dg Chest Portable 1 View  07/04/2015   CLINICAL DATA:  Patient with fever. No chest pain or shortness of breath.  EXAM: PORTABLE CHEST - 1 VIEW  COMPARISON:  Chest radiograph 06/10/2011  FINDINGS: Marked cardiomegaly, increased from prior. Scattered linear opacities within the left mid and right lower lung. No pleural effusion or pneumothorax. Regional skeleton is unremarkable.  IMPRESSION: Increased cardiomegaly.  Linear opacity within the left midlung is nonspecific however may represent infection, atelectasis and/or scarring. Short-term radiographic followup is recommended to ensure resolution and exclude the possibility of underlying mass at this location.   Electronically Signed   By: Annia Belt M.D.   On: 07/04/2015 20:32   Dg Knee Ap/lat W/sunrise Left  07/04/2015   CLINICAL DATA:  Status post fall yesterday with knee pain.  EXAM: LEFT KNEE 3 VIEWS  COMPARISON:  None.  FINDINGS: There is fracture of the patella, minimally displaced. There is curvilinear lucency projected in the proximal tibia seen on the lateral view. Further evaluation with dedicated tibia and fibular film is suggested.  IMPRESSION: Fracture of the patella.  There is curvilinear lucency projected in the proximal tibia seen on the lateral view. Further evaluation with dedicated tibia and fibular film is suggested.   Electronically Signed   By: Sherian Rein M.D.   On: 07/04/2015 18:08   Dg Knee Ap/lat W/sunrise Right  07/04/2015   CLINICAL DATA:  Status post fall with knee pain  EXAM: RIGHT KNEE 3 VIEWS  COMPARISON:  None.  FINDINGS: There is displaced fracture of the proximal tibia. There is comminuted displaced fracture proximal fibula. There is a suprapatellar effusion.  IMPRESSION: Fractures of the right tibia and fibula.   Electronically Signed   By: Sherian Rein M.D.   On: 07/04/2015 18:09     Micro Results    Recent Results (from the past 240 hour(s))  Urine culture     Status: None (  Preliminary result)   Collection Time: 07/04/15  7:43 PM  Result Value Ref Range Status   Specimen Description URINE, CLEAN CATCH  Final   Special Requests Normal  Final   Culture NO GROWTH < 12 HOURS  Final   Report Status PENDING  Incomplete       Today   Subjective:   Susan Shepherd today has no headache,no chest abdominal pain,no new weakness tingling or numbness, feels much better wants to go home today.   Objective:   Blood pressure 152/92, pulse 84, temperature 98.2 F (36.8 C), temperature source Oral, resp. rate 16, height  (1.6 m), weight 92.443 kg (203 lb 12.8 oz), SpO2 95 %.   Intake/Output Summary (Last 24 hours) at 07/06/15 0948 Last data filed at 07/06/15 0800  Gross per 24 hour  Intake    480 ml  Output   1600 ml  Net  -1120 ml    Exam Awake Alert, Oriented x 3, No new F.N deficits, Normal affect [place].AT,PERRAL Supple Neck,No JVD, No cervical lymphadenopathy appriciated.  Symmetrical Chest wall movement, Good air movement bilaterally, CTAB RRR,No Gallops,Rubs or new Murmurs, No Parasternal Heave +ve B.Sounds, Abd Soft, Non tender, No organomegaly appriciated, No rebound -guarding or rigidity. No Cyanosis, Clubbing or edema, No new Rash or bruise  Data Review   CBC w Diff: Lab Results  Component Value Date   WBC 2.5* 07/06/2015   WBC 7.7 12/15/2014   HGB 7.7* 07/06/2015   HGB 10.4* 12/15/2014   HCT 25.4* 07/06/2015   HCT 31.1* 12/15/2014   PLT 136* 07/06/2015   PLT 61* 12/15/2014   LYMPHOPCT 22 07/04/2015   LYMPHOPCT 17.2 12/15/2014   MONOPCT 24 07/04/2015   MONOPCT 7.0 12/15/2014   EOSPCT 1 07/04/2015   EOSPCT 0.4 12/15/2014   BASOPCT 1 07/04/2015   BASOPCT 0.2 12/15/2014    CMP: Lab Results  Component Value Date   NA 142 07/05/2015   NA 143 12/15/2014   K 3.8 07/05/2015   K 3.5 12/15/2014   CL 110 07/05/2015   CL 112*  12/15/2014   CO2 28 07/05/2015   CO2 25 12/15/2014   BUN 14 07/05/2015   BUN 15 12/15/2014   CREATININE 0.70 07/05/2015   CREATININE 0.67 12/15/2014   PROT 6.0* 07/04/2015   PROT 6.1* 12/12/2014   ALBUMIN 3.1* 07/04/2015   ALBUMIN 3.0* 12/12/2014   BILITOT 1.3* 07/04/2015   BILITOT 0.4 12/12/2014   ALKPHOS 47 07/04/2015   ALKPHOS 116 12/12/2014   AST 49* 07/04/2015   AST 41* 12/12/2014   ALT 44 07/04/2015   ALT 60 12/12/2014  .   Total Time in preparing paper work, data evaluation and todays exam - 35 minutes  Susan Shepherd M.D on 07/06/2015 at 9:48 AM

## 2015-07-06 NOTE — Care Management Important Message (Signed)
Important Message  Patient Details  Name: Kamilla StenIrini [id number] Date of Birth: 1951/02/16   Medicare Important Message Given:  Yes-second notification given    Olegario Messier A Allmond 07/06/2015, 11:06 AM

## 2015-07-06 NOTE — Progress Notes (Addendum)
Subjective: The patient notes that she had a good night and that the pain already is much improved as compared to yesterday. She has not yet tried to work on self-catheterization or bowel regimen techniques.   Objective: Vital signs in last 24 hours: Temp:  [98 F (36.7 C)-98.6 F (37 C)] 98.2 F (36.8 C) (08/22 0729) Pulse Rate:  [74-86] 84 (08/22 0729) Resp:  [16-18] 16 (08/22 0729) BP: (96-152)/(60-92) 152/92 mmHg (08/22 0729) SpO2:  [95 %-98 %] 95 % (08/22 0729)  Intake/Output from previous day: 08/21 0701 - 08/22 0700 In: 720 [P.O.:720] Out: 1600 [Urine:1600] Intake/Output this shift:     Recent Labs  07/04/15 1943 07/05/15 0350 07/06/15 0301  HGB 8.7* 7.6* 7.7*    Recent Labs  07/05/15 0350 07/06/15 0301  WBC 3.0* 2.5*  RBC 3.18* 3.20*  HCT 25.0* 25.4*  PLT 132* 136*    Recent Labs  07/04/15 1943 07/05/15 0350  NA 140 142  K 4.6 3.8  CL 108 110  CO2 23 28  BUN 17 14  CREATININE 0.62 0.70  GLUCOSE 87 106*  CALCIUM 8.5* 7.9*    Recent Labs  07/05/15 0350 07/06/15 0301  INR 3.24 4.12*    Physical Exam: Orthopedic examination of both legs is unchanged as compared to yesterday. On the right, she still has mild firmness in the anterior compartment, but is not truly tense and the pain already is much improved as compared to yesterday. She still has more moderate tenderness to palpation over the proximal tibial fracture directly. On the left, she again has moderate tenderness to palpation over the patella. Neurologic status is unchanged in both lower extremities.  Assessment: 1. Status post right proximal tib-fib fracture. 2. Status post left patella fracture with proximal tibia fracture.  Plan: The treatment options are reviewed with the patient. She will continue to wear her bilateral knee immobilizers. She may get out of bed to the chair today, so long as she remains nonweightbearing on her legs and great care is taken to avoid twisting or bending  her legs during the transfers. She should start to work on self-catheterization and bowel regimen techniques as she was doing at home to see how she can manage so that the determination can be made as whether she can go home or whether she needs to go to rehabilitation. The patient is anemic, but asymptomatic at this time, so I will leave it up to medicine as to whether they feel any transfusion is necessary. According to the patient's primary care provider, she usually runs her H&H around 12 and 36, respectively.   Excell Seltzer Poggi 07/06/2015, 7:47 AM   Of note, her INR is over 4.0 today. This needs to be brought down below 3.0, or otherwise she will develop compartment syndrome. Thank you!

## 2015-07-07 LAB — GLUCOSE, CAPILLARY: Glucose-Capillary: 92 mg/dL (ref 65–99)

## 2015-07-07 LAB — PROTIME-INR
INR: 2.75
PROTHROMBIN TIME: 29.2 s — AB (ref 11.4–15.0)

## 2015-07-07 MED ORDER — WARFARIN SODIUM 2 MG PO TABS
2.0000 mg | ORAL_TABLET | Freq: Every day | ORAL | Status: DC
  Filled 2015-07-07: qty 1

## 2015-07-07 MED ORDER — WARFARIN SODIUM 2 MG PO TABS: 2.0000 mg | ORAL_TABLET | Freq: Every day | ORAL | Status: AC

## 2015-07-07 NOTE — Discharge Summary (Signed)
Susan Shepherd, is a 64 y.o. female  DOB [birth year]  MRN [id number].  Admission date:  07/04/2015  Admitting Physician  Oralia Manis, MD  Discharge Date:  07/07/2015   Primary MD  No primary care provider on file.  Recommendations for primary care physician for things to follow:   Follow-up with Dr. Leron Croak  in 1 week.   Admission Diagnosis  Patellar fracture, left, closed, initial encounter [S82.002A] Patellar fracture, right, closed, initial encounter [S82.001A] Bilateral tibial fractures, closed, initial encounter [S82.201A, S82.202A]   Discharge Diagnosis  Patellar fracture, left, closed, initial encounter [S82.002A] Patellar fracture, right, closed, initial encounter [S82.001A] Bilateral tibial fractures, closed, initial encounter [S82.201A, S82.202A]    Principal Problem:   Fracture of tibia with fibula, right, closed Active Problems:   Neuromyelitis optica   Essential hypertension   Diabetes mellitus type 2, controlled   Urinary retention   Patellar fracture   Fracture of tibia, proximal, left, closed   History of pulmonary embolism   UTI (lower urinary tract infection)      Past Medical History  Diagnosis Date  . Neuromuscular disorder   . Neuromyelitis optica   . Diabetes mellitus without complication   . Hypertension   . Paraplegia   . Sjogren's syndrome   . PE (pulmonary embolism)   . Chronic paraplegia     Past Surgical History  Procedure Laterality Date  . Eye surgery    . Pulmonary embolism surgery    . Stomach surgery    . 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 X-rays in the emergency room revealed showed right fibula fracture fibula fracture, left patellar fracture.  Hospital Course  #1 fracture of tibia and fibula on the right side closed fracture, colon patient to has nondisplaced right proximal and tibial fracture. Patient is nonambulatory because of paraplegia. Orthopedics felt that based the contractures does not need any surgical interventions. Patient is placed on immobilizer on both sides. Advised to have nonweightbearing on both legs. The patient does self catheter because of neurogenic bladder but normal because of her recent fractures she may need Foley catheter and needs to go to rehabilitation before she goes home. Patient is going to provide a banana today. And patient is agreeable for that. #2 history of PE on Coumadin; INR  Is  4  today so I decreased the dose of Coumadin and the patient will go  To NH  on Coumadin 6 mg daily instead 10 mg daily. She needs to have INR checked within 1-2 days and adjust the Coumadin.  3.History of forearm neuromyelitis optica patient ; bound and paraplegic continue with  Azathiprine  tizanidine for muscle spasms 4. Diabetes ;type II continue home medications Anemia of chronic disease, probably immunosuppression with Azathiprine ;no indication for transfusion, #5 UTIs ;Cipro has been changed to Levaquin secondary to interaction with the Cipro and the tizanidine. DisCharge canceled yyesterday due to  secondary to constipation, elevated INR, bilateral knee pain. Today INR is down to 2.7, constipation resolved, patient received Foley catheter  yesterday secondary to bilaterally the knee pains and fractures she will be getting Foley catheter for some time and after thepain gets better she will do in and  out.cath.pt agreeable to it, Discharge Condition: Stable   Follow UP      Follow-up Information    Follow up with Christena Flake, MD On 07/13/2015.   Specialty:  Surgery   Why:  Appointment is at 11:00   Contact information:   [address] [place] [address] [phone]         Discharge Instructions  and  Discharge Medications       Medication List    STOP taking these medications        ciprofloxacin 500 MG tablet  Commonly known as:  CIPRO      TAKE these medications        acetaminophen 325 MG tablet  Commonly known as:  TYLENOL  Take 650 mg by mouth daily as needed.     alendronate 70 MG tablet  Commonly known as:  FOSAMAX  Take 70 mg by mouth once a week. Saturday. Take with a full glass of water on an empty stomach.     azaTHIOprine 50 MG tablet  Commonly known as:  IMURAN  Take 100 mg by mouth 2 (two) times daily.     baclofen 10 MG tablet  Commonly known as:  LIORESAL  Take 10 mg by mouth 3 (three) times daily.     calcium carbonate 1250 (500 CA) MG tablet  Commonly known as:  OS-CAL - dosed in mg of elemental calcium  Take 1 tablet by mouth daily with breakfast.     carvedilol 3.125 MG tablet  Commonly known as:  COREG  Take 6.25 mg by mouth 2 (two) times daily with a meal.     cholecalciferol 1000 UNITS tablet  Commonly known as:  VITAMIN D  Take 1,000 Units by mouth daily.     EPINEPHrine 0.3 mg/0.3 mL Soaj injection  Commonly known as:  EPI-PEN  Inject 0.3 mg into the muscle once.     gabapentin 400 MG capsule  Commonly known as:  NEURONTIN  Take 400 mg by mouth 2 (two) times daily. At 9:am and noon     gabapentin 300 MG capsule  Commonly known as:  NEURONTIN  Take 600 mg by mouth at bedtime.     HYDROcodone-acetaminophen 5-325 MG per tablet  Commonly known as:  NORCO/VICODIN  Take 1 tablet by mouth 3 (three) times daily.     HYDROcodone-acetaminophen 5-325 MG per tablet  Commonly known as:  NORCO/VICODIN  Take  1-2 tablets by mouth every 4 (four) hours as needed for moderate pain.     levofloxacin 250 MG tablet  Commonly known as:  LEVAQUIN  Take 1 tablet (250 mg total) by mouth daily.     metFORMIN 500 MG tablet  Commonly known as:  GLUCOPHAGE  Take 500 mg by mouth daily with breakfast.     mirabegron ER 50 MG Tb24 tablet  Commonly known as:  MYRBETRIQ  Take 50 mg by mouth at bedtime.     omeprazole 40 MG capsule  Commonly known as:  PRILOSEC  Take 40 mg by mouth daily.     predniSONE 10 MG tablet  Commonly known as:  DELTASONE  Take 10 mg by mouth daily with breakfast.     senna 8.6 MG Tabs tablet  Commonly known as:  SENOKOT  Take 2 tablets by mouth at bedtime  as needed for mild constipation or moderate constipation.     solifenacin 10 MG tablet  Commonly known as:  VESICARE  Take 10 mg by mouth daily.     tiZANidine 2 MG tablet  Commonly known as:  ZANAFLEX  Take 2 mg by mouth 3 (three) times daily. Take 6mg  at 8, and noon, then 4mg  at 6:pm     vitamin C 500 MG tablet  Commonly known as:  ASCORBIC ACID  Take 500 mg by mouth 2 (two) times daily.     warfarin 10 MG tablet  Commonly known as:  COUMADIN  Take 0.5 tablets (5 mg total) by mouth every Saturday at 6 PM.          Diet and Activity recommendation: See Discharge Instructions above   Consults obtained - ortho   Major procedures and Radiology Reports - PLEASE review detailed and final reports for all details, in brief -      Dg Tibia/fibula Left  07/04/2015   CLINICAL DATA:  Status post fall with pain  EXAM: LEFT TIBIA AND FIBULA - 2 VIEW  COMPARISON:  None.  FINDINGS: There is displaced fracture of the patella. There is subtle curvilinear lucency in the proximal tibial shaft suspicious for nondisplaced fracture.  IMPRESSION: There is displaced fracture of the patella. There is subtle curvilinear lucency in the proximal tibial shaft suspicious for nondisplaced fracture.   Electronically Signed   By:  Sherian Rein M.D.   On: 07/04/2015 18:12   Dg Tibia/fibula Right  07/04/2015   CLINICAL DATA:  Fall.  Fracture.  EXAM: RIGHT TIBIA AND FIBULA - 2 VIEW  COMPARISON:  Knee films same day.  FINDINGS: There is an obliquely oriented proximal tibial metaphysis fracture. 1 cortex width lateral displacement of the tibia of measuring 5 mm. There is also a fibular neck fracture which is mildly displaced. Diffuse subcutaneous edema is present in the leg. Osteopenia. There appears to be synostosis in the distal leg from previous trauma.  IMPRESSION: Proximal tibial and fibular metaphysis fractures with mild displacement. Diffuse osteopenia, likely secondary to disuse.   Electronically Signed   By: Andreas Newport M.D.   On: 07/04/2015 18:12   Dg Chest Portable 1 View  07/04/2015   CLINICAL DATA:  Patient with fever. No chest pain or shortness of breath.  EXAM: PORTABLE CHEST - 1 VIEW  COMPARISON:  Chest radiograph 06/10/2011  FINDINGS: Marked cardiomegaly, increased from prior. Scattered linear opacities within the left mid and right lower lung. No pleural effusion or pneumothorax. Regional skeleton is unremarkable.  IMPRESSION: Increased cardiomegaly.  Linear opacity within the left midlung is nonspecific however may represent infection, atelectasis and/or scarring. Short-term radiographic followup is recommended to ensure resolution and exclude the possibility of underlying mass at this location.   Electronically Signed   By: Annia Belt M.D.   On: 07/04/2015 20:32   Dg Knee Ap/lat W/sunrise Left  07/04/2015   CLINICAL DATA:  Status post fall yesterday with knee pain.  EXAM: LEFT KNEE 3 VIEWS  COMPARISON:  None.  FINDINGS: There is fracture of the patella, minimally displaced. There is curvilinear lucency projected in the proximal tibia seen on the lateral view. Further evaluation with dedicated tibia and fibular film is suggested.  IMPRESSION: Fracture of the patella.  There is curvilinear lucency projected in the  proximal tibia seen on the lateral view. Further evaluation with dedicated tibia and fibular film is suggested.   Electronically Signed   By: Sherian Rein  M.D.   On: 07/04/2015 18:08   Dg Knee Ap/lat W/sunrise Right  07/04/2015   CLINICAL DATA:  Status post fall with knee pain  EXAM: RIGHT KNEE 3 VIEWS  COMPARISON:  None.  FINDINGS: There is displaced fracture of the proximal tibia. There is comminuted displaced fracture proximal fibula. There is a suprapatellar effusion.  IMPRESSION: Fractures of the right tibia and fibula.   Electronically Signed   By: Sherian Rein M.D.   On: 07/04/2015 18:09    Micro Results    Recent Results (from the past 240 hour(s))  Urine culture     Status: None   Collection Time: 07/04/15  7:43 PM  Result Value Ref Range Status   Specimen Description URINE, CLEAN CATCH  Final   Special Requests Normal  Final   Culture NO GROWTH 1 DAY  Final   Report Status 07/06/2015 FINAL  Final       Today   Subjective:   Myya Godsey today has no headache,no chest abdominal pain,no new weakness tingling or numbness, feels much better wants to go home today.   Objective:   Blood pressure 146/98, pulse 82, temperature 98.5 F (36.9 C), temperature source Oral, resp. rate 18, height 5\' 3"  (1.6 m), weight 92.443 kg (203 lb 12.8 oz), SpO2 98 %.   Intake/Output Summary (Last 24 hours) at 07/07/15 1006 Last data filed at 07/07/15 0735  Gross per 24 hour  Intake    120 ml  Output   2125 ml  Net  -2005 ml    Exam Awake Alert, Oriented x 3, No new F.N deficits, Normal affect [place].AT,PERRAL Supple Neck,No JVD, No cervical lymphadenopathy appriciated.  Symmetrical Chest wall movement, Good air movement bilaterally, CTAB RRR,No Gallops,Rubs or new Murmurs, No Parasternal Heave +ve B.Sounds, Abd Soft, Non tender, No organomegaly appriciated, No rebound -guarding or rigidity. No Cyanosis, Clubbing or edema, No new Rash or bruise  Data Review   CBC w Diff:  Lab  Results  Component Value Date   WBC 2.5* 07/06/2015   WBC 7.7 12/15/2014   HGB 7.7* 07/06/2015   HGB 10.4* 12/15/2014   HCT 25.4* 07/06/2015   HCT 31.1* 12/15/2014   PLT 136* 07/06/2015   PLT 61* 12/15/2014   LYMPHOPCT 22 07/04/2015   LYMPHOPCT 17.2 12/15/2014   MONOPCT 24 07/04/2015   MONOPCT 7.0 12/15/2014   EOSPCT 1 07/04/2015   EOSPCT 0.4 12/15/2014   BASOPCT 1 07/04/2015   BASOPCT 0.2 12/15/2014    CMP:  Lab Results  Component Value Date   NA 142 07/05/2015   NA 143 12/15/2014   K 3.8 07/05/2015   K 3.5 12/15/2014   CL 110 07/05/2015   CL 112* 12/15/2014   CO2 28 07/05/2015   CO2 25 12/15/2014   BUN 14 07/05/2015   BUN 15 12/15/2014   CREATININE 0.70 07/05/2015   CREATININE 0.67 12/15/2014   PROT 6.0* 07/04/2015   PROT 6.1* 12/12/2014   ALBUMIN 3.1* 07/04/2015   ALBUMIN 3.0* 12/12/2014   BILITOT 1.3* 07/04/2015   BILITOT 0.4 12/12/2014   ALKPHOS 47 07/04/2015   ALKPHOS 116 12/12/2014   AST 49* 07/04/2015   AST 41* 12/12/2014   ALT 44 07/04/2015   ALT 60 12/12/2014  .   Total Time in preparing paper work, data evaluation and todays exam - 35 minutes  Khyran Riera M.D on 07/07/2015 at 10:06 AM

## 2015-07-07 NOTE — Progress Notes (Signed)
Subjective :Patient is Post OP day # 1  Status post essentially nondisplaced right proximal tibia fibular fractures in nonambulatory paraplegic patient.                                                 Status post essentially nondisplaced patella fracture and nondisplaced proximal tibial metaphyseal fracture in nonambulatory paraplegic patient  Patient reports pain as none.   no nausea and no vomiting no chest pain or sob Resting well.   Objective: Vital signs in last 24 hours: Temp:  [98 F (36.7 C)-98.6 F (37 C)] 98.5 F (36.9 C) (08/23 0716) Pulse Rate:  [72-87] 82 (08/23 0716) Resp:  [18] 18 (08/23 0716) BP: (122-166)/(77-98) 146/98 mmHg (08/23 0716) SpO2:  [97 %-98 %] 98 % (08/23 0716) Heels elevated off bed Foot warm and dry.     Intake/Output from previous day: 08/22 0701 - 08/23 0700 In: 360 [P.O.:360] Out: 2125 [Urine:2125] Intake/Output this shift:     Recent Labs  07/04/15 1943 07/05/15 0350 07/06/15 0301  HGB 8.7* 7.6* 7.7*    Recent Labs  07/05/15 0350 07/06/15 0301  WBC 3.0* 2.5*  RBC 3.18* 3.20*  HCT 25.0* 25.4*  PLT 132* 136*    Recent Labs  07/04/15 1943 07/05/15 0350  NA 140 142  K 4.6 3.8  CL 108 110  CO2 23 28  BUN 17 14  CREATININE 0.62 0.70  GLUCOSE 87 106*  CALCIUM 8.5* 7.9*    Recent Labs  07/06/15 0301 07/07/15 0538  INR 4.12* 2.75    KI in place. parapalegic bilateral lower ext.  Assessment/Plan: Continue KI at all times. NWB bilateral lower ext Bed to chair transfer Case management to assist with discharge planning Physical therapy today Bowel movement today Plan to discharge when medically clear. Probably today F/u in [facility] ortho in one week.   WOLFE,JON R. 07/07/2015, 7:37 AM

## 2015-07-07 NOTE — Progress Notes (Signed)
Discharge note:  Patient discharged to [facility]. Report called to [place] at facility. EMS called, waiting on transportation. [facility] to [facility] before [facility]. Greer Ee

## 2015-07-07 NOTE — Progress Notes (Signed)
Patient is medically stable for D/C to [facility] today. Clinical Child psychotherapist (CSW) contacted [facility] Child psychotherapist. Per Carollee Herter EMS [facility] transport [facility] to [facility] then to [facility]. Per Gavin Pound admissions coordinator at [facility] patient is going to room 316-A. RN will call report to [facility]. Gavin Pound is aware that patient will go to the [facility] facility first. CSW prepared D/C packet and sent D/C Summary to Gavin Pound and gave D/C Summary to [facility] RN that was at the hospital. Patient is aware of above. Patient reported that her family is aware of D/C. Please reconsult if future social work needs arise. CSW signing off.   Jetta Lout, LCSWA [phone]

## 2015-07-07 NOTE — Progress Notes (Signed)
Pt. Alert and oriented. VSS. Pain in legs bilateral controlled with PO pain meds. Foley patent. Pills whole with water. Turned q2h. For d/c to [facility] today. Pt. Resting quietly.

## 2015-07-07 NOTE — Clinical Social Work Placement (Signed)
   CLINICAL SOCIAL WORK PLACEMENT  NOTE  Date:  07/07/2015  Patient Details  Name: Susan Shepherd MRN: [id number] Date of Birth: 06-Jul-1951  Clinical Social Work is seeking post-discharge placement for this patient at the Skilled  Nursing Facility level of care (*CSW will initial, date and re-position this form in  chart as items are completed):  Yes   Patient/family provided with [place] Clinical Social Work Department's list of facilities offering this level of care within the geographic area requested by the patient (or if unable, by the patient's family).  Yes   Patient/family informed of their freedom to choose among providers that offer the needed level of care, that participate in Medicare, Medicaid or managed care program needed by the patient, have an available bed and are willing to accept the patient.  Yes   Patient/family informed of [place]'s ownership interest in [facility] and [facility], as well as of the fact that they are under no obligation to receive care at these facilities.  PASRR submitted to EDS on       PASRR number received on       Existing PASRR number confirmed on 07/06/15     FL2 transmitted to all facilities in geographic area requested by pt/family on 07/06/15     FL2 transmitted to all facilities within larger geographic area on 07/06/15     Patient informed that his/her managed care company has contracts with or will negotiate with certain facilities, including the following:        Yes   Patient/family informed of bed offers received.  Patient chooses bed at  [facility])     Physician recommends and patient chooses bed at      Patient to be transferred to  [facility]) on 07/07/15.  Patient to be transferred to facility by  [facility] )     Patient family notified on 07/07/15 of transfer.  Name of family member notified:   (Patient reported that her family is aware of D/C. )      PHYSICIAN       Additional Comment:    _______________________________________________ Haig Prophet, LCSW 07/07/2015, 10:38 AM

## 2015-07-07 NOTE — Progress Notes (Signed)
ANTICOAGULATION CONSULT NOTE -Follow up Consult  Pharmacy Consult for warfarin Indication: history of pulmonary embolus  Allergies  Allergen Reactions  . Bee Venom Anaphylaxis  . Tocilizumab     Patient Measurements: Height:  (160 cm) Weight: 203 lb 12.8 oz (92.443 kg) IBW/kg (Calculated) : 52.4 Heparin Dosing Weight:   Vital Signs: Temp: 98.6 F (37 C) (08/23 0501) Temp Source: Oral (08/23 0501) BP: 161/90 mmHg (08/23 0502) Pulse Rate: 72 (08/23 0502)  Labs:  Recent Labs  07/04/15 1943 07/05/15 0350 07/05/15 0956 07/06/15 0301 07/07/15 0538  HGB 8.7* 7.6*  --  7.7*  --   HCT 27.8* 25.0*  --  25.4*  --   PLT 174 132*  --  136*  --   APTT 78*  --   --   --   --   LABPROT 29.4* 33.1*  --  39.8* 29.2*  INR 2.78 3.24  --  4.12* 2.75  CREATININE 0.62 0.70  --   --   --   CKTOTAL  --   --  60  --   --     Estimated Creatinine Clearance: 77.7 mL/min (by C-G formula based on Cr of 0.7).   Medical History: Past Medical History  Diagnosis Date  . Neuromuscular disorder   . Neuromyelitis optica   . Diabetes mellitus without complication   . Hypertension   . Paraplegia   . Sjogren's syndrome   . PE (pulmonary embolism)   . Chronic paraplegia     Medications:  Infusions:     Assessment: 63 yof cc fall at home with bilateral patellar and left tibial/right tib-fib fractures. History of PE and paraplegia. Recently started Ciprofloxacin for UTI (started 07/03/15). She reports she already took her Coumadin 5 mg dose today 8/20, INR at 1943 was 2.78.  Goal of Therapy:  INR 2-3 Monitor platelets by anticoagulation protocol: Yes  8/20:  Coumadin 5 mg (at home) 8/21: Hold dose   Plan:  8/20: INR 2.78, recently started ciprofloxacin, will empirically reduce dose to 2 mg po on Sunday 07/05/15 and recheck INR on Monday 07/06/15.  8/21: INR= 3.24. Will hold dose today. Anticipate resuming on 8/22 Monday with lower dose if INR 2.0-3.0. Drug interaction with Cipro.  Recheck INR in am  08/22: INR 4.12. Will continue to hold. Recheck INR in AM.   8/23 INR 2.75. Start 2 mg dose today. INR in AM.  Haydn Cush S, Pharm.D. Clinical Pharmacist 07/07/2015,6:48 AM

## 2016-01-13 DEATH — deceased
